# Patient Record
Sex: Male | Born: 1945 | Race: White | Hispanic: No | Marital: Married | Smoking: Current every day smoker
Health system: Southern US, Community
[De-identification: ages and names within clinical notes are randomized; demographics above are authoritative.]

## PROBLEM LIST (undated history)

## (undated) DIAGNOSIS — E785 Hyperlipidemia, unspecified: Secondary | ICD-10-CM

## (undated) DIAGNOSIS — I82409 Acute embolism and thrombosis of unspecified deep veins of unspecified lower extremity: Secondary | ICD-10-CM

## (undated) DIAGNOSIS — I10 Essential (primary) hypertension: Secondary | ICD-10-CM

## (undated) HISTORY — DX: Essential (primary) hypertension: I10

## (undated) HISTORY — DX: Acute embolism and thrombosis of unspecified deep veins of unspecified lower extremity: I82.409

## (undated) HISTORY — DX: Hyperlipidemia, unspecified: E78.5

---

## 1998-10-24 ENCOUNTER — Emergency Department (HOSPITAL_COMMUNITY): Admission: EM | Admit: 1998-10-24 | Discharge: 1998-10-24 | Payer: Self-pay

## 1998-10-31 ENCOUNTER — Emergency Department (HOSPITAL_COMMUNITY): Admission: EM | Admit: 1998-10-31 | Discharge: 1998-10-31 | Payer: Self-pay | Admitting: Emergency Medicine

## 2006-03-28 ENCOUNTER — Ambulatory Visit: Payer: Self-pay | Admitting: Family Medicine

## 2011-06-02 ENCOUNTER — Ambulatory Visit
Admission: RE | Admit: 2011-06-02 | Discharge: 2011-06-02 | Disposition: A | Discharge status: Home / Self Care | Payer: Medicare Other | Source: Ambulatory Visit | Attending: Family Medicine | Admitting: Family Medicine

## 2011-06-02 ENCOUNTER — Other Ambulatory Visit: Payer: Self-pay | Admitting: Family Medicine

## 2011-06-02 DIAGNOSIS — M79669 Pain in unspecified lower leg: Secondary | ICD-10-CM

## 2011-10-13 ENCOUNTER — Other Ambulatory Visit: Payer: Self-pay | Admitting: Family Medicine

## 2011-10-13 DIAGNOSIS — I824Z9 Acute embolism and thrombosis of unspecified deep veins of unspecified distal lower extremity: Secondary | ICD-10-CM

## 2011-10-25 ENCOUNTER — Ambulatory Visit
Admission: RE | Admit: 2011-10-25 | Discharge: 2011-10-25 | Disposition: A | Discharge status: Home / Self Care | Payer: Medicare Other | Source: Ambulatory Visit | Attending: Family Medicine | Admitting: Family Medicine

## 2011-10-25 DIAGNOSIS — I824Z9 Acute embolism and thrombosis of unspecified deep veins of unspecified distal lower extremity: Secondary | ICD-10-CM

## 2012-09-21 ENCOUNTER — Telehealth: Payer: Self-pay

## 2012-09-21 NOTE — Telephone Encounter (Signed)
BETH FROM DR LITTLE'S OFFICE WILL CALL PT WITH APPT.

## 2012-10-04 ENCOUNTER — Telehealth: Payer: Self-pay | Admitting: *Deleted

## 2012-10-04 ENCOUNTER — Telehealth: Payer: Self-pay | Admitting: Hematology and Oncology

## 2012-10-04 ENCOUNTER — Other Ambulatory Visit (HOSPITAL_BASED_OUTPATIENT_CLINIC_OR_DEPARTMENT_OTHER): Payer: Medicare Other | Admitting: Lab

## 2012-10-04 ENCOUNTER — Ambulatory Visit (HOSPITAL_BASED_OUTPATIENT_CLINIC_OR_DEPARTMENT_OTHER): Payer: Medicare Other | Admitting: Hematology and Oncology

## 2012-10-04 ENCOUNTER — Ambulatory Visit: Payer: Medicare Other

## 2012-10-04 ENCOUNTER — Encounter: Payer: Self-pay | Admitting: Hematology and Oncology

## 2012-10-04 VITALS — BP 153/84 | HR 81 | Temp 97.0°F | Resp 18 | Ht 66.0 in | Wt 130.0 lb

## 2012-10-04 DIAGNOSIS — Z87891 Personal history of nicotine dependence: Secondary | ICD-10-CM

## 2012-10-04 DIAGNOSIS — D45 Polycythemia vera: Secondary | ICD-10-CM

## 2012-10-04 DIAGNOSIS — D751 Secondary polycythemia: Secondary | ICD-10-CM

## 2012-10-04 DIAGNOSIS — Z86718 Personal history of other venous thrombosis and embolism: Secondary | ICD-10-CM

## 2012-10-04 LAB — CBC WITH DIFFERENTIAL/PLATELET
Basophils Absolute: 0.1 10*3/uL (ref 0.0–0.1)
Eosinophils Absolute: 0.3 10*3/uL (ref 0.0–0.5)
HGB: 19.2 g/dL — ABNORMAL HIGH (ref 13.0–17.1)
MCV: 99.7 fL — ABNORMAL HIGH (ref 79.3–98.0)
MONO%: 6.1 % (ref 0.0–14.0)
NEUT#: 5.5 10*3/uL (ref 1.5–6.5)
RDW: 14 % (ref 11.0–14.6)
lymph#: 2.8 10*3/uL (ref 0.9–3.3)

## 2012-10-04 LAB — URINALYSIS, MICROSCOPIC - CHCC
Glucose: NEGATIVE mg/dL
Ketones: NEGATIVE mg/dL
RBC / HPF: NEGATIVE (ref 0–2)
Specific Gravity, Urine: 1.005 (ref 1.003–1.035)
WBC, UA: NEGATIVE (ref 0–2)

## 2012-10-04 NOTE — Progress Notes (Signed)
Checked in new patient. No financial issues. Wants phone and mail only. Didn't ask if living will/POV.

## 2012-10-04 NOTE — Telephone Encounter (Signed)
Per staff message and POF I have scheduled appts.  Isaiah York  

## 2012-10-04 NOTE — Telephone Encounter (Signed)
gv and printed appt sched and avs for pt....emailed MW to add tx...will call pt with d/t

## 2012-10-04 NOTE — Telephone Encounter (Signed)
lvm for pt regarding to 8.12.14 d/t for appt.

## 2012-10-05 LAB — ERYTHROPOIETIN: Erythropoietin: 3.1 m[IU]/mL (ref 2.6–18.5)

## 2012-10-07 NOTE — Progress Notes (Signed)
ID: Isaiah York OB: 01/19/1946  MR#: [id number]  WUJ#:[id number]     INITIAL HEMATOLOGY CONSULTATION    PCP: Mickie Hillier, MD  Reason for Referral: Polycythemia.   HISTORY OF PRESENT ILLNESS: The patient is a 67 y.o. male who was sent to [place] for evaluation of polycythemia. On 09/14/2012 his hemoglobin was 20.5, hematocrit was 56.8. The patient is smoker. He smokes 1/2 PPD for 54 years. He has good appetite. His weight is stable He reported history headaches but when he started to control blood pressure it is gone. The patient denied fever, chills, night sweats, change in appetite or weight.He denied headaches since his blood pressure under control, double vision, blurry vision, nasal congestion, nasal discharge, hearing problems, odynophagia or dysphagia. No chest pain, palpitations, dyspnea, cough, abdominal pain, nausea, vomiting, diarrhea, constipation, hematochezia. The patient denied dysuria, nocturia, polyuria, hematuria, myalgia, numbness, tingling, psychiatric problems.  Review of Systems  Constitutional: Negative for fever, chills, weight loss, malaise/fatigue and diaphoresis.  HENT: Negative for hearing loss, ear pain, nosebleeds, congestion, sore throat, neck pain, tinnitus and ear discharge.   Eyes: Negative for blurred vision, double vision, photophobia and pain.  Respiratory: Negative for cough, hemoptysis, sputum production, shortness of breath, wheezing and stridor.   Cardiovascular: Negative for chest pain, palpitations, orthopnea, claudication, leg swelling and PND.  Gastrointestinal: Negative for heartburn, nausea, vomiting, abdominal pain, diarrhea, constipation, blood in stool and melena.  Genitourinary: Negative for dysuria, urgency, frequency, hematuria and flank pain.  Musculoskeletal: Negative for myalgias, back pain, joint pain and falls.  Skin: Negative for itching and rash.  Neurological: Negative for dizziness, tingling, tremors, sensory change, speech  change, focal weakness, seizures, loss of consciousness, weakness and headaches.  Endo/Heme/Allergies: Negative for polydipsia. Does not bruise/bleed easily.  Psychiatric/Behavioral: Negative for depression.    PAST MEDICAL HISTORY: 1. Hypertension. 2. DVT.  PAST SURGICAL HISTORY: 1. Cataract surgery   FAMILY HISTORY Mother died from lung cancer.. Father had history of CVA. He died from throat cancer.  Sister doing well.  HEALTH MAINTENANCE: Smoking: 1/2 PPD for 54 year. Alcohol: used  to drink 4 beers a day. No drugs.  No Known Allergies  Current Outpatient Prescriptions  Medication Sig Dispense Refill  . amLODipine (NORVASC) 5 MG tablet Take 5 mg by mouth daily.      . folic acid (FOLVITE) 1 MG tablet Take 1 mg by mouth daily.      Marland Kitchen losartan (COZAAR) 50 MG tablet Take 50 mg by mouth daily.      Marland Kitchen warfarin (COUMADIN) 2 MG tablet Take 3 mg by mouth. 1.5 tabs daily except Wednesdays 1 tab.       No current facility-administered medications for this visit.    OBJECTIVE: Filed Vitals:   10/04/12 1100  BP: 153/84  Pulse: 81  Temp: 97 F (36.1 C)  Resp: 18     Body mass index is 20.99 kg/(m^2).    ECOG FS: 0  HEENT: Sclerae anicteric.  Conjunctivae were pink. Pupils round and reactive bilaterally. Oral mucosa is moist without ulceration or thrush. No occipital, submandibular, cervical, supraclavicular or axillar adenopathy. Lungs: clear to auscultation without wheezes. No rales or rhonchi. Heart: regular rate and rhythm. No gallop or rubs. 3/6 early short systolic murmur.  Abdomen: soft, non tender. No guarding or rebound tenderness. Bowel sounds are present. No palpable hepatosplenomegaly. MSK: no focal spinal tenderness. Extremities: No clubbing or cyanosis.No calf tenderness to palpitation, no peripheral edema. The patient had grossly intact strength in upper and  lower extremities Neuro: non-focal, alert and oriented to time, person and place, appropriate affect  LAB  RESULTS:  Lab Results  Component Value Date   WBC 9.2 10/04/2012   NEUTROABS 5.5 10/04/2012   HGB 19.2 Repeated and Verified* 10/04/2012   HCT 57.3* 10/04/2012   MCV 99.7* 10/04/2012   PLT 277 10/04/2012    Urinalysis    Component Value Date/Time   LABSPEC 1.005 10/04/2012 1309   GLUCOSEU Negative 10/04/2012 1309   UROBILINOGEN 0.2 10/04/2012 1309     ASSESSMENT:  1. Polycythemia. 2. Tobacco abuse. 3. DVT.  PLAN: 1. We discussed with patient that his polycythemia can be secondary to smoking and recommended to stop smoking. The patient agree to try stop smoking prior we proceed with further work up for other causes of polycythemia as polycythemia vera. Meantime I recommended to start aspirin 81 mg and phlebotomies. We will check CBC now and in 2 weeks. I will follow patient in 6-8 weeks. 2. 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 I will try to confirm history of multiple DVT.   Myra Rude, MD   10/07/2012 11:51 PM

## 2012-10-16 ENCOUNTER — Ambulatory Visit (HOSPITAL_BASED_OUTPATIENT_CLINIC_OR_DEPARTMENT_OTHER): Payer: Medicare Other

## 2012-10-16 ENCOUNTER — Telehealth: Payer: Self-pay | Admitting: *Deleted

## 2012-10-16 ENCOUNTER — Ambulatory Visit (HOSPITAL_BASED_OUTPATIENT_CLINIC_OR_DEPARTMENT_OTHER): Payer: Medicare Other | Admitting: Lab

## 2012-10-16 VITALS — BP 144/70 | HR 84 | Temp 97.8°F

## 2012-10-16 DIAGNOSIS — D751 Secondary polycythemia: Secondary | ICD-10-CM

## 2012-10-16 DIAGNOSIS — D45 Polycythemia vera: Secondary | ICD-10-CM

## 2012-10-16 LAB — CBC WITH DIFFERENTIAL/PLATELET
Basophils Absolute: 0.1 10*3/uL (ref 0.0–0.1)
Eosinophils Absolute: 0.6 10*3/uL — ABNORMAL HIGH (ref 0.0–0.5)
HGB: 17.3 g/dL — ABNORMAL HIGH (ref 13.0–17.1)
NEUT#: 6.9 10*3/uL — ABNORMAL HIGH (ref 1.5–6.5)
RDW: 13.7 % (ref 11.0–14.6)
WBC: 11.2 10*3/uL — ABNORMAL HIGH (ref 4.0–10.3)
lymph#: 2.8 10*3/uL (ref 0.9–3.3)

## 2012-10-16 NOTE — Telephone Encounter (Signed)
Per chemo RN I have scheduled appt to next week.

## 2012-10-16 NOTE — Patient Instructions (Addendum)
Therapeutic Phlebotomy Therapeutic phlebotomy is the controlled removal of blood from your body for the purpose of treating a medical condition. It is similar to donating blood. Usually, about a pint (470 mL) of blood is removed. The average adult has 9 to 12 pints (4.3 to 5.7 L) of blood. Therapeutic phlebotomy may be used to treat the following medical conditions:  Hemochromatosis. This is a condition in which there is too much iron in the blood.  Polycythemia vera. This is a condition in which there are too many red cells in the blood.  Porphyria cutanea tarda. This is a disease usually passed from one generation to the next (inherited). It is a condition in which an important part of hemoglobin is not made properly. This results in the build up of abnormal amounts of porphyrins in the body.  Sickle cell disease. This is an inherited disease. It is a condition in which the red blood cells form an abnormal crescent shape rather than a round shape. LET YOUR CAREGIVER KNOW ABOUT:  Allergies.  Medicines taken including herbs, eyedrops, over-the-counter medicines, and creams.  Use of steroids (by mouth or creams).  Previous problems with anesthetics or numbing medicine.  History of blood clots.  History of bleeding or blood problems.  Previous surgery.  Possibility of pregnancy, if this applies. RISKS AND COMPLICATIONS This is a simple and safe procedure. Problems are unlikely. However, problems can occur and may include:  Nausea or lightheadedness.  Low blood pressure.  Soreness, bleeding, swelling, or bruising at the needle insertion site.  Infection. BEFORE THE PROCEDURE  This is a procedure that can be done as an outpatient. Confirm the time that you need to arrive for your procedure. Confirm whether there is a need to fast or withhold any medications. It is helpful to wear clothing with sleeves that can be raised above the elbow. A blood sample may be done to determine the  amount of red blood cells or iron in your blood. Plan ahead of time to have someone drive you home after the procedure. PROCEDURE The entire procedure from preparation through recovery takes about 1 hour. The actual collection takes about 10 to 15 minutes.  A needle will be inserted into your vein.  Tubing and a collection bag will be attached to that needle.  Blood will flow through the needle and tubing into the collection bag.  You may be asked to open and close your hand slowly and continuously during the entire collection.  Once the specified amount of blood has been removed from your body, the collection bag and tubing will be clamped.  The needle will be removed.  Pressure will be held on the site of the needle insertion to stop the bleeding. Then a bandage will be placed over the needle insertion site. AFTER THE PROCEDURE  Your recovery will be assessed and monitored. If there are no problems, as an outpatient, you should be able to go home shortly after the procedure.  Document Released: 07/26/2010 Document Revised: 05/16/2011 Document Reviewed: 07/26/2010 [employer] Patient Information 2014 [employer].  

## 2012-10-16 NOTE — Progress Notes (Signed)
Patient has poor venous access.  Right antecubital accessed today for lab.  Patient has significant bruising in this area from injury.  Patient also has significant bruising and large scabs from previous lab draw and tape exposure (per patient) in left arm.. Phlebotomy done today in right forearm only yielded 150cc after 20 minutes.  Patient willing to return next week to complete phlebotomy. Discussed with Nathaniel Man RN with Dr. Alecia Lemming.  Gave patient and dtr. Instructions for good fluid intake today and  before next phlebotomy.  Patient will return next week to finish today's phlebotomy. Patient left with daughter.  He feels fine.

## 2012-10-23 ENCOUNTER — Ambulatory Visit (HOSPITAL_BASED_OUTPATIENT_CLINIC_OR_DEPARTMENT_OTHER): Payer: Medicare Other

## 2012-10-23 VITALS — BP 125/90 | HR 89 | Temp 96.9°F | Resp 20

## 2012-10-23 DIAGNOSIS — D45 Polycythemia vera: Secondary | ICD-10-CM

## 2012-10-23 NOTE — Progress Notes (Signed)
0845 patient states he has not had breakfast this morning prior to phlebotomy; patient currently eating cheese and crackers and drinking juice. VSS. Clayborn Heron, RN

## 2012-10-23 NOTE — Progress Notes (Signed)
VSS patient denies lightheadedness or dizziness; monitored x30 minutes post phlebotomy; food and drink provided; patient ambulated to exit without difficulty. Clayborn Heron, RN

## 2012-10-23 NOTE — Patient Instructions (Addendum)
Therapeutic Phlebotomy Therapeutic phlebotomy is the controlled removal of blood from your body for the purpose of treating a medical condition. It is similar to donating blood. Usually, about a pint (470 mL) of blood is removed. The average adult has 9 to 12 pints (4.3 to 5.7 L) of blood. Therapeutic phlebotomy may be used to treat the following medical conditions:  Hemochromatosis. This is a condition in which there is too much iron in the blood.  Polycythemia vera. This is a condition in which there are too many red cells in the blood.  Porphyria cutanea tarda. This is a disease usually passed from one generation to the next (inherited). It is a condition in which an important part of hemoglobin is not made properly. This results in the build up of abnormal amounts of porphyrins in the body.  Sickle cell disease. This is an inherited disease. It is a condition in which the red blood cells form an abnormal crescent shape rather than a round shape. LET YOUR CAREGIVER KNOW ABOUT:  Allergies.  Medicines taken including herbs, eyedrops, over-the-counter medicines, and creams.  Use of steroids (by mouth or creams).  Previous problems with anesthetics or numbing medicine.  History of blood clots.  History of bleeding or blood problems.  Previous surgery.  Possibility of pregnancy, if this applies. RISKS AND COMPLICATIONS This is a simple and safe procedure. Problems are unlikely. However, problems can occur and may include:  Nausea or lightheadedness.  Low blood pressure.  Soreness, bleeding, swelling, or bruising at the needle insertion site.  Infection. BEFORE THE PROCEDURE  This is a procedure that can be done as an outpatient. Confirm the time that you need to arrive for your procedure. Confirm whether there is a need to fast or withhold any medications. It is helpful to wear clothing with sleeves that can be raised above the elbow. A blood sample may be done to determine the  amount of red blood cells or iron in your blood. Plan ahead of time to have someone drive you home after the procedure. PROCEDURE The entire procedure from preparation through recovery takes about 1 hour. The actual collection takes about 10 to 15 minutes.  A needle will be inserted into your vein.  Tubing and a collection bag will be attached to that needle.  Blood will flow through the needle and tubing into the collection bag.  You may be asked to open and close your hand slowly and continuously during the entire collection.  Once the specified amount of blood has been removed from your body, the collection bag and tubing will be clamped.  The needle will be removed.  Pressure will be held on the site of the needle insertion to stop the bleeding. Then a bandage will be placed over the needle insertion site. AFTER THE PROCEDURE  Your recovery will be assessed and monitored. If there are no problems, as an outpatient, you should be able to go home shortly after the procedure.  Document Released: 07/26/2010 Document Revised: 05/16/2011 Document Reviewed: 07/26/2010 [employer] Patient Information 2014 [employer].  

## 2012-11-08 ENCOUNTER — Telehealth: Payer: Self-pay | Admitting: Internal Medicine

## 2012-11-08 NOTE — Telephone Encounter (Signed)
Moved 9/11 appt to 9/23 w/CP1. S/w dtr she is aware.

## 2012-11-15 ENCOUNTER — Ambulatory Visit: Payer: Medicare Other

## 2012-11-27 ENCOUNTER — Telehealth: Payer: Self-pay | Admitting: *Deleted

## 2012-11-27 ENCOUNTER — Ambulatory Visit (HOSPITAL_BASED_OUTPATIENT_CLINIC_OR_DEPARTMENT_OTHER): Payer: Medicare Other | Admitting: Lab

## 2012-11-27 ENCOUNTER — Encounter: Payer: Self-pay | Admitting: Internal Medicine

## 2012-11-27 ENCOUNTER — Ambulatory Visit (HOSPITAL_BASED_OUTPATIENT_CLINIC_OR_DEPARTMENT_OTHER): Payer: Medicare Other | Admitting: Internal Medicine

## 2012-11-27 VITALS — BP 155/82 | HR 79 | Temp 97.0°F | Resp 20 | Ht 66.0 in | Wt 134.0 lb

## 2012-11-27 DIAGNOSIS — I82401 Acute embolism and thrombosis of unspecified deep veins of right lower extremity: Secondary | ICD-10-CM

## 2012-11-27 DIAGNOSIS — D751 Secondary polycythemia: Secondary | ICD-10-CM

## 2012-11-27 DIAGNOSIS — F17200 Nicotine dependence, unspecified, uncomplicated: Secondary | ICD-10-CM

## 2012-11-27 DIAGNOSIS — Z720 Tobacco use: Secondary | ICD-10-CM

## 2012-11-27 DIAGNOSIS — I82409 Acute embolism and thrombosis of unspecified deep veins of unspecified lower extremity: Secondary | ICD-10-CM

## 2012-11-27 LAB — COMPREHENSIVE METABOLIC PANEL (CC13)
ALT: 14 U/L (ref 0–55)
CO2: 24 mEq/L (ref 22–29)
Calcium: 9.8 mg/dL (ref 8.4–10.4)
Chloride: 105 mEq/L (ref 98–109)
Creatinine: 0.9 mg/dL (ref 0.7–1.3)
Glucose: 132 mg/dl (ref 70–140)
Sodium: 140 mEq/L (ref 136–145)
Total Protein: 8.3 g/dL (ref 6.4–8.3)

## 2012-11-27 LAB — CBC WITH DIFFERENTIAL/PLATELET
BASO%: 0.7 % (ref 0.0–2.0)
Eosinophils Absolute: 0.2 10*3/uL (ref 0.0–0.5)
HCT: 49.3 % (ref 38.4–49.9)
MCHC: 33.5 g/dL (ref 32.0–36.0)
MONO#: 0.8 10*3/uL (ref 0.1–0.9)
NEUT#: 5.7 10*3/uL (ref 1.5–6.5)
NEUT%: 62.9 % (ref 39.0–75.0)
Platelets: 271 10*3/uL (ref 140–400)
RBC: 4.93 10*6/uL (ref 4.20–5.82)
RDW: 14.9 % — ABNORMAL HIGH (ref 11.0–14.6)
WBC: 9 10*3/uL (ref 4.0–10.3)
lymph#: 2.4 10*3/uL (ref 0.9–3.3)

## 2012-11-27 NOTE — Telephone Encounter (Signed)
Per staff message and POF I have scheduled appts.  JMW  

## 2012-11-27 NOTE — Patient Instructions (Signed)
Warfarin tablets What is this medicine? WARFARIN (WAR far in) is an anticoagulant. It is used to treat or prevent clots in the veins, arteries, lungs, or heart. This medicine may be used for other purposes; ask your health care provider or pharmacist if you have questions. What should I tell my health care provider before I take this medicine? They need to know if you have any of these conditions: -alcoholism -anemia -bleeding disorders -cancer -diabetes -heart disease -high blood pressure -history of bleeding in the gastrointestinal tract -history of stroke or other brain injury or disease -kidney or liver disease -protein C deficiency -protein S deficiency -psychosis or dementia -recent injury, recent or planned surgery or procedure -an unusual or allergic reaction to warfarin, other medicines, foods, dyes, or preservatives -pregnant or trying to get pregnant -breast-feeding How should I use this medicine? Take this medicine by mouth with a glass of water. Follow the directions on the prescription label. You can take this medicine with or without food. Take your medicine at the same time each day. Do not take it more often than directed. Do not stop taking except on the advice of your doctor or health care professional. A special MedGuide will be given to you by the pharmacist with each prescription and refill. Be sure to read this information carefully each time. Talk to your pediatrician regarding the use of this medicine in children. Special care may be needed. Overdosage: If you think you have taken too much of this medicine contact a poison control center or emergency room at once. NOTE: This medicine is only for you. Do not share this medicine with others. What if I miss a dose? It is important not to miss a dose. If you miss a dose, call your healthcare provider. Take the dose as soon as possible on the same day. If it is almost time for your next dose, take only that dose. Do  not take double or extra doses to make up for a missed dose. What may interact with this medicine? Do not take this medicine with any of the following medications: -agents that prevent or dissolve blood clots -aspirin or other salicylates -danshen -dextrothyroxine -mifepristone -St. John's Wort -red yeast rice This medicine may also interact with the following medications: -acetaminophen -agents that lower cholesterol -alcohol -allopurinol -amiodarone -antibiotics or medicines for treating bacterial, fungal or viral infections -azathioprine -barbiturate medicines for inducing sleep or treating seizures -certain medicines for diabetes -certain medicines for heart rhythm problems -certain medicines for high blood pressure -chloral hydrate -cisapride -disulfiram -male hormones, including contraceptive or birth control pills -general anesthetics -herbal or dietary products like cranberry, garlic, ginkgo, ginseng, green tea, or kava kava -influenza virus vaccine -male hormones -medicines for mental depression or psychosis -medicines for some types of cancer -medicines for stomach problems -methylphenidate -NSAIDs, medicines for pain and inflammation, like ibuprofen or naproxen -propoxyphene -quinidine, quinine -raloxifene -seizure or epilepsy medicine like carbamazepine, phenytoin, and valproic acid -steroids like cortisone and prednisone -tamoxifen -thyroid medicine -tramadol -vitamin c, vitamin e, and vitamin K -zafirlukast -zileuton This list may not describe all possible interactions. Give your health care provider a list of all the medicines, herbs, non-prescription drugs, or dietary supplements you use. Also tell them if you smoke, drink alcohol, or use illegal drugs. Some items may interact with your medicine. What should I watch for while using this medicine? Visit your doctor or health care professional for regular checks on your progress. You will need to have  your blood   checked regularly to make sure you are getting the right dose of this medicine. When you first start taking this medicine, these tests are done often. Once the correct dose is determined and you take your medicine properly, these tests can be done less often. While you are taking this medicine, carry an identification card with your name, the name and dose of medicine(s) being used, and the name and phone number of your doctor or health care professional or person to contact in an emergency. Do not start taking or stop taking any medicines or over-the-counter medicines except on the advice of your doctor or health care professional. You should discuss your diet with your doctor or health care professional. Do not make major changes in your diet. Many foods contain high amounts of vitamin K, which can interfere with the effect of this medicine. Your doctor or health care professional may want you to limit your intake of foods that contain vitamin K. Some foods that have moderate to high amounts of vitamin K are green leafy vegetables like beet greens, collard greens, endive, kale, mustard greens, spinach, turnip greens, watercress, and certain lettuces like green leaf or romaine. Some other foods that have high to moderate amounts of vitamin K are asparagus, black eye peas, broccoli, brussels sprouts, cabbage, cucumber with peel, okra, peas, parsley, and green onions. This medicine can cause birth defects or bleeding in an unborn child. Women of childbearing age should use effective birth control while taking this medicine. If a woman becomes pregnant while taking this medicine, she should discuss the potential risks and her options with her health care professional. Avoid sports and activities that might cause injury while you are using this medicine. Severe falls or injuries can cause unseen bleeding. Be careful when using sharp tools or knives. Consider using an Neurosurgeon. Take special care  brushing or flossing your teeth. Report any injuries, bruising, or red spots on the skin to your doctor or health care professional. If you have an illness that causes vomiting, diarrhea, or fever for more than a few days, contact your doctor. Also check with your doctor if you are unable to eat for several days. These problems can change the effect of this medicine. Even after you stop taking this medicine, it takes several days before your body recovers its normal ability to clot blood. Ask your doctor or health care professional how long you need to be careful. If you are going to have surgery or dental work, tell your doctor or health care professional that you have been taking this medicine. What side effects may I notice from receiving this medicine? Side effects that you should report to your doctor or health care professional as soon as possible: -back or stomach pain -chest pain or fast or irregular heartbeat -difficulty breathing or talking, wheezing -dizziness -fever or chills -headaches -heavy menstrual bleeding or vaginal bleeding -nausea, vomiting -painful, blue, or purple toes -painful, prolonged erection -signs and symptoms of bleeding such as bloody or black, tarry stools, red or dark-brown urine, spitting up blood or brown material that looks like coffee grounds, red spots on the skin, unusual bruising or bleeding from the eye, gums, or nose -skin rash, itching or skin damage -unusual swelling or sudden weight gain -unusually weak or tired -yellowing of skin or eyes Side effects that usually do not require medical attention (report to your doctor or health care professional if they continue or are bothersome): -diarrhea -unusual hair loss This list may not  describe all possible side effects. Call your doctor for medical advice about side effects. You may report side effects to FDA at 1-800-FDA-1088. Where should I keep my medicine? Keep out of the reach of children. Store  at room temperature between 15 and 30 degrees C (59 and 86 degrees F). Protect from light. Throw away any unused medicine after the expiration date. NOTE: This sheet is a summary. It may not cover all possible information. If you have questions about this medicine, talk to your doctor, pharmacist, or health care provider.  2013, [employer]. (01/20/2011 12:08:27 PM) Polycythemia Vera  Polycythemia Dwana Curd is a condition in which the body makes too many red blood cells and there is no known cause. The red blood cells (erythrocytes) are the cells which carry the oxygen in your blood stream to the cells of your body. Because of the increased red blood cells, the blood becomes thicker and does not circulate as well. It would be similar to your car having oil which is too thick so it cannot start and circulate as well. When the blood is too thick it often causes headaches and dizziness. It may also cause blood clots. Even though the blood clots easier, these patients bleed easier. The bleeding is caused because the blood cells which help stop bleeding (platelets) do not function normally. It occurs in all age groups but is more common in the 72 to 15 year age range. TREATMENT  The treatment of polycythemia vera for many years has been blood removal (phlebotomy) which is similar to blood removal in a blood bank, however this blood is not used for donation. Hydroxyurea is used to supplement phlebotomy. Aspirin is commonly given to thin the blood as long as the patient does not have a problem with bleeding. Other drugs are used based on the progression of the disease. Document Released: 11/16/2000 Document Revised: 05/16/2011 Document Reviewed: 05/23/2008 [facility] Patient Information 2014 [place], [place].

## 2012-11-27 NOTE — Progress Notes (Signed)
[facility] OFFICE PROGRESS NOTE  Mickie Hillier, MD [address] [place] [address]  DIAGNOSIS: Polycythemia, secondary - Plan: CBC with Differential, Comprehensive metabolic panel, CBC with Differential in 1 month, CBC with Differential in 2 months, Comprehensive metabolic panel, [id number]  Tobacco abuse - Plan: CBC with Differential in 1 month, CBC with Differential in 2 months, Comprehensive metabolic panel, [id number]  Acute venous embolism and thrombosis of unspecified deep vessels of lower extremity  DVT, lower extremity, right  Chief Complaint  Patient presents with  . Polycythemia    CURRENT THERAPY:  Phlebotomy to maintain a hemoglobin less than 45%.   INTERVAL HISTORY: STEFAN MARKARIAN 67 y.o. male  who was initially sent to Dr. Roanna Raider on 10/04/2012 for evaluation of polycythemia. Today he is accompanied by his daughter.   On 09/14/2012 his hemoglobin was 20.5, hematocrit was 56.8. The patient is smoker. He smokes 1/2 PPD for 54 years. He has good appetite. His weight is stable He reported history headaches but when he started to control blood pressure it is gone. The patient denied fever, chills, night sweats, change in appetite or weight.He denied headaches since his blood pressure under control, double vision, blurry vision, nasal congestion, nasal discharge, hearing problems, odynophagia or dysphagia. No chest pain, palpitations, dyspnea, cough, abdominal pain, nausea, vomiting, diarrhea, constipation, hematochezia. The patient denied dysuria, nocturia, polyuria, hematuria, myalgia, numbness, tingling, psychiatric problems. He denies pruritus or headaches following his showers.    MEDICAL HISTORY: Past Medical History  Diagnosis Date  . HTN (hypertension)     INTERIM HISTORY: has Polycythemia, secondary; Tobacco abuse; and DVT, lower extremity on his problem list.    ALLERGIES:  is allergic to tape.  MEDICATIONS: has a current medication  list which includes the following prescription(s): amlodipine, folic acid, losartan, and warfarin.  SURGICAL HISTORY: History reviewed. No pertinent past surgical history.  REVIEW OF SYSTEMS:   Constitutional: Denies fevers, chills or abnormal weight loss Eyes: Denies blurriness of vision Ears, nose, mouth, throat, and face: Denies mucositis or sore throat Respiratory: Denies cough, dyspnea or wheezes Cardiovascular: Denies palpitation, chest discomfort or lower extremity swelling Gastrointestinal:  Denies nausea, heartburn or change in bowel habits Skin: Denies abnormal skin rashes Lymphatics: Denies new lymphadenopathy or easy bruising Neurological:Denies numbness, tingling or new weaknesses Behavioral/Psych: Mood is stable, no new changes  All other systems were reviewed with the patient and are negative.  PHYSICAL EXAMINATION: ECOG PERFORMANCE STATUS: 0 - Asymptomatic  Blood pressure 155/82, pulse 79, temperature 97 F (36.1 C), temperature source Oral, resp. rate 20, height 5\' 6"  (1.676 m), weight 134 lb (60.782 kg).  GENERAL:alert, no distress and comfortable elderly who appears his stated age SKIN: skin color, texture, turgor are normal, no rashes or significant lesions EYES: normal, Conjunctiva are pink and non-injected, sclera clear OROPHARYNX:no exudate, no erythema and lips, buccal mucosa, and tongue normal  NECK: supple, thyroid normal size, non-tender, without nodularity LYMPH:  no palpable lymphadenopathy in the cervical, axillary or inguinal LUNGS: clear to auscultation and percussion with normal breathing effort HEART: regular rate & rhythm and no murmurs and no lower extremity edema ABDOMEN:abdomen soft, non-tender and normal bowel sounds Musculoskeletal:no cyanosis of digits and no clubbing  NEURO: alert & oriented x 3 with fluent speech, no focal motor/sensory deficits   LABORATORY DATA: CBC    Component Value Date/Time   WBC 9.0 11/27/2012 1259   RBC 4.93  11/27/2012 1259   HGB 16.6 11/27/2012 1259   HCT 49.3  11/27/2012 1259   PLT 271 11/27/2012 1259   MCV 100.0* 11/27/2012 1259   MCH 33.6* 11/27/2012 1259   MCHC 33.5 11/27/2012 1259   RDW 14.9* 11/27/2012 1259   LYMPHSABS 2.4 11/27/2012 1259   MONOABS 0.8 11/27/2012 1259   EOSABS 0.2 11/27/2012 1259   BASOSABS 0.1 11/27/2012 1259   CMP     Component Value Date/Time   NA 140 11/27/2012 1259   K 4.5 11/27/2012 1259   CO2 24 11/27/2012 1259   GLUCOSE 132 11/27/2012 1259   BUN 10.6 11/27/2012 1259   CREATININE 0.9 11/27/2012 1259   CALCIUM 9.8 11/27/2012 1259   PROT 8.3 11/27/2012 1259   ALBUMIN 4.4 11/27/2012 1259   AST 21 11/27/2012 1259   ALT 14 11/27/2012 1259   ALKPHOS 68 11/27/2012 1259   BILITOT 0.72 11/27/2012 1259    RADIOGRAPHIC STUDIES: RIGHT LOWER EXTREMITY VENOUS DUPLEX ULTRASOUND  (10/25/2011) Technique: Gray-scale sonography with graded compression, as well as color Doppler and duplex ultrasound, were performed to evaluate  the deep venous system of the lower extremity from the level of the common femoral vein through the popliteal and proximal calf veins. Spectral Doppler was utilized to evaluate flow at rest and with distal augmentation maneuvers.  Comparison: 06/02/2011  Findings: Residual echogenic intraluminal thrombus noted in the superficial femoral and popliteal veins. Partially occlusive  thrombus persist at the saphenofemoral junction extending into the great saphenous vein. Slight interval decrease in thrombus burden throughout the right lower extremity deep veins and the superficial saphenous vein. No occlusive DVT demonstrated on today's study.  IMPRESSION:  Residual partially occlusive right femoral popliteal DVT. Some interval improvement.  Residual superficial thrombosis of the great saphenous vein, little interval change.  Original Report Authenticated By: Judie Petit. Ruel Favors, M.D.    ASSESSMENT: 1) Tobacco abuse (longstanding); 2) Polycythemia; 3) Right lower extremity  DVT  PLAN:  1. We discussed with patient that his polycythemia can be secondary to smoking and recommended to stop smoking. The patient agree to reduce the amount smoking. He declined tobacco cessation referral presently.  We will proceed with further work up for other causes of polycythemia as polycythemia vera. JAK testing is pending and his EPO level is normal. He will continue coumadin for treatment for his DVT and phlebotomies prn HCT greater than or equal to 45% q monthly.  We provided a detailed overview of polycythemia and deep venous thrombosis.  A handout was also provided. He will return monthly for CBC and phlebotomies prn and a return office visit in 2 months.    All questions were answered. The patient knows to call the clinic with any problems, questions or concerns. We can certainly see the patient much sooner if necessary.  I spent 20 minutes counseling the patient face to face. The total time spent in the appointment was 40 minutes.    Keiva Dina, MD 11/28/2012 12:37 PM

## 2012-11-28 ENCOUNTER — Encounter: Payer: Self-pay | Admitting: Internal Medicine

## 2012-11-28 ENCOUNTER — Telehealth: Payer: Self-pay | Admitting: Internal Medicine

## 2012-11-28 NOTE — Telephone Encounter (Signed)
Called pt and left message regarding phelebotomy for October and November 2014

## 2012-12-28 ENCOUNTER — Other Ambulatory Visit (HOSPITAL_BASED_OUTPATIENT_CLINIC_OR_DEPARTMENT_OTHER): Payer: Medicare Other | Admitting: Lab

## 2012-12-28 ENCOUNTER — Ambulatory Visit (HOSPITAL_BASED_OUTPATIENT_CLINIC_OR_DEPARTMENT_OTHER): Payer: Medicare Other

## 2012-12-28 VITALS — BP 127/74 | HR 100 | Temp 97.1°F | Resp 20

## 2012-12-28 DIAGNOSIS — D751 Secondary polycythemia: Secondary | ICD-10-CM

## 2012-12-28 DIAGNOSIS — Z720 Tobacco use: Secondary | ICD-10-CM

## 2012-12-28 LAB — CBC WITH DIFFERENTIAL/PLATELET
BASO%: 0.2 % (ref 0.0–2.0)
Eosinophils Absolute: 0.5 10*3/uL (ref 0.0–0.5)
HCT: 47.4 % (ref 38.4–49.9)
HGB: 15.8 g/dL (ref 13.0–17.1)
MCHC: 33.4 g/dL (ref 32.0–36.0)
MCV: 100.2 fL — ABNORMAL HIGH (ref 79.3–98.0)
MONO#: 0.7 10*3/uL (ref 0.1–0.9)
NEUT#: 3.7 10*3/uL (ref 1.5–6.5)
NEUT%: 47.3 % (ref 39.0–75.0)
RBC: 4.73 10*6/uL (ref 4.20–5.82)
RDW: 14.5 % (ref 11.0–14.6)
WBC: 7.8 10*3/uL (ref 4.0–10.3)
lymph#: 2.9 10*3/uL (ref 0.9–3.3)

## 2012-12-28 NOTE — Progress Notes (Signed)
Therapeutic phlebotomy performed over 7 minutes. 504 ccs obtained. Pt tolerated well. Ate soup and drank Sprite after phlebotomy.

## 2012-12-28 NOTE — Patient Instructions (Signed)

## 2013-01-28 ENCOUNTER — Other Ambulatory Visit (HOSPITAL_BASED_OUTPATIENT_CLINIC_OR_DEPARTMENT_OTHER): Payer: Medicare Other | Admitting: Lab

## 2013-01-28 ENCOUNTER — Ambulatory Visit (HOSPITAL_BASED_OUTPATIENT_CLINIC_OR_DEPARTMENT_OTHER): Payer: Medicare Other | Admitting: Internal Medicine

## 2013-01-28 ENCOUNTER — Telehealth: Payer: Self-pay | Admitting: Internal Medicine

## 2013-01-28 ENCOUNTER — Ambulatory Visit (HOSPITAL_BASED_OUTPATIENT_CLINIC_OR_DEPARTMENT_OTHER): Payer: Medicare Other

## 2013-01-28 ENCOUNTER — Encounter: Payer: Self-pay | Admitting: Internal Medicine

## 2013-01-28 VITALS — BP 148/84 | HR 90 | Temp 97.6°F | Resp 18 | Ht 66.0 in | Wt 135.6 lb

## 2013-01-28 VITALS — BP 121/85 | HR 78 | Temp 97.1°F | Resp 20

## 2013-01-28 DIAGNOSIS — D751 Secondary polycythemia: Secondary | ICD-10-CM

## 2013-01-28 DIAGNOSIS — I82401 Acute embolism and thrombosis of unspecified deep veins of right lower extremity: Secondary | ICD-10-CM

## 2013-01-28 DIAGNOSIS — F17200 Nicotine dependence, unspecified, uncomplicated: Secondary | ICD-10-CM

## 2013-01-28 DIAGNOSIS — Z720 Tobacco use: Secondary | ICD-10-CM

## 2013-01-28 DIAGNOSIS — D45 Polycythemia vera: Secondary | ICD-10-CM

## 2013-01-28 DIAGNOSIS — I82409 Acute embolism and thrombosis of unspecified deep veins of unspecified lower extremity: Secondary | ICD-10-CM

## 2013-01-28 LAB — CBC WITH DIFFERENTIAL/PLATELET
BASO%: 0.6 % (ref 0.0–2.0)
EOS%: 1.9 % (ref 0.0–7.0)
HCT: 53.1 % — ABNORMAL HIGH (ref 38.4–49.9)
LYMPH%: 27.5 % (ref 14.0–49.0)
MCH: 34.2 pg — ABNORMAL HIGH (ref 27.2–33.4)
MCHC: 33.3 g/dL (ref 32.0–36.0)
MONO%: 9.9 % (ref 0.0–14.0)
NEUT%: 60.1 % (ref 39.0–75.0)
Platelets: 273 10*3/uL (ref 140–400)
RBC: 5.18 10*6/uL (ref 4.20–5.82)

## 2013-01-28 LAB — COMPREHENSIVE METABOLIC PANEL (CC13)
ALT: 14 U/L (ref 0–55)
AST: 21 U/L (ref 5–34)
Alkaline Phosphatase: 64 U/L (ref 40–150)
Anion Gap: 10 mEq/L (ref 3–11)
Creatinine: 0.9 mg/dL (ref 0.7–1.3)
Total Bilirubin: 0.43 mg/dL (ref 0.20–1.20)
Total Protein: 7.5 g/dL (ref 6.4–8.3)

## 2013-01-28 NOTE — Patient Instructions (Signed)
Therapeutic Phlebotomy Therapeutic phlebotomy is the controlled removal of blood from your body for the purpose of treating a medical condition. It is similar to donating blood. Usually, about a pint (470 mL) of blood is removed. The average adult has 9 to 12 pints (4.3 to 5.7 L) of blood. Therapeutic phlebotomy may be used to treat the following medical conditions:  Hemochromatosis. This is a condition in which there is too much iron in the blood.  Polycythemia vera. This is a condition in which there are too many red cells in the blood.  Porphyria cutanea tarda. This is a disease usually passed from one generation to the next (inherited). It is a condition in which an important part of hemoglobin is not made properly. This results in the build up of abnormal amounts of porphyrins in the body.  Sickle cell disease. This is an inherited disease. It is a condition in which the red blood cells form an abnormal crescent shape rather than a round shape. LET YOUR CAREGIVER KNOW ABOUT:  Allergies.  Medicines taken including herbs, eyedrops, over-the-counter medicines, and creams.  Use of steroids (by mouth or creams).  Previous problems with anesthetics or numbing medicine.  History of blood clots.  History of bleeding or blood problems.  Previous surgery.  Possibility of pregnancy, if this applies. RISKS AND COMPLICATIONS This is a simple and safe procedure. Problems are unlikely. However, problems can occur and may include:  Nausea or lightheadedness.  Low blood pressure.  Soreness, bleeding, swelling, or bruising at the needle insertion site.  Infection. BEFORE THE PROCEDURE  This is a procedure that can be done as an outpatient. Confirm the time that you need to arrive for your procedure. Confirm whether there is a need to fast or withhold any medications. It is helpful to wear clothing with sleeves that can be raised above the elbow. A blood sample may be done to determine the  amount of red blood cells or iron in your blood. Plan ahead of time to have someone drive you home after the procedure. PROCEDURE The entire procedure from preparation through recovery takes about 1 hour. The actual collection takes about 10 to 15 minutes.  A needle will be inserted into your vein.  Tubing and a collection bag will be attached to that needle.  Blood will flow through the needle and tubing into the collection bag.  You may be asked to open and close your hand slowly and continuously during the entire collection.  Once the specified amount of blood has been removed from your body, the collection bag and tubing will be clamped.  The needle will be removed.  Pressure will be held on the site of the needle insertion to stop the bleeding. Then a bandage will be placed over the needle insertion site. AFTER THE PROCEDURE  Your recovery will be assessed and monitored. If there are no problems, as an outpatient, you should be able to go home shortly after the procedure.  Document Released: 07/26/2010 Document Revised: 05/16/2011 Document Reviewed: 07/26/2010 [employer] Patient Information 2014 [employer].  

## 2013-01-28 NOTE — Telephone Encounter (Signed)
gv adn printed appt sched and avs for pt for Dec and Jan 2015  °

## 2013-01-28 NOTE — Progress Notes (Signed)
Phlebotomy performed in right lateral antecubital with 16G needle.  Approximate 50cc of blood obtained.  Blood clotted from needle.  Needle d/c intact, site unremarkable. Phlebotomy restarted in left posterior forearm with 18G x 1.16 in angiocath.  Approximate 241cc of blood obtained and wasted.  Again, blood clotted.  Dr. Rosie Fate notified.  Pt to return on 11/25 for another phlebotomy.  Explanations given to pt and daughter.  Both voiced understanding.  Pt was instructed to increase hydration at home as tolerated.

## 2013-01-29 ENCOUNTER — Ambulatory Visit (HOSPITAL_BASED_OUTPATIENT_CLINIC_OR_DEPARTMENT_OTHER): Payer: Medicare Other

## 2013-01-29 VITALS — BP 131/87 | HR 82 | Temp 97.9°F

## 2013-01-29 DIAGNOSIS — D751 Secondary polycythemia: Secondary | ICD-10-CM

## 2013-01-29 NOTE — Progress Notes (Signed)
[facility] OFFICE PROGRESS NOTE  Mickie Hillier, MD [address] [place] [address]  DIAGNOSIS: Polycythemia, secondary - Plan: CBC with Differential in 1 month, CBC with Differential in 2 months, Comprehensive metabolic panel  DVT, lower extremity, right  Tobacco abuse  Chief Complaint  Patient presents with  . Polycythemia, secondary  . DVT    CURRENT THERAPY:  Phlebotomy to maintain a hemoglobin less than 45%.   INTERVAL HISTORY: Isaiah York 67 y.o. male  who was initially sent to Dr. Roanna Raider on 10/04/2012 for evaluation of polycythemia. Today he is accompanied by his daughter.   He was last seen by me on 11/27/2012. On 09/14/2012 his hemoglobin was 20.5, hematocrit was 56.8. The patient is smoker. He smokes greater than 1/2 PPD for 54 years. He has good appetite. His weight is stable He reported history headaches but when he started to control blood pressure it is gone. The patient denied fever, chills, night sweats, change in appetite or weight.He denied headaches since his blood pressure under control, double vision, blurry vision, nasal congestion, nasal discharge, hearing problems, odynophagia or dysphagia. No chest pain, palpitations, dyspnea, cough, abdominal pain, nausea, vomiting, diarrhea, constipation, hematochezia. The patient denied dysuria, nocturia, polyuria, hematuria, myalgia, numbness, tingling, psychiatric problems. He denies pruritus or headaches following his showers.    MEDICAL HISTORY: Past Medical History  Diagnosis Date  . HTN (hypertension)     INTERIM HISTORY: has Polycythemia, secondary; Tobacco abuse; and DVT, lower extremity on his problem list.    ALLERGIES:  is allergic to tape.  MEDICATIONS: has a current medication list which includes the following prescription(s): amlodipine, folic acid, losartan, and warfarin.  SURGICAL HISTORY: No past surgical history on file.  REVIEW OF SYSTEMS:   Constitutional: Denies  fevers, chills or abnormal weight loss Eyes: Denies blurriness of vision Ears, nose, mouth, throat, and face: Denies mucositis or sore throat Respiratory: Denies cough, dyspnea or wheezes Cardiovascular: Denies palpitation, chest discomfort or lower extremity swelling Gastrointestinal:  Denies nausea, heartburn or change in bowel habits Skin: Denies abnormal skin rashes Lymphatics: Denies new lymphadenopathy or easy bruising Neurological:Denies numbness, tingling or new weaknesses Behavioral/Psych: Mood is stable, no new changes  All other systems were reviewed with the patient and are negative.  PHYSICAL EXAMINATION: ECOG PERFORMANCE STATUS: 0 - Asymptomatic  Blood pressure 148/84, pulse 90, temperature 97.6 F (36.4 C), temperature source Oral, resp. rate 18, height 5\' 6"  (1.676 m), weight 135 lb 9.6 oz (61.508 kg).  GENERAL:alert, no distress and comfortable elderly who appears his stated age; thin SKIN: skin color, texture, turgor are normal, no rashes or significant lesions EYES: normal, Conjunctiva are pink and non-injected, sclera clear OROPHARYNX:no exudate, no erythema and lips, buccal mucosa, and tongue normal  NECK: supple, thyroid normal size, non-tender, without nodularity LYMPH:  no palpable lymphadenopathy in the cervical, axillary or inguinal LUNGS: clear to auscultation and percussion with normal breathing effort HEART: regular rate & rhythm and no murmurs and no lower extremity edema ABDOMEN:abdomen soft, non-tender and normal bowel sounds Musculoskeletal:no cyanosis of digits and no clubbing  NEURO: alert & oriented x 3 with fluent speech, no focal motor/sensory deficits   LABORATORY DATA: CBC    Component Value Date/Time   WBC 8.4 01/28/2013 1252   RBC 5.18 01/28/2013 1252   HGB 17.7* 01/28/2013 1252   HCT 53.1* 01/28/2013 1252   PLT 273 01/28/2013 1252   MCV 102.5* 01/28/2013 1252   MCH 34.2* 01/28/2013 1252   MCHC  33.3 01/28/2013 1252   RDW 14.6  01/28/2013 1252   LYMPHSABS 2.3 01/28/2013 1252   MONOABS 0.8 01/28/2013 1252   EOSABS 0.2 01/28/2013 1252   BASOSABS 0.1 01/28/2013 1252   CMP     Component Value Date/Time   NA 138 01/28/2013 1252   K 4.2 01/28/2013 1252   CO2 24 01/28/2013 1252   GLUCOSE 165* 01/28/2013 1252   BUN 10.2 01/28/2013 1252   CREATININE 0.9 01/28/2013 1252   CALCIUM 9.4 01/28/2013 1252   PROT 7.5 01/28/2013 1252   ALBUMIN 4.0 01/28/2013 1252   AST 21 01/28/2013 1252   ALT 14 01/28/2013 1252   ALKPHOS 64 01/28/2013 1252   BILITOT 0.43 01/28/2013 1252    RADIOGRAPHIC STUDIES: RIGHT LOWER EXTREMITY VENOUS DUPLEX ULTRASOUND  (10/25/2011) Technique: Gray-scale sonography with graded compression, as well as color Doppler and duplex ultrasound, were performed to evaluate the deep venous system of the lower extremity from the level of the common femoral vein through the popliteal and proximal calf veins. Spectral Doppler was utilized to evaluate flow at rest and with distal augmentation maneuvers. Comparison: 06/02/2011 Findings: Residual echogenic intraluminal thrombus noted in the superficial femoral and popliteal veins. Partially occlusive thrombus persist at the saphenofemoral junction extending into the great saphenous vein. Slight interval decrease in thrombus burden throughout the right lower extremity deep veins and the superficial saphenous vein. No occlusive DVT demonstrated on today's study. IMPRESSION:  Residual partially occlusive right femoral popliteal DVT. Some interval improvement. Residual superficial thrombosis of the great saphenous vein, little interval change. Original Report Authenticated By: Judie Petit. Ruel Favors, M.D.   ASSESSMENT: 1) Tobacco abuse (longstanding); 2) Polycythemia; 3) Right lower extremity DVT  PLAN:  1. Polycythemia.  --We discussed with patient that his polycythemia can be secondary to smoking and again recommended to stop smoking. The patient agrees to continue reduce the  amount smoking. He declined tobacco cessation referral presently.  We will proceed with further work up for other causes of polycythemia as polycythemia vera. JAK testing is negative and his EPO level is normal. Her will require a phlebectomy today.   2. RLE DVT. --He will continue coumadin for treatment for his DVT and phlebotomies prn HCT greater than or equal to 45% q monthly.  We provided a detailed overview of polycythemia and deep venous thrombosis.  A handout was also provided.   3. Tobacco abuse.  --Likely contributing to #1.   He will continue to reduce the amount of smoking.   4. Follow-up. --He will return monthly for CBC and phlebotomies prn and a return office visit in 2 months.  We discussed screening exams including colonoscopy.  The patient has not had a colonoscopy and declines to have one.  He understood the risks including presenting with colon cancer at an advanced stage.   All questions were answered. The patient knows to call the clinic with any problems, questions or concerns. We can certainly see the patient much sooner if necessary.  I spent 15 minutes counseling the patient face to face. The total time spent in the appointment was 25 minutes.    Jasan Doughtie, MD 01/29/2013 1:14 PM

## 2013-01-29 NOTE — Progress Notes (Signed)
1040-Phlebotomy complete to left ac without difficulty.  250 grams removed.  Pressure dressing applied.  Pt refused snack, but did drink a glass of water.  1110-VS stable.  Pt has no complaints at this time.  Pressure dressing clean, dry and intact to left ac.

## 2013-01-29 NOTE — Patient Instructions (Signed)

## 2013-02-25 ENCOUNTER — Other Ambulatory Visit (HOSPITAL_BASED_OUTPATIENT_CLINIC_OR_DEPARTMENT_OTHER): Payer: Medicare Other

## 2013-02-25 ENCOUNTER — Ambulatory Visit (HOSPITAL_BASED_OUTPATIENT_CLINIC_OR_DEPARTMENT_OTHER): Payer: Medicare Other

## 2013-02-25 VITALS — BP 148/81 | HR 68 | Temp 97.7°F

## 2013-02-25 DIAGNOSIS — D751 Secondary polycythemia: Secondary | ICD-10-CM

## 2013-02-25 LAB — CBC WITH DIFFERENTIAL/PLATELET
Basophils Absolute: 0.1 10*3/uL (ref 0.0–0.1)
EOS%: 4.3 % (ref 0.0–7.0)
Eosinophils Absolute: 0.3 10*3/uL (ref 0.0–0.5)
HCT: 55.4 % — ABNORMAL HIGH (ref 38.4–49.9)
HGB: 18.5 g/dL — ABNORMAL HIGH (ref 13.0–17.1)
MCH: 33.9 pg — ABNORMAL HIGH (ref 27.2–33.4)
MCHC: 33.4 g/dL (ref 32.0–36.0)
MCV: 101.5 fL — ABNORMAL HIGH (ref 79.3–98.0)
NEUT%: 51.1 % (ref 39.0–75.0)
RDW: 14 % (ref 11.0–14.6)
lymph#: 2.2 10*3/uL (ref 0.9–3.3)

## 2013-02-25 NOTE — Patient Instructions (Signed)
Therapeutic Phlebotomy Therapeutic phlebotomy is the controlled removal of blood from your body for the purpose of treating a medical condition. It is similar to donating blood. Usually, about a pint (470 mL) of blood is removed. The average adult has 9 to 12 pints (4.3 to 5.7 L) of blood. Therapeutic phlebotomy may be used to treat the following medical conditions:  Hemochromatosis. This is a condition in which there is too much iron in the blood.  Polycythemia vera. This is a condition in which there are too many red cells in the blood.  Porphyria cutanea tarda. This is a disease usually passed from one generation to the next (inherited). It is a condition in which an important part of hemoglobin is not made properly. This results in the build up of abnormal amounts of porphyrins in the body.  Sickle cell disease. This is an inherited disease. It is a condition in which the red blood cells form an abnormal crescent shape rather than a round shape. LET YOUR CAREGIVER KNOW ABOUT:  Allergies.  Medicines taken including herbs, eyedrops, over-the-counter medicines, and creams.  Use of steroids (by mouth or creams).  Previous problems with anesthetics or numbing medicine.  History of blood clots.  History of bleeding or blood problems.  Previous surgery.  Possibility of pregnancy, if this applies. RISKS AND COMPLICATIONS This is a simple and safe procedure. Problems are unlikely. However, problems can occur and may include:  Nausea or lightheadedness.  Low blood pressure.  Soreness, bleeding, swelling, or bruising at the needle insertion site.  Infection. BEFORE THE PROCEDURE  This is a procedure that can be done as an outpatient. Confirm the time that you need to arrive for your procedure. Confirm whether there is a need to fast or withhold any medications. It is helpful to wear clothing with sleeves that can be raised above the elbow. A blood sample may be done to determine the  amount of red blood cells or iron in your blood. Plan ahead of time to have someone drive you home after the procedure. PROCEDURE The entire procedure from preparation through recovery takes about 1 hour. The actual collection takes about 10 to 15 minutes.  A needle will be inserted into your vein.  Tubing and a collection bag will be attached to that needle.  Blood will flow through the needle and tubing into the collection bag.  You may be asked to open and close your hand slowly and continuously during the entire collection.  Once the specified amount of blood has been removed from your body, the collection bag and tubing will be clamped.  The needle will be removed.  Pressure will be held on the site of the needle insertion to stop the bleeding. Then a bandage will be placed over the needle insertion site. AFTER THE PROCEDURE  Your recovery will be assessed and monitored. If there are no problems, as an outpatient, you should be able to go home shortly after the procedure.  Document Released: 07/26/2010 Document Revised: 05/16/2011 Document Reviewed: 07/26/2010 [employer] Patient Information 2014 [employer].  

## 2013-03-25 ENCOUNTER — Encounter (INDEPENDENT_AMBULATORY_CARE_PROVIDER_SITE_OTHER): Payer: Self-pay

## 2013-03-25 ENCOUNTER — Other Ambulatory Visit (HOSPITAL_BASED_OUTPATIENT_CLINIC_OR_DEPARTMENT_OTHER): Payer: Medicare Other

## 2013-03-25 ENCOUNTER — Ambulatory Visit (HOSPITAL_BASED_OUTPATIENT_CLINIC_OR_DEPARTMENT_OTHER): Payer: Medicare Other

## 2013-03-25 ENCOUNTER — Telehealth: Payer: Self-pay | Admitting: Internal Medicine

## 2013-03-25 ENCOUNTER — Ambulatory Visit (HOSPITAL_BASED_OUTPATIENT_CLINIC_OR_DEPARTMENT_OTHER): Payer: Medicare Other | Admitting: Internal Medicine

## 2013-03-25 VITALS — BP 130/77 | HR 84

## 2013-03-25 VITALS — BP 155/75 | HR 97 | Temp 97.4°F | Resp 18 | Ht 66.0 in | Wt 134.4 lb

## 2013-03-25 DIAGNOSIS — F17200 Nicotine dependence, unspecified, uncomplicated: Secondary | ICD-10-CM

## 2013-03-25 DIAGNOSIS — I82409 Acute embolism and thrombosis of unspecified deep veins of unspecified lower extremity: Secondary | ICD-10-CM

## 2013-03-25 DIAGNOSIS — Z720 Tobacco use: Secondary | ICD-10-CM

## 2013-03-25 DIAGNOSIS — D751 Secondary polycythemia: Secondary | ICD-10-CM

## 2013-03-25 LAB — CBC WITH DIFFERENTIAL/PLATELET
BASO%: 1.3 % (ref 0.0–2.0)
BASOS ABS: 0.1 10*3/uL (ref 0.0–0.1)
EOS%: 4.4 % (ref 0.0–7.0)
Eosinophils Absolute: 0.3 10*3/uL (ref 0.0–0.5)
HCT: 55.4 % — ABNORMAL HIGH (ref 38.4–49.9)
HGB: 18.2 g/dL — ABNORMAL HIGH (ref 13.0–17.1)
LYMPH#: 2.4 10*3/uL (ref 0.9–3.3)
LYMPH%: 35.6 % (ref 14.0–49.0)
MCH: 33.2 pg (ref 27.2–33.4)
MCHC: 32.8 g/dL (ref 32.0–36.0)
MCV: 101.2 fL — AB (ref 79.3–98.0)
MONO#: 0.7 10*3/uL (ref 0.1–0.9)
MONO%: 9.7 % (ref 0.0–14.0)
NEUT#: 3.3 10*3/uL (ref 1.5–6.5)
NEUT%: 49 % (ref 39.0–75.0)
Platelets: 242 10*3/uL (ref 140–400)
RBC: 5.48 10*6/uL (ref 4.20–5.82)
RDW: 13.8 % (ref 11.0–14.6)
WBC: 6.8 10*3/uL (ref 4.0–10.3)

## 2013-03-25 LAB — COMPREHENSIVE METABOLIC PANEL (CC13)
ALT: 17 U/L (ref 0–55)
AST: 20 U/L (ref 5–34)
Albumin: 3.9 g/dL (ref 3.5–5.0)
Alkaline Phosphatase: 65 U/L (ref 40–150)
Anion Gap: 12 mEq/L — ABNORMAL HIGH (ref 3–11)
BUN: 8.6 mg/dL (ref 7.0–26.0)
CHLORIDE: 107 meq/L (ref 98–109)
CO2: 22 mEq/L (ref 22–29)
CREATININE: 1 mg/dL (ref 0.7–1.3)
Calcium: 9.4 mg/dL (ref 8.4–10.4)
Glucose: 263 mg/dl — ABNORMAL HIGH (ref 70–140)
Potassium: 3.9 mEq/L (ref 3.5–5.1)
Sodium: 141 mEq/L (ref 136–145)
Total Bilirubin: 0.36 mg/dL (ref 0.20–1.20)
Total Protein: 7.2 g/dL (ref 6.4–8.3)

## 2013-03-25 MED ORDER — HYDROXYUREA 500 MG PO CAPS: 500.0000 mg | ORAL_CAPSULE | Freq: Every day | ORAL | Status: DC

## 2013-03-25 NOTE — Patient Instructions (Signed)
Therapeutic Phlebotomy Therapeutic phlebotomy is the controlled removal of blood from your body for the purpose of treating a medical condition. It is similar to donating blood. Usually, about a pint (470 mL) of blood is removed. The average adult has 9 to 12 pints (4.3 to 5.7 L) of blood. Therapeutic phlebotomy may be used to treat the following medical conditions:  Hemochromatosis. This is a condition in which there is too much iron in the blood.  Polycythemia vera. This is a condition in which there are too many red cells in the blood.  Porphyria cutanea tarda. This is a disease usually passed from one generation to the next (inherited). It is a condition in which an important part of hemoglobin is not made properly. This results in the build up of abnormal amounts of porphyrins in the body.  Sickle cell disease. This is an inherited disease. It is a condition in which the red blood cells form an abnormal crescent shape rather than a round shape. LET YOUR CAREGIVER KNOW ABOUT:  Allergies.  Medicines taken including herbs, eyedrops, over-the-counter medicines, and creams.  Use of steroids (by mouth or creams).  Previous problems with anesthetics or numbing medicine.  History of blood clots.  History of bleeding or blood problems.  Previous surgery.  Possibility of pregnancy, if this applies. RISKS AND COMPLICATIONS This is a simple and safe procedure. Problems are unlikely. However, problems can occur and may include:  Nausea or lightheadedness.  Low blood pressure.  Soreness, bleeding, swelling, or bruising at the needle insertion site.  Infection. BEFORE THE PROCEDURE  This is a procedure that can be done as an outpatient. Confirm the time that you need to arrive for your procedure. Confirm whether there is a need to fast or withhold any medications. It is helpful to wear clothing with sleeves that can be raised above the elbow. A blood sample may be done to determine the  amount of red blood cells or iron in your blood. Plan ahead of time to have someone drive you home after the procedure. PROCEDURE The entire procedure from preparation through recovery takes about 1 hour. The actual collection takes about 10 to 15 minutes.  A needle will be inserted into your vein.  Tubing and a collection bag will be attached to that needle.  Blood will flow through the needle and tubing into the collection bag.  You may be asked to open and close your hand slowly and continuously during the entire collection.  Once the specified amount of blood has been removed from your body, the collection bag and tubing will be clamped.  The needle will be removed.  Pressure will be held on the site of the needle insertion to stop the bleeding. Then a bandage will be placed over the needle insertion site. AFTER THE PROCEDURE  Your recovery will be assessed and monitored. If there are no problems, as an outpatient, you should be able to go home shortly after the procedure.  Document Released: 07/26/2010 Document Revised: 05/16/2011 Document Reviewed: 07/26/2010 [employer] Patient Information 2014 [employer].  

## 2013-03-25 NOTE — Telephone Encounter (Signed)
gv pt appt schedule for feb and march.

## 2013-03-25 NOTE — Patient Instructions (Signed)
Hydroxyurea capsules What is this medicine? HYDROXYUREA (hye drox ee yoor EE a) is a chemotherapy drug. It slows the growth of cancer cells. This medicine is used to treat certain leukemias, skin cancer, head and neck cancer, and advanced ovarian cancer. It is also used to control the painful crises of sickle cell anemia. This medicine may be used for other purposes; ask your health care provider or pharmacist if you have questions. COMMON BRAND NAME(S): Droxia, Hydrea What should I tell my health care provider before I take this medicine? They need to know if you have any of these conditions: -immune system problems -infection (especially a virus infection such as chickenpox, cold sores, or herpes) -kidney disease -low blood counts, like low white cell, platelet, or red cell counts -previous or ongoing radiation therapy -an unusual or allergic reaction to hydroxyurea, other chemotherapy, other medicines, foods, dyes, or preservatives -pregnant or trying to get pregnant -breast-feeding How should I use this medicine? Take this medicine by mouth with a glass of water. Follow the directions on the prescription label. Take your medicine at regular intervals. Do not take it more often than directed. Do not stop taking except on your doctor's advice. People who are not taking this medicine should not be exposed to it. Wash your hands before and after handling your bottle or medicine. Caregivers should wear disposable gloves if they must touch the bottle or medicine. Clean up any medicine powder that spills with a damp disposable towel and throw the towel away in a closed container, such as a plastic bag. Talk to your pediatrician regarding the use of this medicine in children. Special care may be needed. Patients over 65 years old may have a stronger reaction and need a smaller dose. Overdosage: If you think you have taken too much of this medicine contact a poison control center or emergency room at  once. NOTE: This medicine is only for you. Do not share this medicine with others. What if I miss a dose? If you miss a dose, take it as soon as you can. If it is almost time for your next dose, take only that dose. Do not take double or extra doses. What may interact with this medicine? -didanosine -other chemotherapy agents -stavudine -tenofovir -vaccines This list may not describe all possible interactions. Give your health care provider a list of all the medicines, herbs, non-prescription drugs, or dietary supplements you use. Also tell them if you smoke, drink alcohol, or use illegal drugs. Some items may interact with your medicine. What should I watch for while using this medicine? This drug may make you feel generally unwell. This is not uncommon, as chemotherapy can affect healthy cells as well as cancer cells. Report any side effects. Continue your course of treatment even though you feel ill unless your doctor tells you to stop. You will receive regular blood tests during your treatment. Call your doctor or health care professional for advice if you get a fever, chills or sore throat, or other symptoms of a cold or flu. Do not treat yourself. This drug decreases your body's ability to fight infections. Try to avoid being around people who are sick. This medicine may increase your risk to bruise or bleed. Call your doctor or health care professional if you notice any unusual bleeding. Be careful brushing and flossing your teeth or using a toothpick because you may get an infection or bleed more easily. If you have any dental work done, tell your dentist you are   receiving this medicine. Avoid taking products that contain aspirin, acetaminophen, ibuprofen, naproxen, or ketoprofen unless instructed by your doctor. These medicines may hide a fever. Do not become pregnant while taking this medicine. Women should inform their doctor if they wish to become pregnant or think they might be  pregnant. There is a potential for serious side effects to an unborn child. Men should inform their doctors if they wish to father a child. This medicine may lower sperm counts. Talk to your health care professional or pharmacist for more information. Do not breast-feed an infant while taking this medicine. What side effects may I notice from receiving this medicine? Side effects that you should report to your doctor or health care professional as soon as possible: -allergic reactions like skin rash, itching or hives, swelling of the face, lips, or tongue -low blood counts - this medicine may decrease the number of white blood cells, red blood cells and platelets. You may be at increased risk for infections and bleeding. -signs of infection - fever or chills, cough, sore throat, pain or difficulty passing urine -signs of decreased platelets or bleeding - bruising, pinpoint red spots on the skin, black, tarry stools, blood in the urine -signs of decreased red blood cells - unusually weak or tired, fainting spells, lightheadedness -breathing problems -burning, redness or pain at the site of any radiation therapy -changes in skin color -confusion -mouth sores -pain, tingling, numbness in the hands or feet -seizures -skin ulcers -trouble passing urine or change in the amount of urine -vomiting Side effects that usually do not require medical attention (report to your doctor or health care professional if they continue or are bothersome): -headache -loss of appetite -red color to the face This list may not describe all possible side effects. Call your doctor for medical advice about side effects. You may report side effects to FDA at 1-800-FDA-1088. Where should I keep my medicine? Keep out of the reach of children. Store at room temperature between 15 and 30 degrees C (59 and 86 degrees F). Keep tightly closed. Throw away any unused medicine after the expiration date. NOTE: This sheet is a  summary. It may not cover all possible information. If you have questions about this medicine, talk to your doctor, pharmacist, or health care provider.  2014, [employer]/Gold Standard. (2007-07-06 15:03:29)  

## 2013-03-25 NOTE — Progress Notes (Signed)
0945-10:00 Pt phlebotomized via 18 g angiocath to left AC.  500 g drawn per Dr. Juliann Mule order. Pt tolerated well and is without complaints. Pressure dressing to rt AC dry and intact.  10:30  VS/stable. Pt discharged alert and ambulatory without complaints.

## 2013-03-25 NOTE — Progress Notes (Signed)
[facility] OFFICE PROGRESS NOTE  [name], MD [place] [address]  DIAGNOSIS: Polycythemia, secondary - Plan: Comprehensive metabolic panel, CBC with Differential, Comprehensive metabolic panel, CBC with Differential, CBC with Differential  DVT, lower extremity  Tobacco abuse  Chief Complaint  Patient presents with  . Polycythemia, secondary    CURRENT THERAPY:  Phlebotomy to maintain a hemoglobin less than 45%. Hydrea 500 mg daily started on 03/25/2013.   INTERVAL HISTORY: Isaiah York 68 y.o. male  who was initially sent to Dr. Erskine Speed on 10/04/2012 for evaluation of polycythemia. Today he is accompanied by his daughter.   He was last seen by me on 01/28/2013. On 09/14/2012 his hemoglobin was 20.5, hematocrit was 56.8. The patient is smoker. He smokes greater than 1/2 PPD for 54 years. He has good appetite. His weight is stable He reported history headaches but when he started to control blood pressure it is gone. The patient denied fever, chills, night sweats, change in appetite or weight.He denied headaches since his blood pressure under control, double vision, blurry vision, nasal congestion, nasal discharge, hearing problems, odynophagia or dysphagia. No chest pain, palpitations, dyspnea, cough, abdominal pain, nausea, vomiting, diarrhea, constipation, hematochezia. The patient denied dysuria, nocturia, polyuria, hematuria, myalgia, numbness, tingling, psychiatric problems. He denies pruritus or headaches following his showers.    Today, he reports not wanting phlebotomy so much.  It makes him feel frustrated.   He recently learned that his wife is ill and it makes him feel stressful.   MEDICAL HISTORY: Past Medical History  Diagnosis Date  . HTN (hypertension)     INTERIM HISTORY: has Polycythemia, secondary; Tobacco abuse; and DVT, lower extremity on his problem list.    ALLERGIES:  is allergic to tape.  MEDICATIONS: has a  current medication list which includes the following prescription(s): amlodipine, folic acid, losartan, warfarin, and hydroxyurea.  SURGICAL HISTORY: No past surgical history on file.  REVIEW OF SYSTEMS:   Constitutional: Denies fevers, chills or abnormal weight loss Eyes: Denies blurriness of vision Ears, nose, mouth, throat, and face: Denies mucositis or sore throat Respiratory: Denies cough, dyspnea or wheezes Cardiovascular: Denies palpitation, chest discomfort or lower extremity swelling Gastrointestinal:  Denies nausea, heartburn or change in bowel habits Skin: Denies abnormal skin rashes Lymphatics: Denies new lymphadenopathy or easy bruising Neurological:Denies numbness, tingling or new weaknesses Behavioral/Psych: Mood is stable, no new changes  All other systems were reviewed with the patient and are negative.  PHYSICAL EXAMINATION: ECOG PERFORMANCE STATUS: 0 - Asymptomatic  Blood pressure 155/75, pulse 97, temperature 97.4 F (36.3 C), temperature source Oral, resp. rate 18, height 5\' 6"  (1.676 m), weight 134 lb 6.4 oz (60.963 kg), SpO2 98.00%.  GENERAL:alert, no distress and comfortable elderly who appears his stated age; thin SKIN: skin color, texture, turgor are normal, no rashes or significant lesions EYES: normal, Conjunctiva are pink and non-injected, sclera clear OROPHARYNX:no exudate, no erythema and lips, buccal mucosa, and tongue normal  NECK: supple, thyroid normal size, non-tender, without nodularity LYMPH:  no palpable lymphadenopathy in the cervical, axillary or inguinal LUNGS: clear to auscultation and percussion with normal breathing effort HEART: regular rate & rhythm and no murmurs and no lower extremity edema ABDOMEN:abdomen soft, non-tender and normal bowel sounds Musculoskeletal:no cyanosis of digits and no clubbing  NEURO: alert & oriented x 3 with fluent speech, no focal motor/sensory deficits   LABORATORY DATA: CBC    Component Value Date/Time    WBC 6.8 03/25/2013 0749  RBC 5.48 03/25/2013 0749   HGB 18.2* 03/25/2013 0749   HCT 55.4* 03/25/2013 0749   PLT 242 03/25/2013 0749   MCV 101.2* 03/25/2013 0749   MCH 33.2 03/25/2013 0749   MCHC 32.8 03/25/2013 0749   RDW 13.8 03/25/2013 0749   LYMPHSABS 2.4 03/25/2013 0749   MONOABS 0.7 03/25/2013 0749   EOSABS 0.3 03/25/2013 0749   BASOSABS 0.1 03/25/2013 0749   CMP     Component Value Date/Time   NA 141 03/25/2013 0749   K 3.9 03/25/2013 0749   CO2 22 03/25/2013 0749   GLUCOSE 263* 03/25/2013 0749   BUN 8.6 03/25/2013 0749   CREATININE 1.0 03/25/2013 0749   CALCIUM 9.4 03/25/2013 0749   PROT 7.2 03/25/2013 0749   ALBUMIN 3.9 03/25/2013 0749   AST 20 03/25/2013 0749   ALT 17 03/25/2013 0749   ALKPHOS 65 03/25/2013 0749   BILITOT 0.36 03/25/2013 0749    RADIOGRAPHIC STUDIES: RIGHT LOWER EXTREMITY VENOUS DUPLEX ULTRASOUND  (10/25/2011) Technique: Gray-scale sonography with graded compression, as well as color Doppler and duplex ultrasound, were performed to evaluate the deep venous system of the lower extremity from the level of the common femoral vein through the popliteal and proximal calf veins. Spectral Doppler was utilized to evaluate flow at rest and with distal augmentation maneuvers. Comparison: 06/02/2011 Findings: Residual echogenic intraluminal thrombus noted in the superficial femoral and popliteal veins. Partially occlusive thrombus persist at the saphenofemoral junction extending into the great saphenous vein. Slight interval decrease in thrombus burden throughout the right lower extremity deep veins and the superficial saphenous vein. No occlusive DVT demonstrated on today's study. IMPRESSION:  Residual partially occlusive right femoral popliteal DVT. Some interval improvement. Residual superficial thrombosis of the great saphenous vein, little interval change. Original Report Authenticated By: Jerilynn Mages. Daryll Brod, M.D.   ASSESSMENT: 1) Tobacco abuse (longstanding); 2) Polycythemia; 3)  Right lower extremity DVT  PLAN:  1. Polycythemia.  --We discussed with patient that his polycythemia can be secondary to smoking and again recommended to stop smoking. The patient agrees to continue reduce the amount smoking. He declined tobacco cessation referral presently.  We will proceed with further work up for other causes of polycythemia as polycythemia vera. JAK testing is negative and his EPO level is normal. He will require a phlebectomy today.  --Given his reservation about continued phebotomy to reduce his risk of further clots, we will start hydrea 500 mg daily with a repeat CBC in 2 weeks.  He has normal creatinine.  We discussed the benefit of starting therapy would be to maintain his hct less than 45% and reduce his risk of further clots including CVA, MI and DVT.  His risk factors include his age.  Common side effects include myelosuppression, anorexia, n/v/d, constipation rash, drowsiness, ALT, AST elevated and renal impairment.  He understood the indications, benefits and risks and agreed to proceed.   2. RLE DVT. --He will continue coumadin for treatment for his DVT and phlebotomies prn HCT greater than or equal to 45% q monthly.  We provided a detailed overview of polycythemia and deep venous thrombosis.  A handout was also provided.   3. Tobacco abuse.  --Likely contributing to #1.   He will continue to reduce the amount of smoking.   4. Follow-up. --He will return monthly for CBC and phlebotomies prn and a return office visit in 2 months.  He will have an additional lab visit in 2 weeks to check his labs on hydrea.  We  discussed screening exams including colonoscopy.  The patient has not had a colonoscopy and declines to have one.  He understood the risks including presenting with colon cancer at an advanced stage.   All questions were answered. The patient knows to call the clinic with any problems, questions or concerns. We can certainly see the patient much sooner if  necessary.  I spent 15 minutes counseling the patient face to face. The total time spent in the appointment was 25 minutes.    Dalylah Ramey, MD 03/25/2013 8:58 AM

## 2013-04-08 ENCOUNTER — Other Ambulatory Visit (HOSPITAL_BASED_OUTPATIENT_CLINIC_OR_DEPARTMENT_OTHER): Payer: Medicare Other

## 2013-04-08 ENCOUNTER — Ambulatory Visit: Payer: Medicare Other

## 2013-04-08 DIAGNOSIS — D751 Secondary polycythemia: Secondary | ICD-10-CM

## 2013-04-08 LAB — COMPREHENSIVE METABOLIC PANEL (CC13)
ALK PHOS: 59 U/L (ref 40–150)
ALT: 15 U/L (ref 0–55)
AST: 19 U/L (ref 5–34)
Albumin: 3.8 g/dL (ref 3.5–5.0)
Anion Gap: 10 mEq/L (ref 3–11)
BILIRUBIN TOTAL: 0.28 mg/dL (ref 0.20–1.20)
BUN: 9.4 mg/dL (ref 7.0–26.0)
CO2: 22 meq/L (ref 22–29)
Calcium: 9.4 mg/dL (ref 8.4–10.4)
Chloride: 109 mEq/L (ref 98–109)
Creatinine: 0.9 mg/dL (ref 0.7–1.3)
Glucose: 211 mg/dl — ABNORMAL HIGH (ref 70–140)
Potassium: 4 mEq/L (ref 3.5–5.1)
SODIUM: 141 meq/L (ref 136–145)
TOTAL PROTEIN: 7.3 g/dL (ref 6.4–8.3)

## 2013-04-08 LAB — CBC WITH DIFFERENTIAL/PLATELET
BASO%: 0.5 % (ref 0.0–2.0)
Basophils Absolute: 0.1 10*3/uL (ref 0.0–0.1)
EOS ABS: 0.3 10*3/uL (ref 0.0–0.5)
EOS%: 3 % (ref 0.0–7.0)
HCT: 49.4 % (ref 38.4–49.9)
HGB: 16.4 g/dL (ref 13.0–17.1)
LYMPH%: 19.8 % (ref 14.0–49.0)
MCH: 33.5 pg — ABNORMAL HIGH (ref 27.2–33.4)
MCHC: 33.2 g/dL (ref 32.0–36.0)
MCV: 101 fL — AB (ref 79.3–98.0)
MONO#: 0.8 10*3/uL (ref 0.1–0.9)
MONO%: 8.2 % (ref 0.0–14.0)
NEUT#: 7 10*3/uL — ABNORMAL HIGH (ref 1.5–6.5)
NEUT%: 68.5 % (ref 39.0–75.0)
Platelets: 235 10*3/uL (ref 140–400)
RBC: 4.89 10*6/uL (ref 4.20–5.82)
RDW: 14.3 % (ref 11.0–14.6)
WBC: 10.3 10*3/uL (ref 4.0–10.3)
lymph#: 2 10*3/uL (ref 0.9–3.3)

## 2013-04-08 NOTE — Progress Notes (Signed)
No phlebotomy today per Dr Juliann Mule

## 2013-04-22 ENCOUNTER — Other Ambulatory Visit (HOSPITAL_BASED_OUTPATIENT_CLINIC_OR_DEPARTMENT_OTHER): Payer: Medicare Other

## 2013-04-22 ENCOUNTER — Ambulatory Visit (HOSPITAL_BASED_OUTPATIENT_CLINIC_OR_DEPARTMENT_OTHER): Payer: Medicare Other

## 2013-04-22 VITALS — BP 151/92 | HR 86 | Temp 98.0°F | Resp 20

## 2013-04-22 DIAGNOSIS — D751 Secondary polycythemia: Secondary | ICD-10-CM

## 2013-04-22 LAB — CBC WITH DIFFERENTIAL/PLATELET
BASO%: 1.4 % (ref 0.0–2.0)
Basophils Absolute: 0.1 10*3/uL (ref 0.0–0.1)
EOS ABS: 0.2 10*3/uL (ref 0.0–0.5)
EOS%: 2.9 % (ref 0.0–7.0)
HCT: 51.3 % — ABNORMAL HIGH (ref 38.4–49.9)
HGB: 16.9 g/dL (ref 13.0–17.1)
LYMPH#: 2.1 10*3/uL (ref 0.9–3.3)
LYMPH%: 32.2 % (ref 14.0–49.0)
MCH: 33.3 pg (ref 27.2–33.4)
MCHC: 33 g/dL (ref 32.0–36.0)
MCV: 101 fL — ABNORMAL HIGH (ref 79.3–98.0)
MONO#: 0.6 10*3/uL (ref 0.1–0.9)
MONO%: 8.9 % (ref 0.0–14.0)
NEUT#: 3.6 10*3/uL (ref 1.5–6.5)
NEUT%: 54.6 % (ref 39.0–75.0)
PLATELETS: 265 10*3/uL (ref 140–400)
RBC: 5.08 10*6/uL (ref 4.20–5.82)
RDW: 15.2 % — AB (ref 11.0–14.6)
WBC: 6.5 10*3/uL (ref 4.0–10.3)

## 2013-04-22 NOTE — Patient Instructions (Signed)

## 2013-04-22 NOTE — Progress Notes (Signed)
Approximately 278 ml aspirated. Clotted off. Per dr Juliann Mule, he spoke  to patient re: increasing his hydrea dose. Keep next appt for 1st week in march. Patient tolerated well.

## 2013-04-23 ENCOUNTER — Encounter: Payer: Self-pay | Admitting: Internal Medicine

## 2013-04-23 ENCOUNTER — Telehealth: Payer: Self-pay | Admitting: Internal Medicine

## 2013-04-23 NOTE — Telephone Encounter (Signed)
In error

## 2013-04-23 NOTE — Patient Instructions (Signed)
Patient requiring phlebetomy.  We will increase his hydrea from 500 mg daily to 500 mg on Monday/W/F/Sun and 1000 mg on Tues/Thur/Sat.  He voiced understanding.

## 2013-05-20 ENCOUNTER — Telehealth: Payer: Self-pay | Admitting: Internal Medicine

## 2013-05-20 ENCOUNTER — Ambulatory Visit (HOSPITAL_BASED_OUTPATIENT_CLINIC_OR_DEPARTMENT_OTHER): Payer: Medicare Other | Admitting: Internal Medicine

## 2013-05-20 ENCOUNTER — Other Ambulatory Visit (HOSPITAL_BASED_OUTPATIENT_CLINIC_OR_DEPARTMENT_OTHER): Payer: Medicare Other

## 2013-05-20 VITALS — BP 150/70 | HR 94 | Temp 97.7°F | Resp 19 | Ht 66.0 in | Wt 133.2 lb

## 2013-05-20 DIAGNOSIS — D751 Secondary polycythemia: Secondary | ICD-10-CM

## 2013-05-20 DIAGNOSIS — I82409 Acute embolism and thrombosis of unspecified deep veins of unspecified lower extremity: Secondary | ICD-10-CM

## 2013-05-20 DIAGNOSIS — Z720 Tobacco use: Secondary | ICD-10-CM

## 2013-05-20 DIAGNOSIS — F17200 Nicotine dependence, unspecified, uncomplicated: Secondary | ICD-10-CM

## 2013-05-20 LAB — COMPREHENSIVE METABOLIC PANEL (CC13)
ALBUMIN: 4.1 g/dL (ref 3.5–5.0)
ALT: 15 U/L (ref 0–55)
ANION GAP: 11 meq/L (ref 3–11)
AST: 21 U/L (ref 5–34)
Alkaline Phosphatase: 55 U/L (ref 40–150)
BUN: 9.8 mg/dL (ref 7.0–26.0)
CHLORIDE: 107 meq/L (ref 98–109)
CO2: 24 meq/L (ref 22–29)
Calcium: 9.9 mg/dL (ref 8.4–10.4)
Creatinine: 0.9 mg/dL (ref 0.7–1.3)
GLUCOSE: 204 mg/dL — AB (ref 70–140)
POTASSIUM: 4 meq/L (ref 3.5–5.1)
SODIUM: 142 meq/L (ref 136–145)
TOTAL PROTEIN: 7.6 g/dL (ref 6.4–8.3)
Total Bilirubin: 0.46 mg/dL (ref 0.20–1.20)

## 2013-05-20 LAB — CBC WITH DIFFERENTIAL/PLATELET
BASO%: 1 % (ref 0.0–2.0)
Basophils Absolute: 0.1 10*3/uL (ref 0.0–0.1)
EOS%: 4.3 % (ref 0.0–7.0)
Eosinophils Absolute: 0.3 10*3/uL (ref 0.0–0.5)
HCT: 50.1 % — ABNORMAL HIGH (ref 38.4–49.9)
HGB: 16.6 g/dL (ref 13.0–17.1)
LYMPH%: 36.6 % (ref 14.0–49.0)
MCH: 34.4 pg — AB (ref 27.2–33.4)
MCHC: 33.1 g/dL (ref 32.0–36.0)
MCV: 104 fL — ABNORMAL HIGH (ref 79.3–98.0)
MONO#: 0.5 10*3/uL (ref 0.1–0.9)
MONO%: 8.7 % (ref 0.0–14.0)
NEUT#: 3 10*3/uL (ref 1.5–6.5)
NEUT%: 49.4 % (ref 39.0–75.0)
Platelets: 158 10*3/uL (ref 140–400)
RBC: 4.82 10*6/uL (ref 4.20–5.82)
RDW: 20.6 % — ABNORMAL HIGH (ref 11.0–14.6)
WBC: 6.2 10*3/uL (ref 4.0–10.3)
lymph#: 2.3 10*3/uL (ref 0.9–3.3)

## 2013-05-20 MED ORDER — HYDROXYUREA 500 MG PO CAPS: 500.0000 mg | ORAL_CAPSULE | Freq: Two times a day (BID) | ORAL | Status: DC

## 2013-05-20 NOTE — Telephone Encounter (Signed)
, °

## 2013-05-20 NOTE — Patient Instructions (Signed)
Hydroxyurea capsules What is this medicine? HYDROXYUREA (hye drox ee yoor EE a) is a chemotherapy drug. It slows the growth of cancer cells. This medicine is used to treat certain leukemias, skin cancer, head and neck cancer, and advanced ovarian cancer. It is also used to control the painful crises of sickle cell anemia. This medicine may be used for other purposes; ask your health care provider or pharmacist if you have questions. COMMON BRAND NAME(S): Droxia, Hydrea What should I tell my health care provider before I take this medicine? They need to know if you have any of these conditions: -immune system problems -infection (especially a virus infection such as chickenpox, cold sores, or herpes) -kidney disease -low blood counts, like low white cell, platelet, or red cell counts -previous or ongoing radiation therapy -an unusual or allergic reaction to hydroxyurea, other chemotherapy, other medicines, foods, dyes, or preservatives -pregnant or trying to get pregnant -breast-feeding How should I use this medicine? Take this medicine by mouth with a glass of water. Follow the directions on the prescription label. Take your medicine at regular intervals. Do not take it more often than directed. Do not stop taking except on your doctor's advice. People who are not taking this medicine should not be exposed to it. Wash your hands before and after handling your bottle or medicine. Caregivers should wear disposable gloves if they must touch the bottle or medicine. Clean up any medicine powder that spills with a damp disposable towel and throw the towel away in a closed container, such as a plastic bag. Talk to your pediatrician regarding the use of this medicine in children. Special care may be needed. Patients over 65 years old may have a stronger reaction and need a smaller dose. Overdosage: If you think you have taken too much of this medicine contact a poison control center or emergency room at  once. NOTE: This medicine is only for you. Do not share this medicine with others. What if I miss a dose? If you miss a dose, take it as soon as you can. If it is almost time for your next dose, take only that dose. Do not take double or extra doses. What may interact with this medicine? -didanosine -other chemotherapy agents -stavudine -tenofovir -vaccines This list may not describe all possible interactions. Give your health care provider a list of all the medicines, herbs, non-prescription drugs, or dietary supplements you use. Also tell them if you smoke, drink alcohol, or use illegal drugs. Some items may interact with your medicine. What should I watch for while using this medicine? This drug may make you feel generally unwell. This is not uncommon, as chemotherapy can affect healthy cells as well as cancer cells. Report any side effects. Continue your course of treatment even though you feel ill unless your doctor tells you to stop. You will receive regular blood tests during your treatment. Call your doctor or health care professional for advice if you get a fever, chills or sore throat, or other symptoms of a cold or flu. Do not treat yourself. This drug decreases your body's ability to fight infections. Try to avoid being around people who are sick. This medicine may increase your risk to bruise or bleed. Call your doctor or health care professional if you notice any unusual bleeding. Be careful brushing and flossing your teeth or using a toothpick because you may get an infection or bleed more easily. If you have any dental work done, tell your dentist you are   receiving this medicine. Avoid taking products that contain aspirin, acetaminophen, ibuprofen, naproxen, or ketoprofen unless instructed by your doctor. These medicines may hide a fever. Do not become pregnant while taking this medicine. Women should inform their doctor if they wish to become pregnant or think they might be  pregnant. There is a potential for serious side effects to an unborn child. Men should inform their doctors if they wish to father a child. This medicine may lower sperm counts. Talk to your health care professional or pharmacist for more information. Do not breast-feed an infant while taking this medicine. What side effects may I notice from receiving this medicine? Side effects that you should report to your doctor or health care professional as soon as possible: -allergic reactions like skin rash, itching or hives, swelling of the face, lips, or tongue -low blood counts - this medicine may decrease the number of white blood cells, red blood cells and platelets. You may be at increased risk for infections and bleeding. -signs of infection - fever or chills, cough, sore throat, pain or difficulty passing urine -signs of decreased platelets or bleeding - bruising, pinpoint red spots on the skin, black, tarry stools, blood in the urine -signs of decreased red blood cells - unusually weak or tired, fainting spells, lightheadedness -breathing problems -burning, redness or pain at the site of any radiation therapy -changes in skin color -confusion -mouth sores -pain, tingling, numbness in the hands or feet -seizures -skin ulcers -trouble passing urine or change in the amount of urine -vomiting Side effects that usually do not require medical attention (report to your doctor or health care professional if they continue or are bothersome): -headache -loss of appetite -red color to the face This list may not describe all possible side effects. Call your doctor for medical advice about side effects. You may report side effects to FDA at 1-800-FDA-1088. Where should I keep my medicine? Keep out of the reach of children. Store at room temperature between 15 and 30 degrees C (59 and 86 degrees F). Keep tightly closed. Throw away any unused medicine after the expiration date. NOTE: This sheet is a  summary. It may not cover all possible information. If you have questions about this medicine, talk to your doctor, pharmacist, or health care provider.  2014, [employer]/Gold Standard. (2007-07-06 15:03:29)  

## 2013-05-21 NOTE — Progress Notes (Signed)
[facility] OFFICE PROGRESS NOTE  [name], MD [place] [address]  DIAGNOSIS: Polycythemia, secondary - Plan: CBC with Differential in 1 month, CBC with Differential in 2 months, Comprehensive metabolic panel  DVT, lower extremity  Tobacco abuse  Chief Complaint  Patient presents with  . Polycythemia, secondary    CURRENT THERAPY:  Phlebotomy to maintain a hemoglobin less than 45%. Hydrea 500 mg daily started on 03/25/2013 but increased to 500 mg daily alternating with 1,000 mg daily about 6 weeks ago. .   INTERVAL HISTORY: Isaiah York 68 y.o. male  who was initially sent to Dr. Erskine Speed on 10/04/2012 for evaluation of polycythemia. Today he is accompanied by his daughter.   He was last seen by me on 03/25/2013. On 09/14/2012 his hemoglobin was 20.5, hematocrit was 56.8. The patient is smoker. He smokes greater than 1/2 PPD for 54 years. He has good appetite. His weight is stable.  He reported history headaches but when he started to control blood pressure it is gone. The patient denied fever, chills, night sweats, change in appetite or weight.He denied headaches since his blood pressure under control, double vision, blurry vision, nasal congestion, nasal discharge, hearing problems, odynophagia or dysphagia. No chest pain, palpitations, dyspnea, cough, abdominal pain, nausea, vomiting, diarrhea, constipation, hematochezia. The patient denied dysuria, nocturia, polyuria, hematuria, myalgia, numbness, tingling, psychiatric problems. He denies pruritus or headaches following his showers.    Today, he again reports not wanting phlebotomy so much.  It makes him feel frustrated.      MEDICAL HISTORY: Past Medical History  Diagnosis Date  . HTN (hypertension)     INTERIM HISTORY: has Polycythemia, secondary; Tobacco abuse; and DVT, lower extremity on his problem list.    ALLERGIES:  is allergic to tape.  MEDICATIONS: has a current  medication list which includes the following prescription(s): amlodipine, folic acid, losartan, warfarin, and hydroxyurea.  SURGICAL HISTORY: No past surgical history on file.  REVIEW OF SYSTEMS:   Constitutional: Denies fevers, chills or abnormal weight loss Eyes: Denies blurriness of vision Ears, nose, mouth, throat, and face: Denies mucositis or sore throat Respiratory: Denies cough, dyspnea or wheezes Cardiovascular: Denies palpitation, chest discomfort or lower extremity swelling Gastrointestinal:  Denies nausea, heartburn or change in bowel habits Skin: Denies abnormal skin rashes Lymphatics: Denies new lymphadenopathy or easy bruising Neurological:Denies numbness, tingling or new weaknesses Behavioral/Psych: Mood is stable, no new changes  All other systems were reviewed with the patient and are negative.  PHYSICAL EXAMINATION: ECOG PERFORMANCE STATUS: 0 - Asymptomatic  Blood pressure 150/70, pulse 94, temperature 97.7 F (36.5 C), temperature source Oral, resp. rate 19, height 5\' 6"  (1.676 m), weight 133 lb 3.2 oz (60.419 kg), SpO2 100.00%.  GENERAL:alert, no distress and comfortable elderly who appears his stated age; thin SKIN: skin color, texture, turgor are normal, no rashes or significant lesions EYES: normal, Conjunctiva are pink and non-injected, sclera clear OROPHARYNX:no exudate, no erythema and lips, buccal mucosa, and tongue normal  NECK: supple, thyroid normal size, non-tender, without nodularity LYMPH:  no palpable lymphadenopathy in the cervical, axillary or inguinal LUNGS: clear to auscultation and percussion with normal breathing effort HEART: regular rate & rhythm and no murmurs and no lower extremity edema ABDOMEN:abdomen soft, non-tender and normal bowel sounds Musculoskeletal:no cyanosis of digits and no clubbing  NEURO: alert & oriented x 3 with fluent speech, no focal motor/sensory deficits   LABORATORY DATA: CBC    Component Value Date/Time  WBC  6.2 05/20/2013 0958   RBC 4.82 05/20/2013 0958   HGB 16.6 05/20/2013 0958   HCT 50.1* 05/20/2013 0958   PLT 158 05/20/2013 0958   MCV 104.0* 05/20/2013 0958   MCH 34.4* 05/20/2013 0958   MCHC 33.1 05/20/2013 0958   RDW 20.6* 05/20/2013 0958   LYMPHSABS 2.3 05/20/2013 0958   MONOABS 0.5 05/20/2013 0958   EOSABS 0.3 05/20/2013 0958   BASOSABS 0.1 05/20/2013 0958   CMP     Component Value Date/Time   NA 142 05/20/2013 0958   K 4.0 05/20/2013 0958   CO2 24 05/20/2013 0958   GLUCOSE 204* 05/20/2013 0958   BUN 9.8 05/20/2013 0958   CREATININE 0.9 05/20/2013 0958   CALCIUM 9.9 05/20/2013 0958   PROT 7.6 05/20/2013 0958   ALBUMIN 4.1 05/20/2013 0958   AST 21 05/20/2013 0958   ALT 15 05/20/2013 0958   ALKPHOS 55 05/20/2013 0958   BILITOT 0.46 05/20/2013 0958    RADIOGRAPHIC STUDIES: RIGHT LOWER EXTREMITY VENOUS DUPLEX ULTRASOUND  (10/25/2011) Technique: Gray-scale sonography with graded compression, as well as color Doppler and duplex ultrasound, were performed to evaluate the deep venous system of the lower extremity from the level of the common femoral vein through the popliteal and proximal calf veins. Spectral Doppler was utilized to evaluate flow at rest and with distal augmentation maneuvers. Comparison: 06/02/2011 Findings: Residual echogenic intraluminal thrombus noted in the superficial femoral and popliteal veins. Partially occlusive thrombus persist at the saphenofemoral junction extending into the great saphenous vein. Slight interval decrease in thrombus burden throughout the right lower extremity deep veins and the superficial saphenous vein. No occlusive DVT demonstrated on today's study. IMPRESSION:  Residual partially occlusive right femoral popliteal DVT. Some interval improvement. Residual superficial thrombosis of the great saphenous vein, little interval change. Original Report Authenticated By: Jerilynn Mages. Daryll Brod, M.D.   ASSESSMENT: 1) Tobacco abuse (longstanding); 2) Polycythemia; 3) Right  lower extremity DVT  PLAN:  1. Polycythemia.  --We discussed with patient that his polycythemia can be secondary to smoking and again recommended to stop smoking. The patient agrees to continue reduce the amount smoking. He declined tobacco cessation referral presently.   JAK testing is negative and his EPO level is normal. He will require a phlebectomy today based on his labs.  He however requested to wait and increase his medications.   --As indicated above, we started hydrea 500 mg daily with a repeat CBC in 2 weeks.  He has normal creatinine.  We discussed the benefit of starting therapy would be to maintain his hct less than 45% and reduce his risk of further clots including CVA, MI and DVT.  His risk factors include his age.  Common side effects include myelosuppression, anorexia, n/v/d, constipation rash, drowsiness, ALT, AST elevated and renal impairment.  He understood the indications, benefits and risks and agreed to proceed. We will increase his hydrea to 1,000 mg daily given his request to avoid phlebetomy.   2. RLE DVT. --He will continue coumadin for treatment for his DVT and phlebotomies prn HCT greater than or equal to 45% q monthly.    3. Tobacco abuse.  --Likely contributing to #1.   He will continue to reduce the amount of smoking.   4. Follow-up. --He will return monthly for CBC and a return office visit in 2 months.  We discussed screening exams including colonoscopy.  The patient has not had a colonoscopy and declines to have one.  He understood the risks including presenting  with colon cancer at an advanced stage.   All questions were answered. The patient knows to call the clinic with any problems, questions or concerns. We can certainly see the patient much sooner if necessary.  I spent 15 minutes counseling the patient face to face. The total time spent in the appointment was 25 minutes.    Nicci Vaughan, MD 05/21/2013 5:28 AM

## 2013-06-17 ENCOUNTER — Other Ambulatory Visit (HOSPITAL_BASED_OUTPATIENT_CLINIC_OR_DEPARTMENT_OTHER): Payer: Medicare Other

## 2013-06-17 DIAGNOSIS — D751 Secondary polycythemia: Secondary | ICD-10-CM

## 2013-06-17 LAB — CBC WITH DIFFERENTIAL/PLATELET
BASO%: 0.8 % (ref 0.0–2.0)
Basophils Absolute: 0 10*3/uL (ref 0.0–0.1)
EOS%: 3.3 % (ref 0.0–7.0)
Eosinophils Absolute: 0.2 10*3/uL (ref 0.0–0.5)
HEMATOCRIT: 43 % (ref 38.4–49.9)
HGB: 14.5 g/dL (ref 13.0–17.1)
LYMPH%: 40.2 % (ref 14.0–49.0)
MCH: 35.6 pg — AB (ref 27.2–33.4)
MCHC: 33.7 g/dL (ref 32.0–36.0)
MCV: 105.7 fL — ABNORMAL HIGH (ref 79.3–98.0)
MONO#: 0.4 10*3/uL (ref 0.1–0.9)
MONO%: 8.3 % (ref 0.0–14.0)
NEUT#: 2.3 10*3/uL (ref 1.5–6.5)
NEUT%: 47.4 % (ref 39.0–75.0)
Platelets: 162 10*3/uL (ref 140–400)
RBC: 4.07 10*6/uL — ABNORMAL LOW (ref 4.20–5.82)
RDW: 23 % — ABNORMAL HIGH (ref 11.0–14.6)
WBC: 4.8 10*3/uL (ref 4.0–10.3)
lymph#: 1.9 10*3/uL (ref 0.9–3.3)

## 2013-07-15 ENCOUNTER — Ambulatory Visit (HOSPITAL_BASED_OUTPATIENT_CLINIC_OR_DEPARTMENT_OTHER): Payer: Medicare Other | Admitting: Internal Medicine

## 2013-07-15 ENCOUNTER — Telehealth: Payer: Self-pay | Admitting: Internal Medicine

## 2013-07-15 ENCOUNTER — Other Ambulatory Visit (HOSPITAL_BASED_OUTPATIENT_CLINIC_OR_DEPARTMENT_OTHER): Payer: Medicare Other

## 2013-07-15 VITALS — BP 145/60 | HR 73 | Temp 97.6°F | Resp 18 | Ht 66.0 in | Wt 131.6 lb

## 2013-07-15 DIAGNOSIS — F17200 Nicotine dependence, unspecified, uncomplicated: Secondary | ICD-10-CM

## 2013-07-15 DIAGNOSIS — D751 Secondary polycythemia: Secondary | ICD-10-CM

## 2013-07-15 DIAGNOSIS — Z720 Tobacco use: Secondary | ICD-10-CM

## 2013-07-15 DIAGNOSIS — I82409 Acute embolism and thrombosis of unspecified deep veins of unspecified lower extremity: Secondary | ICD-10-CM

## 2013-07-15 LAB — CBC WITH DIFFERENTIAL/PLATELET
BASO%: 0.7 % (ref 0.0–2.0)
Basophils Absolute: 0 10*3/uL (ref 0.0–0.1)
EOS%: 2.4 % (ref 0.0–7.0)
Eosinophils Absolute: 0.1 10*3/uL (ref 0.0–0.5)
HCT: 39.7 % (ref 38.4–49.9)
HGB: 13.5 g/dL (ref 13.0–17.1)
LYMPH#: 1.7 10*3/uL (ref 0.9–3.3)
LYMPH%: 42 % (ref 14.0–49.0)
MCH: 39.2 pg — AB (ref 27.2–33.4)
MCHC: 33.9 g/dL (ref 32.0–36.0)
MCV: 115.5 fL — ABNORMAL HIGH (ref 79.3–98.0)
MONO#: 0.3 10*3/uL (ref 0.1–0.9)
MONO%: 7.8 % (ref 0.0–14.0)
NEUT#: 2 10*3/uL (ref 1.5–6.5)
NEUT%: 47.1 % (ref 39.0–75.0)
Platelets: 166 10*3/uL (ref 140–400)
RBC: 3.44 10*6/uL — AB (ref 4.20–5.82)
RDW: 27.3 % — AB (ref 11.0–14.6)
WBC: 4.2 10*3/uL (ref 4.0–10.3)

## 2013-07-15 LAB — COMPREHENSIVE METABOLIC PANEL (CC13)
ALBUMIN: 4.1 g/dL (ref 3.5–5.0)
ALT: 15 U/L (ref 0–55)
ANION GAP: 10 meq/L (ref 3–11)
AST: 20 U/L (ref 5–34)
Alkaline Phosphatase: 49 U/L (ref 40–150)
BUN: 9.6 mg/dL (ref 7.0–26.0)
CHLORIDE: 108 meq/L (ref 98–109)
CO2: 23 mEq/L (ref 22–29)
Calcium: 9.5 mg/dL (ref 8.4–10.4)
Creatinine: 0.9 mg/dL (ref 0.7–1.3)
Glucose: 194 mg/dl — ABNORMAL HIGH (ref 70–140)
POTASSIUM: 4.1 meq/L (ref 3.5–5.1)
SODIUM: 141 meq/L (ref 136–145)
Total Bilirubin: 0.59 mg/dL (ref 0.20–1.20)
Total Protein: 7.2 g/dL (ref 6.4–8.3)

## 2013-07-15 NOTE — Telephone Encounter (Signed)
gv pt appt schedule for june/july. °

## 2013-07-15 NOTE — Progress Notes (Signed)
[facility] OFFICE PROGRESS NOTE  [name], MD [place] [address]  DIAGNOSIS: Polycythemia, secondary - Plan: CBC with Differential, CBC with Differential, Comprehensive metabolic panel (Cmet) - [facility]  DVT, lower extremity - Plan: CBC with Differential, CBC with Differential, Comprehensive metabolic panel (Cmet) - [facility]  Tobacco abuse - Plan: CBC with Differential, CBC with Differential, Comprehensive metabolic panel (Cmet) - [facility]  Chief Complaint  Patient presents with  . Follow-up    CURRENT THERAPY:  Phlebotomy to maintain a hemocrit less than 45%. Hydrea 500 mg daily started on 03/25/2013 but increased to 1,000 mg daily about 6 weeks ago.   INTERVAL HISTORY: Isaiah York 68 y.o. male  who was initially sent to Dr. Erskine Speed on 10/04/2012 for evaluation of polycythemia. Today he is accompanied by his daughter.   He was last seen by me on 05/20/2013. On 09/14/2012 his hemoglobin was 20.5, hematocrit was 56.8. The patient is smoker. He smokes greater than 1/2 PPD for 54 years. He has good appetite. His weight is stable.  He reported history headaches but when he started to control blood pressure it is gone. The patient denied fever, chills, night sweats, change in appetite or weight.He denied headaches since his blood pressure under control, double vision, blurry vision, nasal congestion, nasal discharge, hearing problems, odynophagia or dysphagia. No chest pain, palpitations, dyspnea, cough, abdominal pain, nausea, vomiting, diarrhea, constipation, hematochezia. The patient denied dysuria, nocturia, polyuria, hematuria, myalgia, numbness, tingling, psychiatric problems. He denies pruritus or headaches following his showers.    Today, he is accompanied by his daughter.  He was increased on hydrea last visit to two tablets once a day.  He reports feeling fine. He was smoking one half a pack a day down now to 3 cigarettes per day.  He denies any  hospitalizations or emergency room visits. His last phlebotomy was 04/22/2013.   MEDICAL HISTORY: Past Medical History  Diagnosis Date  . HTN (hypertension)     INTERIM HISTORY: has Polycythemia, secondary; Tobacco abuse; and DVT, lower extremity on his problem list.    ALLERGIES:  is allergic to tape.  MEDICATIONS: has a current medication list which includes the following prescription(s): amlodipine, folic acid, hydroxyurea, losartan, and warfarin.  SURGICAL HISTORY: No past surgical history on file.  REVIEW OF SYSTEMS:   Constitutional: Denies fevers, chills or abnormal weight loss Eyes: Denies blurriness of vision Ears, nose, mouth, throat, and face: Denies mucositis or sore throat Respiratory: Denies cough, dyspnea or wheezes Cardiovascular: Denies palpitation, chest discomfort or lower extremity swelling Gastrointestinal:  Denies nausea, heartburn or change in bowel habits Skin: Denies abnormal skin rashes Lymphatics: Denies new lymphadenopathy or easy bruising Neurological:Denies numbness, tingling or new weaknesses Behavioral/Psych: Mood is stable, no new changes  All other systems were reviewed with the patient and are negative.  PHYSICAL EXAMINATION: ECOG PERFORMANCE STATUS: 0 - Asymptomatic  Blood pressure 145/60, pulse 73, temperature 97.6 F (36.4 C), temperature source Oral, resp. rate 18, height 5\' 6"  (1.676 m), weight 131 lb 9.6 oz (59.693 kg), SpO2 100.00%.  GENERAL:alert, no distress and comfortable elderly who appears his stated age; thin SKIN: skin color, texture, turgor are normal, no rashes or significant lesions EYES: normal, Conjunctiva are pink and non-injected, sclera clear OROPHARYNX:no exudate, no erythema and lips, buccal mucosa, and tongue normal  NECK: supple, thyroid normal size, non-tender, without nodularity LYMPH:  no palpable lymphadenopathy in the cervical, axillary or inguinal LUNGS: clear to auscultation and percussion with normal  breathing effort HEART: regular rate & rhythm and no murmurs and no lower extremity edema ABDOMEN:abdomen soft, non-tender and normal bowel sounds Musculoskeletal:no cyanosis of digits and no clubbing  NEURO: alert & oriented x 3 with fluent speech, no focal motor/sensory deficits   LABORATORY DATA: CBC    Component Value Date/Time   WBC 4.2 07/15/2013 0935   RBC 3.44* 07/15/2013 0935   HGB 13.5 07/15/2013 0935   HCT 39.7 07/15/2013 0935   PLT 166 07/15/2013 0935   MCV 115.5* 07/15/2013 0935   MCH 39.2* 07/15/2013 0935   MCHC 33.9 07/15/2013 0935   RDW 27.3* 07/15/2013 0935   LYMPHSABS 1.7 07/15/2013 0935   MONOABS 0.3 07/15/2013 0935   EOSABS 0.1 07/15/2013 0935   BASOSABS 0.0 07/15/2013 0935   CMP     Component Value Date/Time   NA 141 07/15/2013 0935   K 4.1 07/15/2013 0935   CO2 23 07/15/2013 0935   GLUCOSE 194* 07/15/2013 0935   BUN 9.6 07/15/2013 0935   CREATININE 0.9 07/15/2013 0935   CALCIUM 9.5 07/15/2013 0935   PROT 7.2 07/15/2013 0935   ALBUMIN 4.1 07/15/2013 0935   AST 20 07/15/2013 0935   ALT 15 07/15/2013 0935   ALKPHOS 49 07/15/2013 0935   BILITOT 0.59 07/15/2013 0935    RADIOGRAPHIC STUDIES: RIGHT LOWER EXTREMITY VENOUS DUPLEX ULTRASOUND  (10/25/2011) Technique: Gray-scale sonography with graded compression, as well as color Doppler and duplex ultrasound, were performed to evaluate the deep venous system of the lower extremity from the level of the common femoral vein through the popliteal and proximal calf veins. Spectral Doppler was utilized to evaluate flow at rest and with distal augmentation maneuvers. Comparison: 06/02/2011 Findings: Residual echogenic intraluminal thrombus noted in the superficial femoral and popliteal veins. Partially occlusive thrombus persist at the saphenofemoral junction extending into the great saphenous vein. Slight interval decrease in thrombus burden throughout the right lower extremity deep veins and the superficial saphenous vein. No occlusive DVT  demonstrated on today's study. IMPRESSION:  Residual partially occlusive right femoral popliteal DVT. Some interval improvement. Residual superficial thrombosis of the great saphenous vein, little interval change. Original Report Authenticated By: Jerilynn Mages. Daryll Brod, M.D.   ASSESSMENT: 1) Tobacco abuse (longstanding); 2) Polycythemia; 3) Right lower extremity DVT  PLAN:  1. Polycythemia.  --We discussed with patient that his polycythemia can be secondary to smoking and again recommended to stop smoking. The patient agrees to continue reduce the amount smoking. He declined tobacco cessation referral presently.   JAK testing is negative and his EPO level is normal.  -- He has normal creatinine.  We discussed the benefit of starting therapy would be to maintain his hct less than 45% and reduce his risk of further clots including CVA, MI and DVT.  His risk factors include his age.  Common side effects include myelosuppression, anorexia, n/v/d, constipation rash, drowsiness, ALT, AST elevated and renal impairment.  He understood the indications, benefits and risks and agreed to proceed. We increased his hydrea to 1,000 mg daily given his request to avoid phlebetomy. We will recheck labs monthly to establish hemoglobin trend.  We will alternate 1,000 mg every other day with 500 mg every other day if his hemoglobin continues to decrease on present dose.   2. RLE DVT. --He will continue coumadin for treatment for his DVT and phlebotomies prn HCT greater than or equal to 45% q monthly.    3. Tobacco abuse.  --Likely contributing to #1.   He will continue to  reduce the amount of smoking.   4. Follow-up. --He will return monthly for CBC and a return office visit in 2 months.  We discussed screening exams including colonoscopy.  The patient has not had a colonoscopy and declines to have one.  He understood the risks including presenting with colon cancer at an advanced stage.   All questions were answered. The  patient knows to call the clinic with any problems, questions or concerns. We can certainly see the patient much sooner if necessary.  I spent 10 minutes counseling the patient face to face. The total time spent in the appointment was 15 minutes.    Concha Norway, MD 07/15/2013 10:46 AM

## 2013-07-29 ENCOUNTER — Other Ambulatory Visit: Payer: Self-pay | Admitting: Internal Medicine

## 2013-08-14 ENCOUNTER — Other Ambulatory Visit (HOSPITAL_BASED_OUTPATIENT_CLINIC_OR_DEPARTMENT_OTHER): Payer: Medicare Other

## 2013-08-14 ENCOUNTER — Telehealth: Payer: Self-pay | Admitting: Internal Medicine

## 2013-08-14 DIAGNOSIS — D751 Secondary polycythemia: Secondary | ICD-10-CM

## 2013-08-14 DIAGNOSIS — I82409 Acute embolism and thrombosis of unspecified deep veins of unspecified lower extremity: Secondary | ICD-10-CM

## 2013-08-14 DIAGNOSIS — Z720 Tobacco use: Secondary | ICD-10-CM

## 2013-08-14 LAB — CBC WITH DIFFERENTIAL/PLATELET
BASO%: 1 % (ref 0.0–2.0)
Basophils Absolute: 0 10*3/uL (ref 0.0–0.1)
EOS%: 2.4 % (ref 0.0–7.0)
Eosinophils Absolute: 0.1 10*3/uL (ref 0.0–0.5)
HCT: 31.2 % — ABNORMAL LOW (ref 38.4–49.9)
HEMOGLOBIN: 11 g/dL — AB (ref 13.0–17.1)
LYMPH#: 1.8 10*3/uL (ref 0.9–3.3)
LYMPH%: 43 % (ref 14.0–49.0)
MCH: 43 pg — ABNORMAL HIGH (ref 27.2–33.4)
MCHC: 35.3 g/dL (ref 32.0–36.0)
MCV: 121.9 fL — ABNORMAL HIGH (ref 79.3–98.0)
MONO#: 0.3 10*3/uL (ref 0.1–0.9)
MONO%: 6.3 % (ref 0.0–14.0)
NEUT#: 2 10*3/uL (ref 1.5–6.5)
NEUT%: 47.3 % (ref 39.0–75.0)
NRBC: 0 % (ref 0–0)
Platelets: 91 10*3/uL — ABNORMAL LOW (ref 140–400)
RBC: 2.56 10*6/uL — ABNORMAL LOW (ref 4.20–5.82)
RDW: 19.7 % — ABNORMAL HIGH (ref 11.0–14.6)
WBC: 4.1 10*3/uL (ref 4.0–10.3)

## 2013-08-14 NOTE — Telephone Encounter (Signed)
Left voice mail message to call [phone].  Recommended to decrease hydrea to 1,000 mg daily alternating with 500 mg daily (on Tues, Thursday and Sat).

## 2013-08-15 ENCOUNTER — Encounter: Payer: Self-pay | Admitting: Medical Oncology

## 2013-08-15 ENCOUNTER — Telehealth: Payer: Self-pay | Admitting: Medical Oncology

## 2013-08-15 NOTE — Telephone Encounter (Signed)
Pt's wife called to verify the dose change of hydrea. She did voice pt will take 1000 mg except Tues, Thurs and Sat he will take 500 mg. Instructed her to call if any questions or concerns.

## 2013-09-09 ENCOUNTER — Encounter: Payer: Self-pay | Admitting: Internal Medicine

## 2013-09-09 ENCOUNTER — Telehealth: Payer: Self-pay | Admitting: Internal Medicine

## 2013-09-09 ENCOUNTER — Other Ambulatory Visit (HOSPITAL_BASED_OUTPATIENT_CLINIC_OR_DEPARTMENT_OTHER): Payer: Medicare Other

## 2013-09-09 ENCOUNTER — Ambulatory Visit (HOSPITAL_BASED_OUTPATIENT_CLINIC_OR_DEPARTMENT_OTHER): Payer: Medicare Other | Admitting: Internal Medicine

## 2013-09-09 VITALS — BP 165/64 | HR 80 | Temp 97.4°F | Resp 17 | Ht 66.0 in | Wt 134.3 lb

## 2013-09-09 DIAGNOSIS — I82409 Acute embolism and thrombosis of unspecified deep veins of unspecified lower extremity: Secondary | ICD-10-CM

## 2013-09-09 DIAGNOSIS — I82401 Acute embolism and thrombosis of unspecified deep veins of right lower extremity: Secondary | ICD-10-CM

## 2013-09-09 DIAGNOSIS — F17200 Nicotine dependence, unspecified, uncomplicated: Secondary | ICD-10-CM

## 2013-09-09 DIAGNOSIS — Z720 Tobacco use: Secondary | ICD-10-CM

## 2013-09-09 DIAGNOSIS — D751 Secondary polycythemia: Secondary | ICD-10-CM

## 2013-09-09 LAB — CBC WITH DIFFERENTIAL/PLATELET
BASO%: 0.9 % (ref 0.0–2.0)
Basophils Absolute: 0 10*3/uL (ref 0.0–0.1)
EOS%: 1.7 % (ref 0.0–7.0)
Eosinophils Absolute: 0.1 10*3/uL (ref 0.0–0.5)
HCT: 29.6 % — ABNORMAL LOW (ref 38.4–49.9)
HGB: 10.2 g/dL — ABNORMAL LOW (ref 13.0–17.1)
LYMPH%: 50.5 % — ABNORMAL HIGH (ref 14.0–49.0)
MCH: 47.9 pg — ABNORMAL HIGH (ref 27.2–33.4)
MCHC: 34.4 g/dL (ref 32.0–36.0)
MCV: 139.3 fL — ABNORMAL HIGH (ref 79.3–98.0)
MONO#: 0.4 10*3/uL (ref 0.1–0.9)
MONO%: 10.5 % (ref 0.0–14.0)
NEUT#: 1.3 10*3/uL — ABNORMAL LOW (ref 1.5–6.5)
NEUT%: 36.4 % — ABNORMAL LOW (ref 39.0–75.0)
Platelets: 167 10*3/uL (ref 140–400)
RBC: 2.12 10*6/uL — ABNORMAL LOW (ref 4.20–5.82)
RDW: 19.7 % — ABNORMAL HIGH (ref 11.0–14.6)
WBC: 3.7 10*3/uL — ABNORMAL LOW (ref 4.0–10.3)
lymph#: 1.9 10*3/uL (ref 0.9–3.3)

## 2013-09-09 LAB — COMPREHENSIVE METABOLIC PANEL (CC13)
ALBUMIN: 4 g/dL (ref 3.5–5.0)
ALT: 14 U/L (ref 0–55)
AST: 22 U/L (ref 5–34)
Alkaline Phosphatase: 52 U/L (ref 40–150)
Anion Gap: 8 mEq/L (ref 3–11)
BUN: 11.5 mg/dL (ref 7.0–26.0)
CALCIUM: 9.1 mg/dL (ref 8.4–10.4)
CHLORIDE: 109 meq/L (ref 98–109)
CO2: 22 mEq/L (ref 22–29)
Creatinine: 0.9 mg/dL (ref 0.7–1.3)
GLUCOSE: 169 mg/dL — AB (ref 70–140)
Potassium: 3.9 mEq/L (ref 3.5–5.1)
Sodium: 140 mEq/L (ref 136–145)
Total Bilirubin: 0.44 mg/dL (ref 0.20–1.20)
Total Protein: 7.1 g/dL (ref 6.4–8.3)

## 2013-09-09 NOTE — Telephone Encounter (Signed)
Gave pt appt for lab and MD for july and August, pt prefers a monday appt moved to 8/11

## 2013-09-09 NOTE — Progress Notes (Signed)
[facility] OFFICE PROGRESS NOTE  [name], MD [place] [address]  DIAGNOSIS: Polycythemia, secondary - Plan: CBC with Differential, Comprehensive metabolic panel (Cmet) - [facility], Lactate dehydrogenase (LDH) - [facility], CBC with Differential  Tobacco abuse  DVT, lower extremity, right  Chief Complaint  Patient presents with  . Follow-up    CURRENT THERAPY:  Phlebotomy to maintain a hemocrit less than 45%. Hydrea 500 mg daily started on 03/25/2013 but increased to 1,000 mg daily about 6 weeks ago. Based on decreasing hemoglobin recommended 500 mg alternating with 1,000 on odd even days on 08/14/2013.   INTERVAL HISTORY: Isaiah York 68 y.o. male  who was initially sent to Dr. Erskine Speed on 10/04/2012 for evaluation of polycythemia. Today he is accompanied by his daughter.   He was last seen by me on 07/15/2013. On 09/14/2012 his hemoglobin was 20.5, hematocrit was 56.8. The patient is smoker. He smokes greater than 1/2 PPD for 54 years. He has good appetite. His weight is stable.  He reported history headaches but when he started to control blood pressure it is gone. The patient denied fever, chills, night sweats, change in appetite or weight.He denied headaches since his blood pressure under control, double vision, blurry vision, nasal congestion, nasal discharge, hearing problems, odynophagia or dysphagia. No chest pain, palpitations, dyspnea, cough, abdominal pain, nausea, vomiting, diarrhea, constipation, hematochezia. The patient denied dysuria, nocturia, polyuria, hematuria, myalgia, numbness, tingling, psychiatric problems. He denies pruritus or headaches following his showers.    Today, he is accompanied by his daughter.  He decreased his hydrea based on a drop in his plts and hemoglobin on 08/14/2013.  He reports feeling fine. He is smoking one half a pack a day.  He denies any hospitalizations or emergency room visits. His last phlebotomy  was 04/22/2013.   MEDICAL HISTORY: Past Medical History  Diagnosis Date  . HTN (hypertension)     INTERIM HISTORY: has Polycythemia, secondary; Tobacco abuse; and DVT, lower extremity on his problem list.    ALLERGIES:  is allergic to tape.  MEDICATIONS: has a current medication list which includes the following prescription(s): amlodipine, folic acid, hydroxyurea, losartan, and warfarin.  SURGICAL HISTORY: History reviewed. No pertinent past surgical history.  REVIEW OF SYSTEMS:   Constitutional: Denies fevers, chills or abnormal weight loss Eyes: Denies blurriness of vision Ears, nose, mouth, throat, and face: Denies mucositis or sore throat Respiratory: Denies cough, dyspnea or wheezes Cardiovascular: Denies palpitation, chest discomfort or lower extremity swelling Gastrointestinal:  Denies nausea, heartburn or change in bowel habits Skin: Denies abnormal skin rashes Lymphatics: Denies new lymphadenopathy or easy bruising Neurological:Denies numbness, tingling or new weaknesses Behavioral/Psych: Mood is stable, no new changes  All other systems were reviewed with the patient and are negative.  PHYSICAL EXAMINATION: ECOG PERFORMANCE STATUS: 0 - Asymptomatic  Blood pressure 165/64, pulse 80, temperature 97.4 F (36.3 C), temperature source Oral, resp. rate 17, height 5\' 6"  (1.676 m), weight 134 lb 4.8 oz (60.918 kg), SpO2 100.00%.  GENERAL:alert, no distress and comfortable elderly who appears his stated age; thin SKIN: skin color, texture, turgor are normal, no rashes or significant lesions EYES: normal, Conjunctiva are pink and non-injected, sclera clear OROPHARYNX:no exudate, no erythema and lips, buccal mucosa, and tongue normal  NECK: supple, thyroid normal size, non-tender, without nodularity LYMPH:  no palpable lymphadenopathy in the cervical, axillary or inguinal LUNGS: clear to auscultation and percussion with normal breathing effort HEART: regular rate & rhythm  and no  murmurs and no lower extremity edema ABDOMEN:abdomen soft, non-tender and normal bowel sounds Musculoskeletal:no cyanosis of digits and no clubbing  NEURO: alert & oriented x 3 with fluent speech, no focal motor/sensory deficits   LABORATORY DATA: CBC    Component Value Date/Time   WBC 3.7* 09/09/2013 0940   RBC 2.12* 09/09/2013 0940   HGB 10.2* 09/09/2013 0940   HCT 29.6* 09/09/2013 0940   PLT 167 09/09/2013 0940   MCV 139.3* 09/09/2013 0940   MCH 47.9* 09/09/2013 0940   MCHC 34.4 09/09/2013 0940   RDW 19.7* 09/09/2013 0940   LYMPHSABS 1.9 09/09/2013 0940   MONOABS 0.4 09/09/2013 0940   EOSABS 0.1 09/09/2013 0940   BASOSABS 0.0 09/09/2013 0940   CMP     Component Value Date/Time   NA 140 09/09/2013 0940   K 3.9 09/09/2013 0940   CO2 22 09/09/2013 0940   GLUCOSE 169* 09/09/2013 0940   BUN 11.5 09/09/2013 0940   CREATININE 0.9 09/09/2013 0940   CALCIUM 9.1 09/09/2013 0940   PROT 7.1 09/09/2013 0940   ALBUMIN 4.0 09/09/2013 0940   AST 22 09/09/2013 0940   ALT 14 09/09/2013 0940   ALKPHOS 52 09/09/2013 0940   BILITOT 0.44 09/09/2013 0940    RADIOGRAPHIC STUDIES: RIGHT LOWER EXTREMITY VENOUS DUPLEX ULTRASOUND  (10/25/2011) Technique: Gray-scale sonography with graded compression, as well as color Doppler and duplex ultrasound, were performed to evaluate the deep venous system of the lower extremity from the level of the common femoral vein through the popliteal and proximal calf veins. Spectral Doppler was utilized to evaluate flow at rest and with distal augmentation maneuvers. Comparison: 06/02/2011 Findings: Residual echogenic intraluminal thrombus noted in the superficial femoral and popliteal veins. Partially occlusive thrombus persist at the saphenofemoral junction extending into the great saphenous vein. Slight interval decrease in thrombus burden throughout the right lower extremity deep veins and the superficial saphenous vein. No occlusive DVT demonstrated on today's study. IMPRESSION:  Residual partially  occlusive right femoral popliteal DVT. Some interval improvement. Residual superficial thrombosis of the great saphenous vein, little interval change. Original Report Authenticated By: Jerilynn Mages. Daryll Brod, M.D.   ASSESSMENT: 1) Tobacco abuse (longstanding); 2) Polycythemia; 3) Right lower extremity DVT  PLAN:  1. Polycythemia.  --We discussed with patient that his polycythemia can be secondary to smoking and again recommended to stop smoking. The patient agrees to continue reduce the amount smoking. He declined tobacco cessation referral presently.   JAK testing is negative and his EPO level is normal.  -- He has normal creatinine.  We discussed the benefit of starting therapy would be to maintain his hct less than 45% and reduce his risk of further clots including CVA, MI and DVT.  His risk factors include his age.  Common side effects include myelosuppression, anorexia, n/v/d, constipation rash, drowsiness, ALT, AST elevated and renal impairment.  He understood the indications, benefits and risks and agreed to proceed.   --Given his decreasing WBC and hemoglobin, we recommended a one-week treatment holiday then resume at 500 mg daily.  We will repeat labs in two weeks and upon return visit in one month.    2. RLE DVT. --He will continue coumadin for treatment for his DVT and phlebotomies prn HCT greater than or equal to 45% q monthly.    3. Tobacco abuse.  --Likely contributing to #1.   He will continue to reduce the amount of smoking.   4. Follow-up. --He will return biweekly for CBC and a return office visit  in one month.  We discussed screening exams including colonoscopy.  The patient has not had a colonoscopy and declines to have one.  He understood the risks including presenting with colon cancer at an advanced stage.   All questions were answered. The patient knows to call the clinic with any problems, questions or concerns. We can certainly see the patient much sooner if necessary.  I  spent 10 minutes counseling the patient face to face. The total time spent in the appointment was 15 minutes.    Cadynce Garrette, MD 09/09/2013 10:48 AM

## 2013-09-23 ENCOUNTER — Other Ambulatory Visit (HOSPITAL_BASED_OUTPATIENT_CLINIC_OR_DEPARTMENT_OTHER): Payer: Medicare Other

## 2013-09-23 DIAGNOSIS — D751 Secondary polycythemia: Secondary | ICD-10-CM

## 2013-09-23 LAB — CBC WITH DIFFERENTIAL/PLATELET
BASO%: 1.2 % (ref 0.0–2.0)
BASOS ABS: 0 10*3/uL (ref 0.0–0.1)
EOS%: 4 % (ref 0.0–7.0)
Eosinophils Absolute: 0.2 10*3/uL (ref 0.0–0.5)
HCT: 31.1 % — ABNORMAL LOW (ref 38.4–49.9)
HEMOGLOBIN: 10.6 g/dL — AB (ref 13.0–17.1)
LYMPH#: 1.8 10*3/uL (ref 0.9–3.3)
LYMPH%: 47.6 % (ref 14.0–49.0)
MCH: 48 pg — ABNORMAL HIGH (ref 27.2–33.4)
MCHC: 33.9 g/dL (ref 32.0–36.0)
MCV: 141.5 fL — ABNORMAL HIGH (ref 79.3–98.0)
MONO#: 0.4 10*3/uL (ref 0.1–0.9)
MONO%: 9.4 % (ref 0.0–14.0)
NEUT#: 1.4 10*3/uL — ABNORMAL LOW (ref 1.5–6.5)
NEUT%: 37.8 % — ABNORMAL LOW (ref 39.0–75.0)
Platelets: 180 10*3/uL (ref 140–400)
RBC: 2.2 10*6/uL — ABNORMAL LOW (ref 4.20–5.82)
RDW: 16.8 % — AB (ref 11.0–14.6)
WBC: 3.8 10*3/uL — ABNORMAL LOW (ref 4.0–10.3)

## 2013-09-24 ENCOUNTER — Other Ambulatory Visit: Payer: Self-pay | Admitting: Internal Medicine

## 2013-10-07 ENCOUNTER — Telehealth: Payer: Self-pay | Admitting: Internal Medicine

## 2013-10-07 NOTE — Telephone Encounter (Signed)
, °

## 2013-10-15 ENCOUNTER — Other Ambulatory Visit: Payer: Medicare Other

## 2013-10-15 ENCOUNTER — Ambulatory Visit: Payer: Medicare Other

## 2013-10-17 ENCOUNTER — Ambulatory Visit: Payer: Medicare Other

## 2013-10-17 ENCOUNTER — Other Ambulatory Visit: Payer: Medicare Other

## 2013-10-21 ENCOUNTER — Ambulatory Visit (HOSPITAL_BASED_OUTPATIENT_CLINIC_OR_DEPARTMENT_OTHER): Payer: Medicare Other | Admitting: Internal Medicine

## 2013-10-21 ENCOUNTER — Telehealth: Payer: Self-pay | Admitting: Internal Medicine

## 2013-10-21 ENCOUNTER — Encounter: Payer: Self-pay | Admitting: Internal Medicine

## 2013-10-21 ENCOUNTER — Other Ambulatory Visit (HOSPITAL_BASED_OUTPATIENT_CLINIC_OR_DEPARTMENT_OTHER): Payer: Medicare Other

## 2013-10-21 VITALS — BP 155/72 | HR 76 | Temp 97.4°F | Resp 17 | Ht 66.0 in | Wt 132.9 lb

## 2013-10-21 DIAGNOSIS — D751 Secondary polycythemia: Secondary | ICD-10-CM

## 2013-10-21 DIAGNOSIS — I82401 Acute embolism and thrombosis of unspecified deep veins of right lower extremity: Secondary | ICD-10-CM

## 2013-10-21 DIAGNOSIS — Z720 Tobacco use: Secondary | ICD-10-CM

## 2013-10-21 DIAGNOSIS — F17200 Nicotine dependence, unspecified, uncomplicated: Secondary | ICD-10-CM

## 2013-10-21 DIAGNOSIS — I82409 Acute embolism and thrombosis of unspecified deep veins of unspecified lower extremity: Secondary | ICD-10-CM

## 2013-10-21 LAB — CBC WITH DIFFERENTIAL/PLATELET
BASO%: 0.8 % (ref 0.0–2.0)
BASOS ABS: 0 10*3/uL (ref 0.0–0.1)
EOS ABS: 0.2 10*3/uL (ref 0.0–0.5)
EOS%: 4.9 % (ref 0.0–7.0)
HCT: 39.7 % (ref 38.4–49.9)
HEMOGLOBIN: 13.4 g/dL (ref 13.0–17.1)
LYMPH%: 36.6 % (ref 14.0–49.0)
MCH: 46.8 pg — ABNORMAL HIGH (ref 27.2–33.4)
MCHC: 33.6 g/dL (ref 32.0–36.0)
MCV: 139.4 fL — AB (ref 79.3–98.0)
MONO#: 0.4 10*3/uL (ref 0.1–0.9)
MONO%: 9.4 % (ref 0.0–14.0)
NEUT%: 48.3 % (ref 39.0–75.0)
NEUTROS ABS: 2.2 10*3/uL (ref 1.5–6.5)
Platelets: 183 10*3/uL (ref 140–400)
RBC: 2.85 10*6/uL — ABNORMAL LOW (ref 4.20–5.82)
RDW: 13.5 % (ref 11.0–14.6)
WBC: 4.5 10*3/uL (ref 4.0–10.3)
lymph#: 1.6 10*3/uL (ref 0.9–3.3)

## 2013-10-21 LAB — COMPREHENSIVE METABOLIC PANEL (CC13)
ALBUMIN: 4.3 g/dL (ref 3.5–5.0)
ALT: 14 U/L (ref 0–55)
AST: 20 U/L (ref 5–34)
Alkaline Phosphatase: 50 U/L (ref 40–150)
Anion Gap: 11 mEq/L (ref 3–11)
BUN: 11.6 mg/dL (ref 7.0–26.0)
CALCIUM: 9.5 mg/dL (ref 8.4–10.4)
CHLORIDE: 108 meq/L (ref 98–109)
CO2: 23 meq/L (ref 22–29)
Creatinine: 1 mg/dL (ref 0.7–1.3)
Glucose: 135 mg/dl (ref 70–140)
Potassium: 4.2 mEq/L (ref 3.5–5.1)
Sodium: 142 mEq/L (ref 136–145)
Total Bilirubin: 0.5 mg/dL (ref 0.20–1.20)
Total Protein: 7.5 g/dL (ref 6.4–8.3)

## 2013-10-21 LAB — LACTATE DEHYDROGENASE (CC13): LDH: 197 U/L (ref 125–245)

## 2013-10-21 NOTE — Telephone Encounter (Signed)
Pt confirmed labs/ov per 08/17 POF, gave pt AVS.....KJ °

## 2013-10-21 NOTE — Progress Notes (Signed)
[facility] OFFICE PROGRESS NOTE  [name], MD [place] [address]  DIAGNOSIS: Polycythemia, secondary - Plan: CBC with Differential, Basic metabolic panel (Bmet) - [facility]  Tobacco abuse  DVT, lower extremity, right  Chief Complaint  Patient presents with  . Polycythemia, secondary    CURRENT THERAPY:  Phlebotomy to maintain a hemocrit less than 45%. Hydrea 500 mg daily started on 03/25/2013 but increased to 1,000 mg daily about 6 weeks ago. Based on decreasing hemoglobin recommended 500 mg alternating with 1,000 on odd even days on 08/14/2013. He is now back on hydrea 500 mg daily.   INTERVAL HISTORY: Isaiah York 68 y.o. male  who was initially sent to Dr. Erskine Speed on 10/04/2012 for evaluation of polycythemia. Today he is accompanied by his daughter.   He was last seen by me on 09/09/2013. On 09/14/2012 his hemoglobin was 20.5, hematocrit was 56.8. The patient is smoker. He smokes greater than 1/2 PPD for 54 years. He has good appetite. His weight is stable.  He reported history headaches but when he started to control blood pressure it is gone. The patient denied fever, chills, night sweats, change in appetite or weight.He denied headaches since his blood pressure under control, double vision, blurry vision, nasal congestion, nasal discharge, hearing problems, odynophagia or dysphagia. No chest pain, palpitations, dyspnea, cough, abdominal pain, nausea, vomiting, diarrhea, constipation, hematochezia. The patient denied dysuria, nocturia, polyuria, hematuria, myalgia, numbness, tingling, psychiatric problems. He denies pruritus or headaches following his showers.    Today, he is accompanied by his daughter.  He is taking one tablet by mouth daily.  He reports feeling fine. He is smoking one half a pack a day.  He denies any hospitalizations or emergency room visits. His last phlebotomy was 04/22/2013.   MEDICAL HISTORY: Past Medical History   Diagnosis Date  . HTN (hypertension)     INTERIM HISTORY: has Polycythemia, secondary; Tobacco abuse; and DVT, lower extremity on his problem list.    ALLERGIES:  is allergic to tape.  MEDICATIONS: has a current medication list which includes the following prescription(s): amlodipine, folic acid, hydroxyurea, losartan, and warfarin.  SURGICAL HISTORY: No past surgical history on file.  REVIEW OF SYSTEMS:   Constitutional: Denies fevers, chills or abnormal weight loss Eyes: Denies blurriness of vision Ears, nose, mouth, throat, and face: Denies mucositis or sore throat Respiratory: Denies cough, dyspnea or wheezes Cardiovascular: Denies palpitation, chest discomfort or lower extremity swelling Gastrointestinal:  Denies nausea, heartburn or change in bowel habits Skin: Denies abnormal skin rashes Lymphatics: Denies new lymphadenopathy or easy bruising Neurological:Denies numbness, tingling or new weaknesses Behavioral/Psych: Mood is stable, no new changes  All other systems were reviewed with the patient and are negative.  PHYSICAL EXAMINATION: ECOG PERFORMANCE STATUS: 0 - Asymptomatic  Blood pressure 155/72, pulse 76, temperature 97.4 F (36.3 C), temperature source Oral, resp. rate 17, height 5\' 6"  (1.676 m), weight 132 lb 14.4 oz (60.283 kg), SpO2 100.00%.  GENERAL:alert, no distress and comfortable elderly who appears his stated age; thin SKIN: skin color, texture, turgor are normal, no rashes or significant lesions EYES: normal, Conjunctiva are pink and non-injected, sclera clear OROPHARYNX:no exudate, no erythema and lips, buccal mucosa, and tongue normal  NECK: supple, thyroid normal size, non-tender, without nodularity LYMPH:  no palpable lymphadenopathy in the cervical, axillary or inguinal LUNGS: clear to auscultation and percussion with normal breathing effort HEART: regular rate & rhythm and no murmurs and no lower extremity edema ABDOMEN:abdomen  soft, non-tender  and normal bowel sounds Musculoskeletal:no cyanosis of digits and no clubbing  NEURO: alert & oriented x 3 with fluent speech, no focal motor/sensory deficits   LABORATORY DATA: CBC    Component Value Date/Time   WBC 4.5 10/21/2013 0914   RBC 2.85* 10/21/2013 0914   HGB 13.4 10/21/2013 0914   HCT 39.7 10/21/2013 0914   PLT 183 10/21/2013 0914   MCV 139.4* 10/21/2013 0914   MCH 46.8* 10/21/2013 0914   MCHC 33.6 10/21/2013 0914   RDW 13.5 10/21/2013 0914   LYMPHSABS 1.6 10/21/2013 0914   MONOABS 0.4 10/21/2013 0914   EOSABS 0.2 10/21/2013 0914   BASOSABS 0.0 10/21/2013 0914   CMP     Component Value Date/Time   NA 142 10/21/2013 0914   K 4.2 10/21/2013 0914   CO2 23 10/21/2013 0914   GLUCOSE 135 10/21/2013 0914   BUN 11.6 10/21/2013 0914   CREATININE 1.0 10/21/2013 0914   CALCIUM 9.5 10/21/2013 0914   PROT 7.5 10/21/2013 0914   ALBUMIN 4.3 10/21/2013 0914   AST 20 10/21/2013 0914   ALT 14 10/21/2013 0914   ALKPHOS 50 10/21/2013 0914   BILITOT 0.50 10/21/2013 0914    RADIOGRAPHIC STUDIES: RIGHT LOWER EXTREMITY VENOUS DUPLEX ULTRASOUND  (10/25/2011) Technique: Gray-scale sonography with graded compression, as well as color Doppler and duplex ultrasound, were performed to evaluate the deep venous system of the lower extremity from the level of the common femoral vein through the popliteal and proximal calf veins. Spectral Doppler was utilized to evaluate flow at rest and with distal augmentation maneuvers. Comparison: 06/02/2011 Findings: Residual echogenic intraluminal thrombus noted in the superficial femoral and popliteal veins. Partially occlusive thrombus persist at the saphenofemoral junction extending into the great saphenous vein. Slight interval decrease in thrombus burden throughout the right lower extremity deep veins and the superficial saphenous vein. No occlusive DVT demonstrated on today's study. IMPRESSION:  Residual partially occlusive right femoral popliteal DVT. Some interval  improvement. Residual superficial thrombosis of the great saphenous vein, little interval change. Original Report Authenticated By: Jerilynn Mages. Daryll Brod, M.D.   ASSESSMENT: 1) Tobacco abuse (longstanding); 2) Polycythemia; 3) Right lower extremity DVT  PLAN:  1. Polycythemia.  --We discussed with patient that his polycythemia can be secondary to smoking and again recommended to stop smoking. The patient agrees to continue reduce the amount smoking. He declined tobacco cessation referral presently.   JAK testing is negative and his EPO level is normal.  -- He has normal creatinine.  We discussed the benefit of starting therapy would be to maintain his hct less than 45% and reduce his risk of further clots including CVA, MI and DVT.  His risk factors include his age.  Common side effects include myelosuppression, anorexia, n/v/d, constipation rash, drowsiness, ALT, AST elevated and renal impairment.  He understood the indications, benefits and risks and agreed to proceed.   --Given his today's WBC and hemoglobin, we recommended to continue hydrea at 500 mg daily.  We will repeat labs in one month.    2. RLE DVT. --He will continue coumadin for treatment for his DVT and phlebotomies prn HCT greater than or equal to 45% q monthly.    3. Tobacco abuse.  --Likely contributing to #1.   He will continue to reduce the amount of smoking.   4. Follow-up. --He will return monthly for CBC and a return office visit in one month.  We discussed screening exams including colonoscopy.  The patient has not had  a colonoscopy and declines to have one.  He understood the risks including presenting with colon cancer at an advanced stage.   All questions were answered. The patient knows to call the clinic with any problems, questions or concerns. We can certainly see the patient much sooner if necessary.  I spent 10 minutes counseling the patient face to face. The total time spent in the appointment was 15 minutes.     Zailee Vallely, MD 10/21/2013 12:29 PM

## 2013-10-21 NOTE — Patient Instructions (Signed)
Varicose Veins Varicose veins are veins that have become enlarged and twisted. CAUSES This condition is the result of valves in the veins not working properly. Valves in the veins help return blood from the leg to the heart. If these valves are damaged, blood flows backwards and backs up into the veins in the leg near the skin. This causes the veins to become larger. People who are on their feet a lot, who are pregnant, or who are overweight are more likely to develop varicose veins. SYMPTOMS   Bulging, twisted-appearing, bluish veins, most commonly found on the legs.  Leg pain or a feeling of heaviness. These symptoms may be worse at the end of the day.  Leg swelling.  Skin color changes. DIAGNOSIS  Varicose veins can usually be diagnosed with an exam of your legs by your caregiver. He or she may recommend an ultrasound of your leg veins. TREATMENT  Most varicose veins can be treated at home.However, other treatments are available for people who have persistent symptoms or who want to treat the cosmetic appearance of the varicose veins. These include:  Laser treatment of very small varicose veins.  Medicine that is shot (injected) into the vein. This medicine hardens the walls of the vein and closes off the vein. This treatment is called sclerotherapy. Afterwards, you may need to wear clothing or bandages that apply pressure.  Surgery. HOME CARE INSTRUCTIONS   Do not stand or sit in one position for long periods of time. Do not sit with your legs crossed. Rest with your legs raised during the day.  Wear elastic stockings or support hose. Do not wear other tight, encircling garments around the legs, pelvis, or waist.  Walk as much as possible to increase blood flow.  Raise the foot of your bed at night with 2-inch blocks.  If you get a cut in the skin over the vein and the vein bleeds, lie down with your leg raised and press on it with a clean cloth until the bleeding stops. Then  place a bandage (dressing) on the cut. See your caregiver if it continues to bleed or needs stitches. SEEK MEDICAL CARE IF:   The skin around your ankle starts to break down.  You have pain, redness, tenderness, or hard swelling developing in your leg over a vein.  You are uncomfortable due to leg pain. Document Released: 12/01/2004 Document Revised: 05/16/2011 Document Reviewed: 04/19/2010 [facility] Patient Information 2015 [place], [place]. This information is not intended to replace advice given to you by your health care provider. Make sure you discuss any questions you have with your health care provider.

## 2013-11-19 ENCOUNTER — Ambulatory Visit: Payer: Medicare Other

## 2013-11-19 ENCOUNTER — Other Ambulatory Visit: Payer: Medicare Other

## 2013-12-25 ENCOUNTER — Other Ambulatory Visit: Payer: Self-pay | Admitting: Internal Medicine

## 2013-12-26 ENCOUNTER — Other Ambulatory Visit: Payer: Self-pay | Admitting: *Deleted

## 2013-12-26 ENCOUNTER — Telehealth: Payer: Self-pay | Admitting: Hematology

## 2013-12-26 DIAGNOSIS — I82409 Acute embolism and thrombosis of unspecified deep veins of unspecified lower extremity: Secondary | ICD-10-CM

## 2013-12-26 DIAGNOSIS — D751 Secondary polycythemia: Secondary | ICD-10-CM

## 2013-12-26 NOTE — Telephone Encounter (Signed)
labs/ov per 10/22 POF, pt is aware......Marland Kitchen KJ

## 2013-12-31 ENCOUNTER — Encounter: Payer: Self-pay | Admitting: Hematology

## 2013-12-31 ENCOUNTER — Telehealth: Payer: Self-pay | Admitting: Hematology

## 2013-12-31 ENCOUNTER — Other Ambulatory Visit (HOSPITAL_BASED_OUTPATIENT_CLINIC_OR_DEPARTMENT_OTHER): Payer: Medicare Other

## 2013-12-31 ENCOUNTER — Ambulatory Visit (HOSPITAL_BASED_OUTPATIENT_CLINIC_OR_DEPARTMENT_OTHER): Payer: Medicare Other | Admitting: Hematology

## 2013-12-31 VITALS — BP 152/74 | HR 96 | Temp 97.7°F | Resp 18 | Ht 66.0 in | Wt 134.2 lb

## 2013-12-31 DIAGNOSIS — D751 Secondary polycythemia: Secondary | ICD-10-CM

## 2013-12-31 DIAGNOSIS — Z720 Tobacco use: Secondary | ICD-10-CM | POA: Diagnosis not present

## 2013-12-31 DIAGNOSIS — I82409 Acute embolism and thrombosis of unspecified deep veins of unspecified lower extremity: Secondary | ICD-10-CM

## 2013-12-31 DIAGNOSIS — I82401 Acute embolism and thrombosis of unspecified deep veins of right lower extremity: Secondary | ICD-10-CM | POA: Diagnosis not present

## 2013-12-31 LAB — COMPREHENSIVE METABOLIC PANEL (CC13)
ALBUMIN: 4.2 g/dL (ref 3.5–5.0)
ALK PHOS: 53 U/L (ref 40–150)
ALT: 19 U/L (ref 0–55)
AST: 21 U/L (ref 5–34)
Anion Gap: 10 mEq/L (ref 3–11)
BUN: 7.1 mg/dL (ref 7.0–26.0)
CALCIUM: 9.8 mg/dL (ref 8.4–10.4)
CHLORIDE: 109 meq/L (ref 98–109)
CO2: 25 mEq/L (ref 22–29)
Creatinine: 1 mg/dL (ref 0.7–1.3)
Glucose: 189 mg/dl — ABNORMAL HIGH (ref 70–140)
Potassium: 5.1 mEq/L (ref 3.5–5.1)
SODIUM: 143 meq/L (ref 136–145)
TOTAL PROTEIN: 7.5 g/dL (ref 6.4–8.3)
Total Bilirubin: 0.48 mg/dL (ref 0.20–1.20)

## 2013-12-31 LAB — CBC WITH DIFFERENTIAL/PLATELET
BASO%: 0.7 % (ref 0.0–2.0)
BASOS ABS: 0 10*3/uL (ref 0.0–0.1)
EOS%: 4.5 % (ref 0.0–7.0)
Eosinophils Absolute: 0.3 10*3/uL (ref 0.0–0.5)
HCT: 44.8 % (ref 38.4–49.9)
HGB: 15.5 g/dL (ref 13.0–17.1)
LYMPH#: 2.1 10*3/uL (ref 0.9–3.3)
LYMPH%: 37.4 % (ref 14.0–49.0)
MCH: 44 pg — AB (ref 27.2–33.4)
MCHC: 34.6 g/dL (ref 32.0–36.0)
MCV: 127.3 fL — AB (ref 79.3–98.0)
MONO#: 0.5 10*3/uL (ref 0.1–0.9)
MONO%: 8.7 % (ref 0.0–14.0)
NEUT#: 2.7 10*3/uL (ref 1.5–6.5)
NEUT%: 48.7 % (ref 39.0–75.0)
Platelets: 148 10*3/uL (ref 140–400)
RBC: 3.52 10*6/uL — AB (ref 4.20–5.82)
RDW: 13.5 % (ref 11.0–14.6)
WBC: 5.5 10*3/uL (ref 4.0–10.3)

## 2013-12-31 NOTE — Progress Notes (Signed)
[facility] OFF[facility] PROGRESS NOTE DATE OF SERVICE: 12/31/2013  Isaiah Pac, MD [place] [address]  DIAGNOSIS: Polycythemia, secondary - Plan: CBC with Differential, Comprehensive metabolic panel (Cmet) - [facility]  Chief Complaint  Patient presents with  . Follow-up    CURRENT THERAPY:  Phlebotomy to maintain a hemocrit less than 45%. Hydrea 500 mg daily started on 03/25/2013 but increased to 1,000 mg daily about 6 weeks ago. Based on decreasing hemoglobin recommended 500 mg alternating with 1,000 on odd even days on 08/14/2013. He is now back on hydrea 500 mg daily.   INTERVAL HISTORY:  Isaiah York 68 y.o. male  who was initially sent to Dr. Erskine Speed on 10/04/2012 for evaluation of polycythemia. Today he is accompanied by his daughter.   He was last seen by Dr Juliann Mule on 10/21/2013 and first encounter with me. On 09/14/2012 his hemoglobin was 20.5, hematocrit was 56.8. The patient is smoker. He smokes greater than 1/2 PPD for 54 years. He has good appetite. His weight is stable.  He reported history headaches but when he started to control blood pressure it is gone. The patient denied fever, chills, night sweats, change in appetite or weight.He denied headaches since his blood pressure under control, double vision, blurry vision, nasal congestion, nasal discharge, hearing problems, odynophagia or dysphagia. No chest pain, palpitations, dyspnea, cough, abdominal pain, nausea, vomiting, diarrhea, constipation, hematochezia. The patient denied dysuria, nocturia, polyuria, hematuria, myalgia, numbness, tingling, psychiatric problems. He denies pruritus or headaches following his showers.    Today, he is accompanied by his daughter.  He is taking one tablet by mouth daily.  He reports feeling fine. He is smoking one half a pack a day.  He denies any hospitalizations or emergency room visits. His last phlebotomy was 04/22/2013.   The patient does  not want of doing a therapeutic phlebotomy. He will continue the Hydrea at the current dose and tolerating it well. Based on his strength of the hematocrit over the last 6 months, I will order his labs to be checked every other month. Patient was offered a flu shot but he denied it saying that I have never received a flu shot in my life.  MEDICAL HISTORY: Past Medical History  Diagnosis Date  . HTN (hypertension)     INTERIM HISTORY: has Polycythemia, secondary; Tobacco abuse; and DVT, lower extremity on his problem list.    ALLERGIES:  is allergic to tape.  MEDICATIONS: has a current medication list which includes the following prescription(s): amlodipine, folic acid, hydroxyurea, losartan, and warfarin.  SURGICAL HISTORY: History reviewed. No pertinent past surgical history.  REVIEW OF SYSTEMS:   Constitutional: Denies fevers, chills or abnormal weight loss Eyes: Denies blurriness of vision Ears, nose, mouth, throat, and face: Denies mucositis or sore throat Respiratory: Denies cough, dyspnea or wheezes Cardiovascular: Denies palpitation, chest discomfort or lower extremity swelling Gastrointestinal:  Denies nausea, heartburn or change in bowel habits Skin: Denies abnormal skin rashes Lymphatics: Denies new lymphadenopathy or easy bruising Neurological:Denies numbness, tingling or new weaknesses Behavioral/Psych: Mood is stable, no new changes  All other systems were reviewed with the patient and are negative.  PHYSICAL EXAMINATION: ECOG PERFORMANCE STATUS: 0-1  Blood pressure 152/74, pulse 96, temperature 97.7 F (36.5 C), temperature source Oral, resp. rate 18, height 5\' 6"  (1.676 m), weight 134 lb 3.2 oz (60.873 kg), SpO2 100.00%.  GENERAL:alert, no distress and comfortable elderly who appears his stated age; thin SKIN: skin color, texture, turgor  are normal, no rashes or significant lesions EYES: normal, Conjunctiva are pink and non-injected, sclera clear OROPHARYNX:no  exudate, no erythema and lips, buccal mucosa, and tongue normal  NECK: supple, thyroid normal size, non-tender, without nodularity LYMPH:  no palpable lymphadenopathy in the cervical, axillary or inguinal LUNGS: clear to auscultation and percussion with normal breathing effort HEART: regular rate & rhythm and no murmurs and no lower extremity edema ABDOMEN:abdomen soft, non-tender and normal bowel sounds Musculoskeletal:no cyanosis of digits and no clubbing  NEURO: alert & oriented x 3 with fluent speech, no focal motor/sensory deficits   LABORATORY DATA: CBC    Component Value Date/Time   WBC 5.5 12/31/2013 1106   RBC 3.52* 12/31/2013 1106   HGB 15.5 12/31/2013 1106   HCT 44.8 12/31/2013 1106   PLT 148 12/31/2013 1106   MCV 127.3* 12/31/2013 1106   MCH 44.0* 12/31/2013 1106   MCHC 34.6 12/31/2013 1106   RDW 13.5 12/31/2013 1106   LYMPHSABS 2.1 12/31/2013 1106   MONOABS 0.5 12/31/2013 1106   EOSABS 0.3 12/31/2013 1106   BASOSABS 0.0 12/31/2013 1106   CMP     Component Value Date/Time   NA 143 12/31/2013 1106   K 5.1 12/31/2013 1106   CO2 25 12/31/2013 1106   GLUCOSE 189* 12/31/2013 1106   BUN 7.1 12/31/2013 1106   CREATININE 1.0 12/31/2013 1106   CALCIUM 9.8 12/31/2013 1106   PROT 7.5 12/31/2013 1106   ALBUMIN 4.2 12/31/2013 1106   AST 21 12/31/2013 1106   ALT 19 12/31/2013 1106   ALKPHOS 53 12/31/2013 1106   BILITOT 0.48 12/31/2013 1106   HEMATOCRIT TREND AND GRAPH BELOW:         RADIOGRAPHIC STUDIES: RIGHT LOWER EXTREMITY VENOUS DUPLEX ULTRASOUND  (10/25/2011) Technique: Gray-scale sonography with graded compression, as well as color Doppler and duplex ultrasound, were performed to evaluate the deep venous system of the lower extremity from the level of the common femoral vein through the popliteal and proximal calf veins. Spectral Doppler was utilized to evaluate flow at rest and with distal augmentation maneuvers. Comparison: 06/02/2011 Findings: Residual  echogenic intraluminal thrombus noted in the superficial femoral and popliteal veins. Partially occlusive thrombus persist at the saphenofemoral junction extending into the great saphenous vein. Slight interval decrease in thrombus burden throughout the right lower extremity deep veins and the superficial saphenous vein. No occlusive DVT demonstrated on today's study. IMPRESSION:  Residual partially occlusive right femoral popliteal DVT. Some interval improvement. Residual superficial thrombosis of the great saphenous vein, little interval change. Original Report Authenticated By: Jerilynn Mages. Daryll Brod, M.D.   ASSESSMENT: 1) Tobacco abuse (longstanding); 2) Polycythemia; 3) Right lower extremity DVT  PLAN:  1. Polycythemia.  --We discussed with patient that his polycythemia can be secondary to smoking and again recommended to stop smoking. The patient agrees to continue reduce the amount smoking. He declined tobacco cessation referral presently.   JAK testing is negative and his EPO level is normal.  -- He has normal creatinine.  We discussed the benefit of starting therapy would be to maintain his hct less than 45% and reduce his risk of further clots including CVA, MI and DVT.  His risk factors include his age.  Common side effects include myelosuppression, anorexia, n/v/d, constipation rash, drowsiness, ALT, AST elevated and renal impairment.  He understood the indications, benefits and risks and agreed to proceed.   --Given his today's WBC and hemoglobin, we recommended to continue hydrea at 500 mg daily.  We will repeat labs  in two months.    2. RLE DVT. --He will continue coumadin for treatment for his DVT and phlebotomies prn HCT greater than or equal to 45% q monthly (patient states he would rather not have a TP and willing to take Hydrea for count control).    3. Tobacco abuse.  --Likely contributing to #1.   He will continue to reduce the amount of smoking.   4. Follow-up. --He will return in  2 months.  We discussed screening exams including colonoscopy.  The patient has not had a colonoscopy and declines to have one.  He understood the risks including presenting with colon cancer at an advanced stage.   All questions were answered. The patient knows to call the clinic with any problems, questions or concerns. We can certainly see the patient much sooner if necessary.  I spent 10 minutes counseling the patient face to face. The total time spent in the appointment was 15 minutes.  Bernadene Bell, MD Medical Hematologist/Oncologist [facility] Pager: [phone] Office No: [phone]

## 2013-12-31 NOTE — Telephone Encounter (Signed)
Gave avs & cal for Dec 2016. °

## 2014-01-10 ENCOUNTER — Other Ambulatory Visit: Payer: Self-pay | Admitting: *Deleted

## 2014-01-10 DIAGNOSIS — D751 Secondary polycythemia: Secondary | ICD-10-CM

## 2014-01-10 MED ORDER — HYDROXYUREA 500 MG PO CAPS: 500.0000 mg | ORAL_CAPSULE | Freq: Every day | ORAL | Status: DC

## 2014-01-14 ENCOUNTER — Other Ambulatory Visit: Payer: Self-pay | Admitting: *Deleted

## 2014-01-14 NOTE — Telephone Encounter (Signed)
Refill request to MD for approval.

## 2014-02-07 IMAGING — US US EXTREM LOW VENOUS*R*
1 series · 14 of 24 positions shown · non-contrast
Comparison: None.

CLINICAL DATA: Right calf pain.  Smoker.

RIGHT LOWER EXTREMITY VENOUS DUPLEX ULTRASOUND
TECHNIQUE: Gray-scale sonography with graded compression, as well
as color Doppler and duplex ultrasound, were performed to evaluate
the deep venous system of the lower extremity from the level of the
common femoral vein through the popliteal and proximal calf veins.
Spectral Doppler was utilized to evaluate flow at rest and with
distal augmentation maneuvers.

[Series 1: us extrem low venous*right* · 14 of 31 slices shown]
[im 1/31]
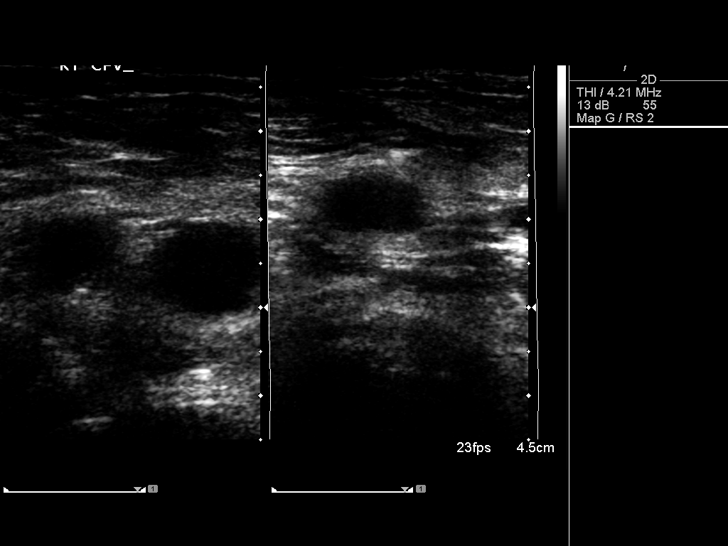
[im 3/31]
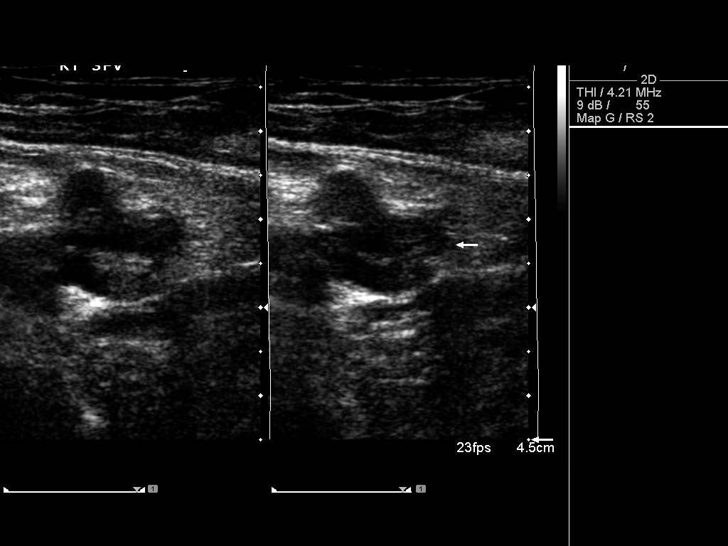
[im 6/31]
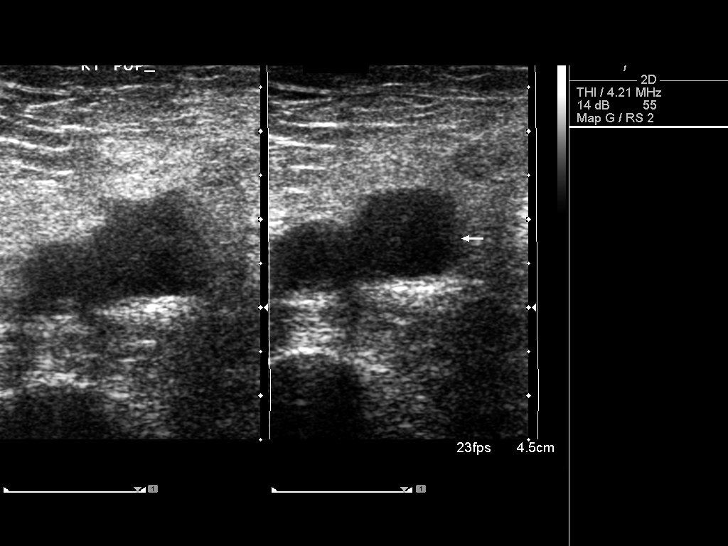
[im 8/31]
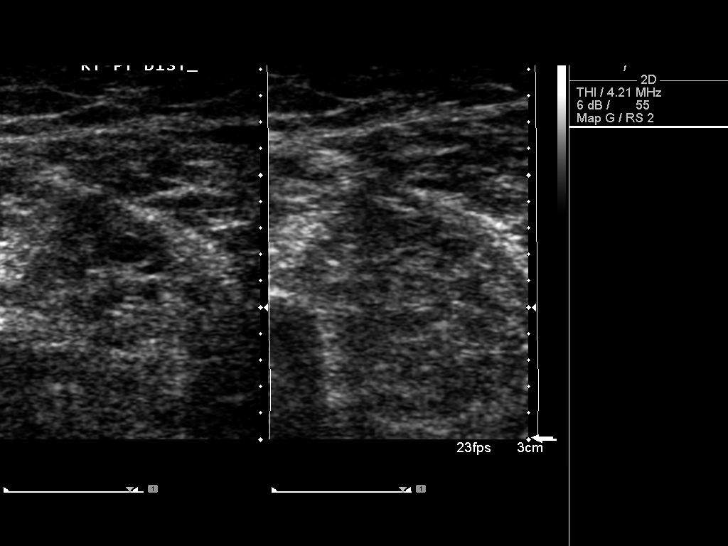
[im 10/31]
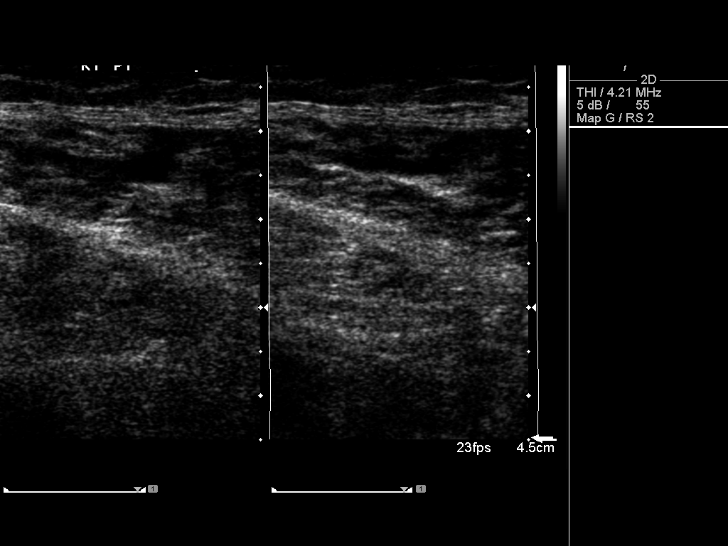
[im 12/31]
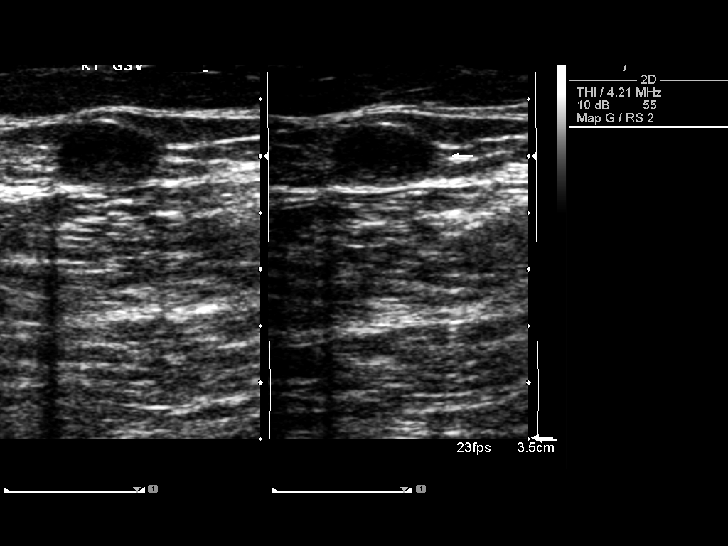
[im 15/31]
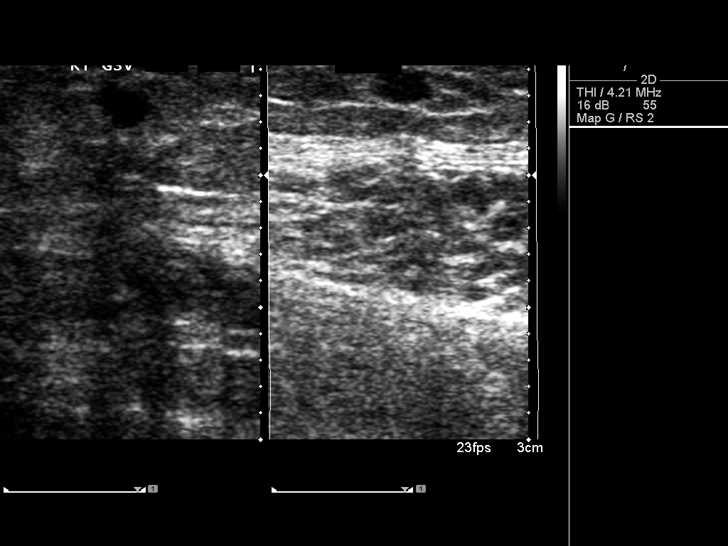
[im 16/31]
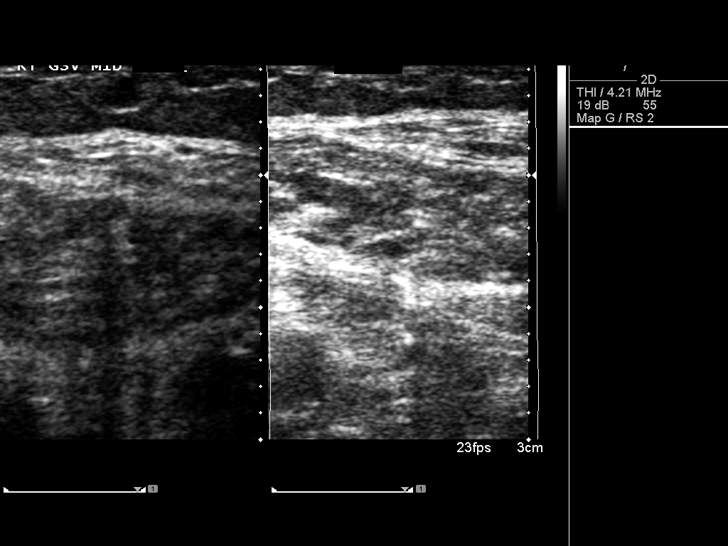
[im 19/31]
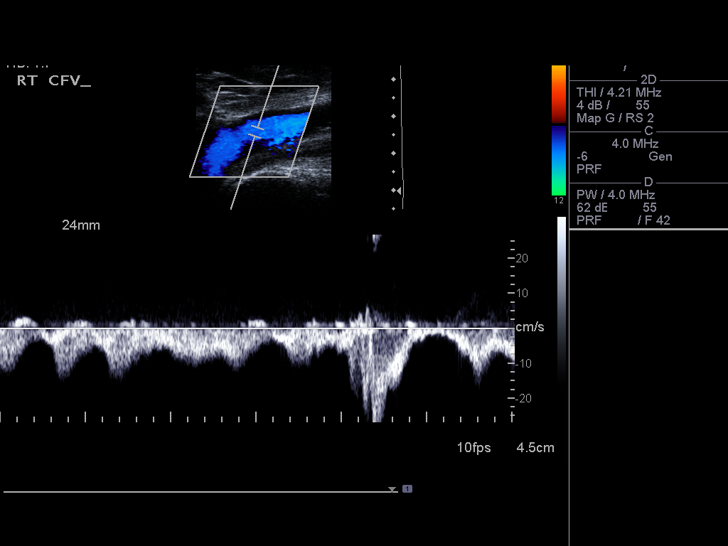
[im 21/31]
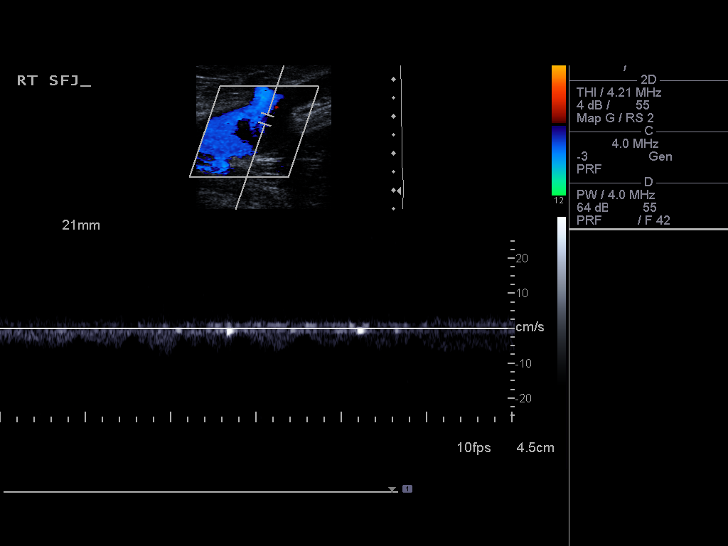
[im 24/31]
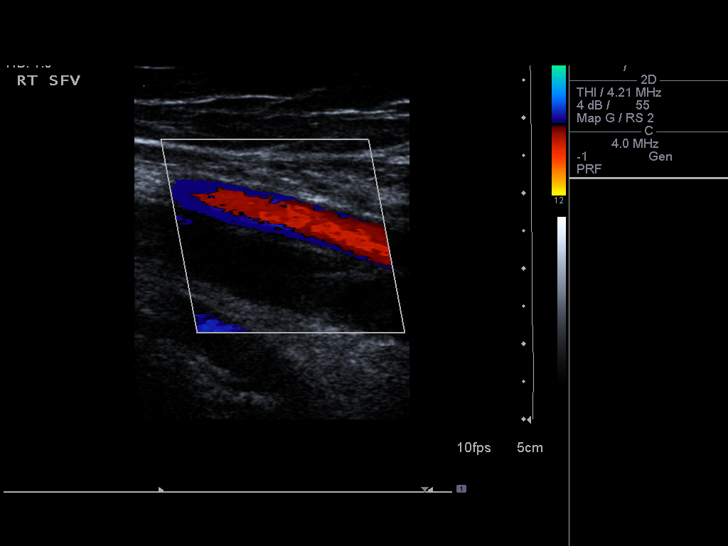
[im 25/31]
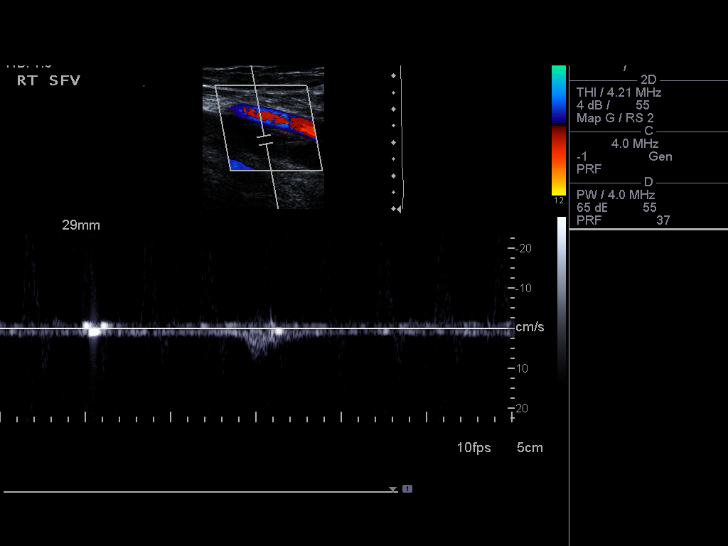
[im 28/31]
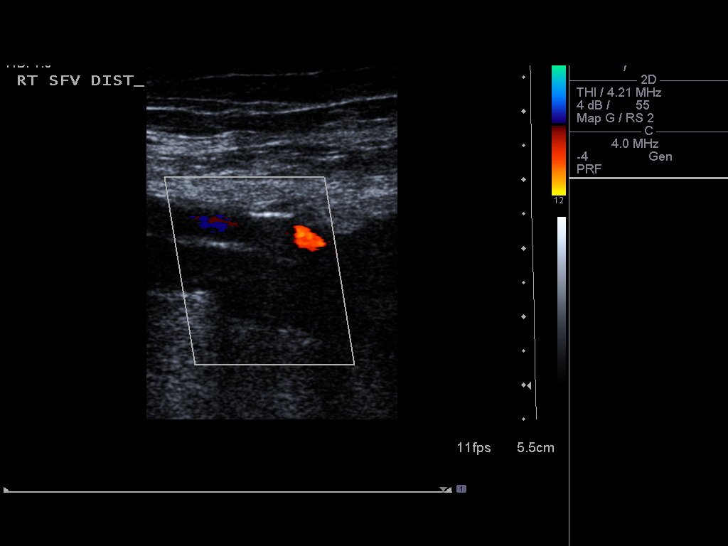
[im 31/31]
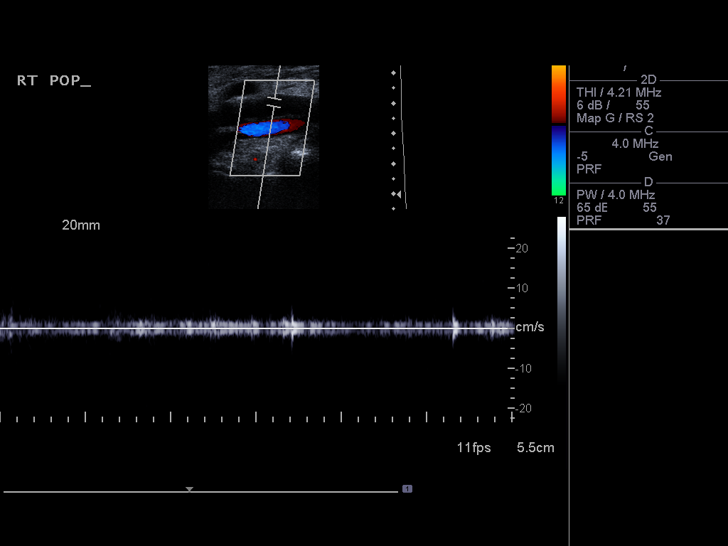

[14 of 24 positions shown; findings below may reference images not displayed]

FINDINGS: Thrombus in the greater saphenous vein, protruding into
the lumen at the junction of the right femoral and common femoral
veins.  The right femoral vein is completely occluded with near
total occlusion of the popliteal vein.  The right common femoral
vein is patent.
IMPRESSION: Extensive right deep venous thrombosis and greater saphenous vein
thrombosis, as described above.

This report will be called to the referring physician or their
representative.

## 2014-02-10 ENCOUNTER — Telehealth: Payer: Self-pay | Admitting: Hematology

## 2014-02-10 NOTE — Telephone Encounter (Signed)
moved from [facility] 12/15 to [facility] 12/29. [email or web address] for pt and mailed schedule.

## 2014-02-18 ENCOUNTER — Other Ambulatory Visit: Payer: Medicare Other

## 2014-02-18 ENCOUNTER — Ambulatory Visit: Payer: Medicare Other

## 2014-03-04 ENCOUNTER — Ambulatory Visit (HOSPITAL_BASED_OUTPATIENT_CLINIC_OR_DEPARTMENT_OTHER): Payer: Medicare Other | Admitting: Hematology

## 2014-03-04 ENCOUNTER — Telehealth: Payer: Self-pay | Admitting: Hematology

## 2014-03-04 ENCOUNTER — Encounter: Payer: Self-pay | Admitting: Hematology

## 2014-03-04 ENCOUNTER — Other Ambulatory Visit (HOSPITAL_BASED_OUTPATIENT_CLINIC_OR_DEPARTMENT_OTHER): Payer: Medicare Other

## 2014-03-04 VITALS — BP 149/73 | HR 90 | Temp 98.1°F | Resp 18 | Ht 66.0 in | Wt 133.7 lb

## 2014-03-04 DIAGNOSIS — D751 Secondary polycythemia: Secondary | ICD-10-CM

## 2014-03-04 DIAGNOSIS — Z720 Tobacco use: Secondary | ICD-10-CM

## 2014-03-04 LAB — CBC WITH DIFFERENTIAL/PLATELET
BASO%: 1.1 % (ref 0.0–2.0)
BASOS ABS: 0.1 10*3/uL (ref 0.0–0.1)
EOS ABS: 0.2 10*3/uL (ref 0.0–0.5)
EOS%: 5.2 % (ref 0.0–7.0)
HEMATOCRIT: 43.7 % (ref 38.4–49.9)
HEMOGLOBIN: 14.6 g/dL (ref 13.0–17.1)
LYMPH%: 39.5 % (ref 14.0–49.0)
MCH: 44.5 pg — AB (ref 27.2–33.4)
MCHC: 33.3 g/dL (ref 32.0–36.0)
MCV: 133.7 fL — AB (ref 79.3–98.0)
MONO#: 0.3 10*3/uL (ref 0.1–0.9)
MONO%: 7.2 % (ref 0.0–14.0)
NEUT#: 2.3 10*3/uL (ref 1.5–6.5)
NEUT%: 47 % (ref 39.0–75.0)
Platelets: 147 10*3/uL (ref 140–400)
RBC: 3.27 10*6/uL — ABNORMAL LOW (ref 4.20–5.82)
RDW: 15.7 % — AB (ref 11.0–14.6)
WBC: 4.8 10*3/uL (ref 4.0–10.3)
lymph#: 1.9 10*3/uL (ref 0.9–3.3)

## 2014-03-04 LAB — COMPREHENSIVE METABOLIC PANEL (CC13)
ALBUMIN: 4.3 g/dL (ref 3.5–5.0)
ALT: 19 U/L (ref 0–55)
ANION GAP: 10 meq/L (ref 3–11)
AST: 25 U/L (ref 5–34)
Alkaline Phosphatase: 58 U/L (ref 40–150)
BUN: 9.9 mg/dL (ref 7.0–26.0)
CALCIUM: 9.2 mg/dL (ref 8.4–10.4)
CO2: 25 mEq/L (ref 22–29)
CREATININE: 0.9 mg/dL (ref 0.7–1.3)
Chloride: 107 mEq/L (ref 98–109)
EGFR: 89 mL/min/{1.73_m2} — ABNORMAL LOW (ref >90)
GLUCOSE: 168 mg/dL — AB (ref 70–140)
POTASSIUM: 3.9 meq/L (ref 3.5–5.1)
Sodium: 142 mEq/L (ref 136–145)
Total Bilirubin: 0.79 mg/dL (ref 0.20–1.20)
Total Protein: 7.4 g/dL (ref 6.4–8.3)

## 2014-03-04 NOTE — Progress Notes (Signed)
[facility]EMATOLOGY OFFICE PROGRESS NOTE DATE OF SERVICE: 12/31/2013  [name], MD [place] [address]  DIAGNOSIS: Polycythemia, secondary - Plan: CBC with Differential, Comprehensive metabolic panel (Cmet) - [place]  Chief Complaint  Patient presents with  . Follow-up    CURRENT THERAPY:  Phlebotomy to maintain a hemocrit less than 45%. Hydrea 500 mg daily started on 03/25/2013 and increased to 1,000 mg daily later, and decreased back to 558m daily due to cytopenia.    INTERVAL HISTORY:  Isaiah REINIG89y.o. male  who was initially sent to Dr. RErskine Speedon 10/04/2012 for evaluation of polycythemia. Today he is accompanied by his daughter.   He was previously seen by Dr CJuliann Muleand Dr. SArlys John and first time to see me.    He is doing well overall, no complains. No pain, dyspnea, nausea or other complains. He has good energy level and eats well. His weight is stable. He has cut back of his smoking, down to half back a day now.    MEDICAL HISTORY: Past Medical History  Diagnosis Date  . HTN (hypertension)     INTERIM HISTORY: has Polycythemia, secondary; Tobacco abuse; and DVT, lower extremity on his problem list.    ALLERGIES:  is allergic to tape.  MEDICATIONS: has a current medication list which includes the following prescription(s): amlodipine, folic acid, hydroxyurea, losartan, and warfarin.  SURGICAL HISTORY: History reviewed. No pertinent past surgical history.  REVIEW OF SYSTEMS:   Constitutional: Denies fevers, chills or abnormal weight loss Eyes: Denies blurriness of vision Ears, nose, mouth, throat, and face: Denies mucositis or sore throat Respiratory: Denies cough, dyspnea or wheezes Cardiovascular: Denies palpitation, chest discomfort or lower extremity swelling Gastrointestinal:  Denies nausea, heartburn or change in bowel habits Skin: Denies abnormal skin rashes Lymphatics: Denies new lymphadenopathy or easy  bruising Neurological:Denies numbness, tingling or new weaknesses Behavioral/Psych: Mood is stable, no new changes  All other systems were reviewed with the patient and are negative.  PHYSICAL EXAMINATION: ECOG PERFORMANCE STATUS: 0-1  Blood pressure 149/73, pulse 90, temperature 98.1 F (36.7 C), temperature source Oral, resp. rate 18, height _0  (1.676 m), weight 133 lb 11.2 oz (60.646 kg), SpO2 100 %.  GENERAL:alert, no distress and comfortable elderly who appears his stated age; thin SKIN: skin color, texture, turgor are normal, no rashes or significant lesions EYES: normal, Conjunctiva are pink and non-injected, sclera clear OROPHARYNX:no exudate, no erythema and lips, buccal mucosa, and tongue normal  NECK: supple, thyroid normal size, non-tender, without nodularity LYMPH:  no palpable lymphadenopathy in the cervical, axillary or inguinal LUNGS: clear to auscultation and percussion with normal breathing effort HEART: regular rate & rhythm and no murmurs and no lower extremity edema ABDOMEN:abdomen soft, non-tender and normal bowel sounds Musculoskeletal:no cyanosis of digits and no clubbing  NEURO: alert & oriented x 3 with fluent speech, no focal motor/sensory deficits   LABORATORY DATA: CBC Latest Ref Rng 03/04/2014 12/31/2013 10/21/2013  WBC 4.0 - 10.3 10e3/uL 4.8 5.5 4.5  Hemoglobin 13.0 - 17.1 g/dL 14.6 15.5 13.4  Hematocrit 38.4 - 49.9 % 43.7 44.8 39.7  Platelets 140 - 400 10e3/uL 147 148 183   CMP Latest Ref Rng 03/04/2014 12/31/2013 10/21/2013  Glucose 70 - 140 mg/dl 168(H) 189(H) 135  BUN 7.0 - 26.0 mg/dL 9.9 7.1 11.6  Creatinine 0.7 - 1.3 mg/dL 0.9 1.0 1.0  Sodium 136 - 145 mEq/L 142 143 142  Potassium 3.5 - 5.1 mEq/L 3.9 5.1 4.2  CO2 22 - 29 mEq/L _0 Calcium 8.4 - 10.4 mg/dL 9.2 9.8 9.5  Total Protein 6.4 - 8.3 g/dL 7.4 7.5 7.5  Total Bilirubin 0.20 - 1.20 mg/dL 0.79 0.48 0.50  Alkaline Phos 40 - 150 U/L 58 53 50  AST 5 - 34 U/L _1 ALT 0 - 55  U/L _2 oietin  Status: Finalresult Visible to patient:  Not Released Nextappt: 04/29/2014 at 08:00 AM in [facility] 1) Dx:  Polycythemia         Ref Range 67yrago    Erythropoietin 2.6 - 18.5 mIU/mL 3.1         RADIOGRAPHIC STUDIES: RIGHT LOWER EXTREMITY VENOUS DUPLEX ULTRASOUND  (10/25/2011) Technique: Gray-scale sonography with graded compression, as well as color Doppler and duplex ultrasound, were performed to evaluate the deep venous system of the lower extremity from the level of the common femoral vein through the popliteal and proximal calf veins. Spectral Doppler was utilized to evaluate flow at rest and with distal augmentation maneuvers. Comparison: 06/02/2011 Findings: Residual echogenic intraluminal thrombus noted in the superficial femoral and popliteal veins. Partially occlusive thrombus persist at the saphenofemoral junction extending into the great saphenous vein. Slight interval decrease in thrombus burden throughout the right lower extremity deep veins and the superficial saphenous vein. No occlusive DVT demonstrated on today's study. IMPRESSION:  Residual partially occlusive right femoral popliteal DVT. Some interval improvement. Residual superficial thrombosis of the great saphenous vein, little interval change. Original Report Authenticated By: MJerilynn Mages TDaryll Brod M.D.   ASSESSMENT: 1) Tobacco abuse (longstanding); 2) Polycythemia; 3) Right lower extremity DVT  PLAN:  1. 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 JAK2 mutation (-), erythropoietin normal at 3.1  --We discussed with patient that his polycythemia can be secondary to smoking and again recommended to stop smoking. The patient agrees to continue reduce the amount smoking. He declined tobacco cessation referral presently.   JAK testing is negative and his EPO level is normal.  -We discussed that other MPN are not excluded. Bone marrow biopsy would be helpful in the  diagnosis. He declined at this point.  --continue hydrea to maintain his hct less than 45% and reduce his risk of further clots including CVA, MI and DVT.  His risk factors include his age.  Common side effects include myelosuppression, anorexia, n/v/d, constipation rash, drowsiness, ALT, AST elevated and renal impairment.  He voiced good understanding.  --Given his today's WBC and hemoglobin, we recommended to continue hydrea at 500 mg daily.  We will repeat labs in two months.    2. RLE DVT. --He will continue coumadin for treatment for his DVT and phlebotomies prn HCT greater than or equal to 45% q monthly (patient states he would rather not have a TP and willing to take Hydrea for count control).    3. Tobacco abuse.  --Likely contributing to #1.   He will continue to reduce the amount of smoking.   4. Follow-up. --He will return in 2 months.  We discussed screening exams including colonoscopy.  The patient has not had a colonoscopy and declines to have one.  He understood the risks including presenting with colon cancer at an advanced stage.   All questions were answered. The patient knows to call the clinic with any problems, questions or concerns. We can certainly see the patient much sooner if necessary.  I spent 10 minutes counseling the patient face to face. The total time spent in  the appointment was 15 minutes.  Isaiah York 03/04/2014

## 2014-03-04 NOTE — Telephone Encounter (Signed)
gv and printed appt sched and avs for pt for Feb 2016 °

## 2014-04-29 ENCOUNTER — Ambulatory Visit (HOSPITAL_BASED_OUTPATIENT_CLINIC_OR_DEPARTMENT_OTHER): Payer: Medicare Other | Admitting: Hematology

## 2014-04-29 ENCOUNTER — Ambulatory Visit (HOSPITAL_BASED_OUTPATIENT_CLINIC_OR_DEPARTMENT_OTHER): Payer: Medicare Other | Admitting: Pharmacist

## 2014-04-29 ENCOUNTER — Other Ambulatory Visit (HOSPITAL_BASED_OUTPATIENT_CLINIC_OR_DEPARTMENT_OTHER): Payer: Medicare Other

## 2014-04-29 ENCOUNTER — Encounter: Payer: Self-pay | Admitting: Hematology

## 2014-04-29 ENCOUNTER — Telehealth: Payer: Self-pay | Admitting: Hematology

## 2014-04-29 ENCOUNTER — Ambulatory Visit (HOSPITAL_BASED_OUTPATIENT_CLINIC_OR_DEPARTMENT_OTHER): Payer: Medicare Other

## 2014-04-29 VITALS — BP 162/79 | HR 87 | Temp 98.4°F | Resp 18 | Ht 66.0 in | Wt 134.4 lb

## 2014-04-29 DIAGNOSIS — Z720 Tobacco use: Secondary | ICD-10-CM

## 2014-04-29 DIAGNOSIS — I82409 Acute embolism and thrombosis of unspecified deep veins of unspecified lower extremity: Secondary | ICD-10-CM

## 2014-04-29 DIAGNOSIS — D751 Secondary polycythemia: Secondary | ICD-10-CM

## 2014-04-29 LAB — COMPREHENSIVE METABOLIC PANEL (CC13)
ALBUMIN: 4.1 g/dL (ref 3.5–5.0)
ALT: 11 U/L (ref 0–55)
AST: 20 U/L (ref 5–34)
Alkaline Phosphatase: 52 U/L (ref 40–150)
Anion Gap: 6 mEq/L (ref 3–11)
BUN: 7 mg/dL (ref 7.0–26.0)
CALCIUM: 9 mg/dL (ref 8.4–10.4)
CHLORIDE: 108 meq/L (ref 98–109)
CO2: 25 mEq/L (ref 22–29)
Creatinine: 1 mg/dL (ref 0.7–1.3)
EGFR: 76 mL/min/{1.73_m2} — AB (ref >90)
GLUCOSE: 138 mg/dL (ref 70–140)
POTASSIUM: 4.3 meq/L (ref 3.5–5.1)
Sodium: 139 mEq/L (ref 136–145)
TOTAL PROTEIN: 6.9 g/dL (ref 6.4–8.3)
Total Bilirubin: 0.6 mg/dL (ref 0.20–1.20)

## 2014-04-29 LAB — CBC WITH DIFFERENTIAL/PLATELET
BASO%: 0.7 % (ref 0.0–2.0)
Basophils Absolute: 0 10*3/uL (ref 0.0–0.1)
EOS%: 5 % (ref 0.0–7.0)
Eosinophils Absolute: 0.2 10*3/uL (ref 0.0–0.5)
HCT: 38.4 % (ref 38.4–49.9)
HGB: 13.4 g/dL (ref 13.0–17.1)
LYMPH#: 1.8 10*3/uL (ref 0.9–3.3)
LYMPH%: 39.5 % (ref 14.0–49.0)
MCH: 46.2 pg — AB (ref 27.2–33.4)
MCHC: 34.9 g/dL (ref 32.0–36.0)
MCV: 132.4 fL — ABNORMAL HIGH (ref 79.3–98.0)
MONO#: 0.3 10*3/uL (ref 0.1–0.9)
MONO%: 7.4 % (ref 0.0–14.0)
NEUT#: 2.2 10*3/uL (ref 1.5–6.5)
NEUT%: 47.4 % (ref 39.0–75.0)
Platelets: 127 10*3/uL — ABNORMAL LOW (ref 140–400)
RBC: 2.9 10*6/uL — ABNORMAL LOW (ref 4.20–5.82)
RDW: 14.3 % (ref 11.0–14.6)
WBC: 4.6 10*3/uL (ref 4.0–10.3)

## 2014-04-29 LAB — PROTIME-INR
INR: 2.3 (ref 2.00–3.50)
PROTIME: 27.6 s — AB (ref 10.6–13.4)

## 2014-04-29 LAB — POCT INR: INR: 2.3

## 2014-04-29 MED ORDER — WARFARIN SODIUM 2 MG PO TABS: 2.0000 mg | ORAL_TABLET | Freq: Every day | ORAL | Status: DC

## 2014-04-29 NOTE — Progress Notes (Signed)
[place] HEMATOLOGY OFFICE PROGRESS NOTE DATE OF SERVICE: 12/31/2013  Isaiah Pac, Isaiah [place] [address]  DIAGNOSIS: DVT of lower extremity (deep venous thrombosis), unspecified laterality - Plan: Protime-INR  Chief Complaint  Patient presents with  . Follow-up    polycythemia    CURRENT THERAPY:  Phlebotomy to maintain a hemocrit less than 45%. Hydrea 500 mg daily started on 03/25/2013 and increased to 1,000 mg daily later, and decreased back to 59m daily due to cytopenia.    INTERVAL HISTORY:  Isaiah GERVIN89y.o. male returns for follow-up of polycythemia. Today he is accompanied by his daughter.  He is doping well overall, he has some bruising on hands and arms,  He denied any bleeding episodes including hematochezia, melana, hemoptysis, hematuria or epitaxis.  He is otherwise doing well. 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 No other complains. He still actively smoking, half pack a day. He used to smoke 1.5 pack daily.     MEDICAL HISTORY: Past Medical History  Diagnosis Date  . HTN (hypertension)     INTERIM HISTORY: has Polycythemia, secondary; Tobacco abuse; DVT, lower extremity; and DVT of lower extremity (deep venous thrombosis), unspecified laterality on his problem list.    ALLERGIES:  is allergic to tape.  MEDICATIONS: has a current medication list which includes the following prescription(s): amlodipine, folic acid, hydroxyurea, losartan, and warfarin.  SURGICAL HISTORY: History reviewed. No pertinent past surgical history.  REVIEW OF SYSTEMS:   Constitutional: Denies fevers, chills or abnormal weight loss Eyes: Denies blurriness of vision Ears, nose, mouth, throat, and face: Denies mucositis or sore throat Respiratory: Denies cough, dyspnea or wheezes Cardiovascular: Denies palpitation, chest discomfort or lower extremity swelling Gastrointestinal:  Denies nausea, heartburn or change in  bowel habits Skin: Denies abnormal skin rashes Lymphatics: Denies new lymphadenopathy or easy bruising Neurological:Denies numbness, tingling or new weaknesses Behavioral/Psych: Mood is stable, no new changes  All other systems were reviewed with the patient and are negative.  PHYSICAL EXAMINATION: ECOG PERFORMANCE STATUS: 0  Blood pressure 162/79, pulse 87, temperature 98.4 F (36.9 C), temperature source Oral, resp. rate 18, height 5' 6"  (1.676 m), weight 134 lb 6.4 oz (60.963 kg), SpO2 100 %.  GENERAL:alert, no distress and comfortable elderly who appears his stated age; thin SKIN: skin color, texture, turgor are normal, no rashes or significant lesions EYES: normal, Conjunctiva are pink and non-injected, sclera clear OROPHARYNX:no exudate, no erythema and lips, buccal mucosa, and tongue normal  NECK: supple, thyroid normal size, non-tender, without nodularity LYMPH:  no palpable lymphadenopathy in the cervical, axillary or inguinal LUNGS: clear to auscultation and percussion with normal breathing effort HEART: regular rate & rhythm and no murmurs and no lower extremity edema ABDOMEN:abdomen soft, non-tender and normal bowel sounds Musculoskeletal:no cyanosis of digits and no clubbing  NEURO: alert & oriented x 3 with fluent speech, no focal motor/sensory deficits   LABORATORY DATA: CBC Latest Ref Rng 04/29/2014 03/04/2014 12/31/2013  WBC 4.0 - 10.3 10e3/uL 4.6 4.8 5.5  Hemoglobin 13.0 - 17.1 g/dL 13.4 14.6 15.5  Hematocrit 38.4 - 49.9 % 38.4 43.7 44.8  Platelets 140 - 400 10e3/uL 127(L) 147 148   CMP Latest Ref Rng 04/29/2014 03/04/2014 12/31/2013  Glucose 70 - 140 mg/dl 138 168(H) 189(H)  BUN 7.0 - 26.0 mg/dL 7.0 9.9 7.1  Creatinine 0.7 - 1.3 mg/dL 1.0 0.9 1.0  Sodium 136 - 145 mEq/L 139 142 143  Potassium 3.5 - 5.1  mEq/L 4.3 3.9 5.1  CO2 22 - 29 mEq/L 25 25 25   Calcium 8.4 - 10.4 mg/dL 9.0 9.2 9.8  Total Protein 6.4 - 8.3 g/dL 6.9 7.4 7.5  Total Bilirubin 0.20 - 1.20  mg/dL 0.60 0.79 0.48  Alkaline Phos 40 - 150 U/L 52 58 53  AST 5 - 34 U/L 20 25 21   ALT 0 - 55 U/L 11 19 19    oietin  Status: Finalresult Visible to patient:  Not Released Nextappt: 04/29/2014 at 08:00 AM in [facility] 1) Dx:  Polycythemia         Ref Range 62yrago    Erythropoietin 2.6 - 18.5 mIU/mL 3.1         RADIOGRAPHIC STUDIES: RIGHT LOWER EXTREMITY VENOUS DUPLEX ULTRASOUND  (10/25/2011) Technique: Gray-scale sonography with graded compression, as well as color Doppler and duplex ultrasound, were performed to evaluate the deep venous system of the lower extremity from the level of the common femoral vein through the popliteal and proximal calf veins. Spectral Doppler was utilized to evaluate flow at rest and with distal augmentation maneuvers. Comparison: 06/02/2011 Findings: Residual echogenic intraluminal thrombus noted in the superficial femoral and popliteal veins. Partially occlusive thrombus persist at the saphenofemoral junction extending into the great saphenous vein. Slight interval decrease in thrombus burden throughout the right lower extremity deep veins and the superficial saphenous vein. No occlusive DVT demonstrated on today's study. IMPRESSION:  Residual partially occlusive right femoral popliteal DVT. Some interval improvement. Residual superficial thrombosis of the great saphenous vein, little interval change. Original Report Authenticated By: MJerilynn Mages TDaryll Brod M.D.   ASSESSMENT: 1) Tobacco abuse (longstanding); 2) Polycythemia; 3) Right lower extremity DVT  PLAN:  1. 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 JAK2 mutation (-), erythropoietin normal at 3.1  --We discussed with patient that his polycythemia can be secondary to smoking and again recommended to stop smoking. The patient agrees to continue reduce the amount smoking. He declined tobacco cessation referral presently.   JAK testing is negative and his EPO level is  normal.  -We discussed that other MPN are not excluded. Bone marrow biopsy would be helpful in the diagnosis. He declined at this point.  --continue hydrea to maintain his hct less than 45% and reduce his risk of further clots including CVA, MI and DVT.  His risk factors include his age.  Common side effects include myelosuppression, anorexia, n/v/d, constipation rash, drowsiness, ALT, AST elevated, renal impairment and secondary MDS/leukemia.  He voiced good understanding.  --His hemoglobin is 13.4 today, no need for phlebotomy. however he has mild thrombocytopenia now, which is new. I recommend him to reduce Hydrea to 500 mg daily but hold on Wednesday and Saturday.  2. Recurrent RLE DVT. --He will continue coumadin for treatment for his DVT and phlebotomies prn HCT greater than or equal to 45% q monthly (patient states he would rather not have a TP and willing to take Hydrea for count control).   -He had multiple recurrent DVT and symptomatic phlebotomies, I recommend anticoagulation indefinitely. -He use to follow-up with his primary care physician for PT/INR, but he would like to switch to our Coumadin clinic for his convenience. I referred him to clinic clinic today.  3. Tobacco abuse.  --Likely contributing to #1.   He is not willing to quit but will continue to reduce the amount of smoking.    Plan -CBC monthly -decrese hydrea to 5021mdaily except Wednesday and Saturday  -CBC monthly and RTC in 3 month -  follow up coumadin clinic here, will call you later today and refill your coumadin   All questions were answered. The patient knows to call the clinic with any problems, questions or concerns. We can certainly see the patient much sooner if necessary.  I spent 20 minutes counseling the patient face to face. The total time spent in the appointment was 25 minutes.  Truitt Merle 04/29/2014

## 2014-04-29 NOTE — Progress Notes (Signed)
New consult by Dr. Burr Medico to [facility] Coumadin clinic. Indication: Lower extremity DVT (chronic anticoag). Previously managed by Dr. [name] [facility] [employer] [facility].- I have contacted their office (ph# [phone]) and informed them we are managing pts anticoag for now while frequent lab checks for polycythemia vera. Duration: indefinite Goal INR = 2-3 Current dose: Coumadin 2 mg daily. Today's INR = 2.3 Pt & his dtr were told they could leave after going for INR in lab so I did not see pt in Coumadin clinic.  I called his mobile number & s/w his dtr & gave her results.  They understand plan for enrolling in our Coumadin clinic and will return in 1 month w/ routine labs on 05/27/14.  They know to stay after lab for visit w/ pharmacist that day. No change was made to pts Coumadin dose today.  I e-Rx'd refill to [facility] on [address] for more Coumadin 2 mg tabs. Kennith Center, Pharm.D., CPP 04/29/2014@10 :42 AM

## 2014-04-29 NOTE — Telephone Encounter (Signed)
Gave avs & calendar for March/April/May.

## 2014-05-27 ENCOUNTER — Other Ambulatory Visit (HOSPITAL_BASED_OUTPATIENT_CLINIC_OR_DEPARTMENT_OTHER): Payer: Medicare Other

## 2014-05-27 ENCOUNTER — Ambulatory Visit (HOSPITAL_BASED_OUTPATIENT_CLINIC_OR_DEPARTMENT_OTHER): Payer: Medicare Other | Admitting: Pharmacist

## 2014-05-27 DIAGNOSIS — I82409 Acute embolism and thrombosis of unspecified deep veins of unspecified lower extremity: Secondary | ICD-10-CM | POA: Diagnosis not present

## 2014-05-27 DIAGNOSIS — D751 Secondary polycythemia: Secondary | ICD-10-CM

## 2014-05-27 LAB — COMPREHENSIVE METABOLIC PANEL (CC13)
ALK PHOS: 55 U/L (ref 40–150)
ALT: 17 U/L (ref 0–55)
ANION GAP: 12 meq/L — AB (ref 3–11)
AST: 24 U/L (ref 5–34)
Albumin: 4.3 g/dL (ref 3.5–5.0)
BILIRUBIN TOTAL: 0.68 mg/dL (ref 0.20–1.20)
BUN: 6.8 mg/dL — ABNORMAL LOW (ref 7.0–26.0)
CO2: 24 mEq/L (ref 22–29)
CREATININE: 1 mg/dL (ref 0.7–1.3)
Calcium: 9.4 mg/dL (ref 8.4–10.4)
Chloride: 105 mEq/L (ref 98–109)
EGFR: 78 mL/min/{1.73_m2} — AB (ref >90)
GLUCOSE: 154 mg/dL — AB (ref 70–140)
Potassium: 4.1 mEq/L (ref 3.5–5.1)
Sodium: 141 mEq/L (ref 136–145)
Total Protein: 7.5 g/dL (ref 6.4–8.3)

## 2014-05-27 LAB — CBC WITH DIFFERENTIAL/PLATELET
BASO%: 1.4 % (ref 0.0–2.0)
Basophils Absolute: 0.1 10*3/uL (ref 0.0–0.1)
EOS%: 6.8 % (ref 0.0–7.0)
Eosinophils Absolute: 0.3 10*3/uL (ref 0.0–0.5)
HCT: 42.4 % (ref 38.4–49.9)
HEMOGLOBIN: 14.1 g/dL (ref 13.0–17.1)
LYMPH#: 1.4 10*3/uL (ref 0.9–3.3)
LYMPH%: 33.4 % (ref 14.0–49.0)
MCH: 45 pg — ABNORMAL HIGH (ref 27.2–33.4)
MCHC: 33.3 g/dL (ref 32.0–36.0)
MCV: 135.2 fL — ABNORMAL HIGH (ref 79.3–98.0)
MONO#: 0.3 10*3/uL (ref 0.1–0.9)
MONO%: 6.4 % (ref 0.0–14.0)
NEUT#: 2.2 10*3/uL (ref 1.5–6.5)
NEUT%: 52 % (ref 39.0–75.0)
PLATELETS: 156 10*3/uL (ref 140–400)
RBC: 3.13 10*6/uL — ABNORMAL LOW (ref 4.20–5.82)
RDW: 13.9 % (ref 11.0–14.6)
WBC: 4.2 10*3/uL (ref 4.0–10.3)

## 2014-05-27 LAB — PROTIME-INR
INR: 2.3 (ref 2.00–3.50)
PROTIME: 27.6 s — AB (ref 10.6–13.4)

## 2014-05-27 LAB — POCT INR: INR: 2.3

## 2014-05-27 NOTE — Progress Notes (Signed)
INR = 2.3     Goal 2-3 INR within goal range. No complications of anticoagulation noted, although he says he has bruised easily the entire 2+ years he has been taking Coumadin. He has small bruises on his hands and forearms but states that he has not had any bleeding. He states that his anticoagulation had been managed by his PCP until recently. He had a RLE DVT in 2013 and has been taking Coumadin consistently since then. His dose is different than what we had recorded at his enrollment. He has been taking Coumadin 2 mg daily except 3 mg on Mondays and Thursdays. We will continue this dose and he will return on 06/24/14 at 8 am for lab; 8:30 am for Coumadin clinic. He comes the the [place] monthly for follow up of his polycythemia and he would like to [place] to coordinate CC visits with those visits.  Theone Murdoch, PharmD

## 2014-06-24 ENCOUNTER — Other Ambulatory Visit (HOSPITAL_BASED_OUTPATIENT_CLINIC_OR_DEPARTMENT_OTHER): Payer: Medicare Other

## 2014-06-24 ENCOUNTER — Ambulatory Visit (HOSPITAL_BASED_OUTPATIENT_CLINIC_OR_DEPARTMENT_OTHER): Payer: Self-pay | Admitting: Pharmacist

## 2014-06-24 DIAGNOSIS — I82409 Acute embolism and thrombosis of unspecified deep veins of unspecified lower extremity: Secondary | ICD-10-CM

## 2014-06-24 DIAGNOSIS — D751 Secondary polycythemia: Secondary | ICD-10-CM

## 2014-06-24 DIAGNOSIS — I824Z1 Acute embolism and thrombosis of unspecified deep veins of right distal lower extremity: Secondary | ICD-10-CM

## 2014-06-24 LAB — PROTIME-INR
INR: 2 (ref 2.00–3.50)
Protime: 24 Seconds — ABNORMAL HIGH (ref 10.6–13.4)

## 2014-06-24 LAB — CBC WITH DIFFERENTIAL/PLATELET
BASO%: 0.7 % (ref 0.0–2.0)
BASOS ABS: 0 10*3/uL (ref 0.0–0.1)
EOS ABS: 0.3 10*3/uL (ref 0.0–0.5)
EOS%: 6.3 % (ref 0.0–7.0)
HCT: 40.8 % (ref 38.4–49.9)
HEMOGLOBIN: 14.2 g/dL (ref 13.0–17.1)
LYMPH#: 1.9 10*3/uL (ref 0.9–3.3)
LYMPH%: 43.4 % (ref 14.0–49.0)
MCH: 45.8 pg — ABNORMAL HIGH (ref 27.2–33.4)
MCHC: 34.8 g/dL (ref 32.0–36.0)
MCV: 131.6 fL — AB (ref 79.3–98.0)
MONO#: 0.3 10*3/uL (ref 0.1–0.9)
MONO%: 7.2 % (ref 0.0–14.0)
NEUT%: 42.4 % (ref 39.0–75.0)
NEUTROS ABS: 1.9 10*3/uL (ref 1.5–6.5)
Platelets: 156 10*3/uL (ref 140–400)
RBC: 3.1 10*6/uL — ABNORMAL LOW (ref 4.20–5.82)
RDW: 12.8 % (ref 11.0–14.6)
WBC: 4.4 10*3/uL (ref 4.0–10.3)

## 2014-06-24 LAB — COMPREHENSIVE METABOLIC PANEL (CC13)
ALBUMIN: 4.3 g/dL (ref 3.5–5.0)
ALT: 17 U/L (ref 0–55)
ANION GAP: 15 meq/L — AB (ref 3–11)
AST: 24 U/L (ref 5–34)
Alkaline Phosphatase: 57 U/L (ref 40–150)
BUN: 9.3 mg/dL (ref 7.0–26.0)
CALCIUM: 9.3 mg/dL (ref 8.4–10.4)
CO2: 20 meq/L — AB (ref 22–29)
Chloride: 106 mEq/L (ref 98–109)
Creatinine: 0.9 mg/dL (ref 0.7–1.3)
EGFR: 84 mL/min/{1.73_m2} — ABNORMAL LOW (ref >90)
GLUCOSE: 144 mg/dL — AB (ref 70–140)
Potassium: 3.9 mEq/L (ref 3.5–5.1)
Sodium: 141 mEq/L (ref 136–145)
Total Bilirubin: 0.45 mg/dL (ref 0.20–1.20)
Total Protein: 7.3 g/dL (ref 6.4–8.3)

## 2014-06-24 LAB — POCT INR: INR: 2

## 2014-06-24 NOTE — Progress Notes (Signed)
Pt seen in clinic today INR=2.0 Pt states he has no changes to report If he takes anything OTC it is APAP His daughter sets up his medications for him on a weekly basis Does plan to try some cream kale he saw on a cooking show I explained its interaction with coumadin and if he intends to eat it pretty frequently let us know He agreed RTC in one month with MD appmt  Continue 2 mg daily except 3 mg on Mondays and Thursdays. Return 07/29/14 at 8:30 am for lab; 8:45 am for Coumadin clinic and 9:00 am with Dr. Burr Medico.

## 2014-06-24 NOTE — Patient Instructions (Signed)
Continue 2 mg daily except 3 mg on Mondays and Thursdays. Return 07/29/14 at 8:30 am for lab; 8:45 am for Coumadin clinic and 9:00 am with Dr. Burr Medico.

## 2014-07-02 IMAGING — US US EXTREM LOW VENOUS*R*
1 series · 14 of 24 positions shown · non-contrast
Comparison: 06/02/2011

CLINICAL DATA: Right lower extremity DVT

RIGHT LOWER EXTREMITY VENOUS DUPLEX ULTRASOUND
TECHNIQUE: Gray-scale sonography with graded compression, as well
as color Doppler and duplex ultrasound, were performed to evaluate
the deep venous system of the lower extremity from the level of the
common femoral vein through the popliteal and proximal calf veins.
Spectral Doppler was utilized to evaluate flow at rest and with
distal augmentation maneuvers.

[Series 1: us extrem low venous*right* · 14 of 36 slices shown]
[im 1/36]
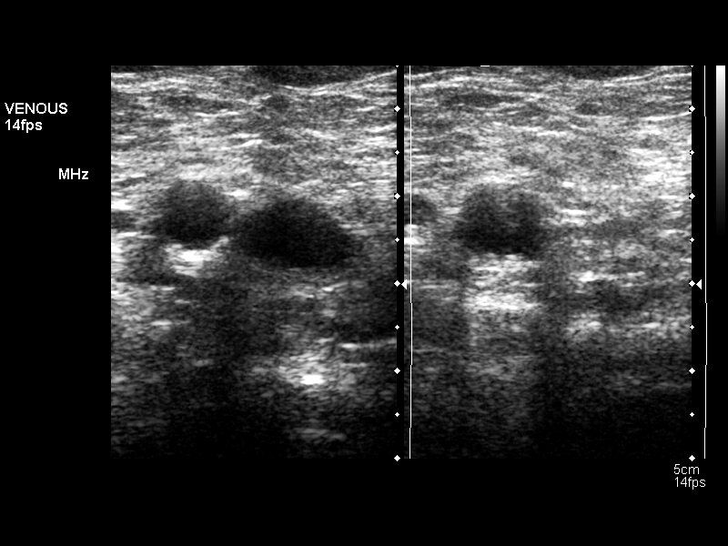
[im 4/36]
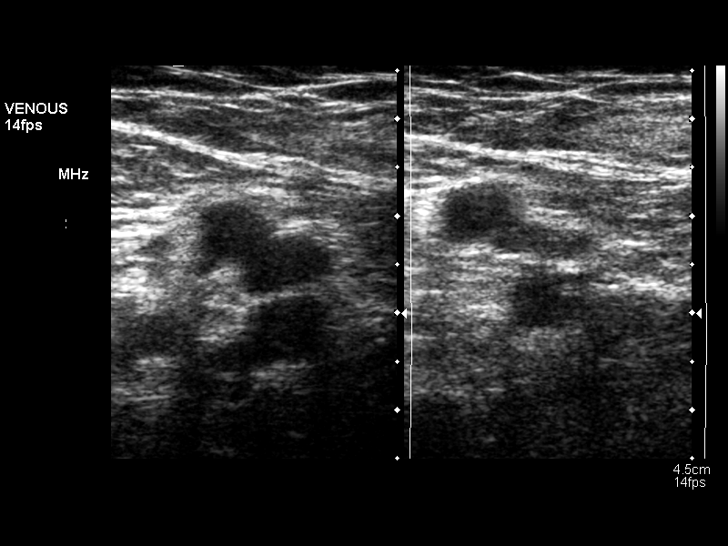
[im 7/36]
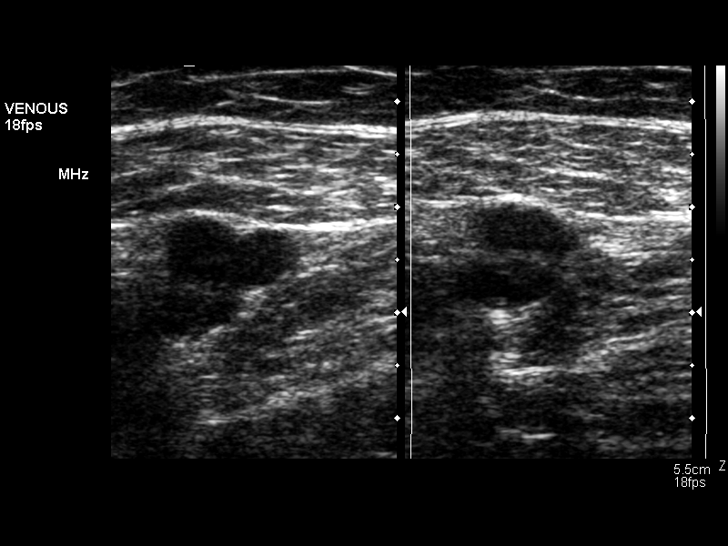
[im 10/36]
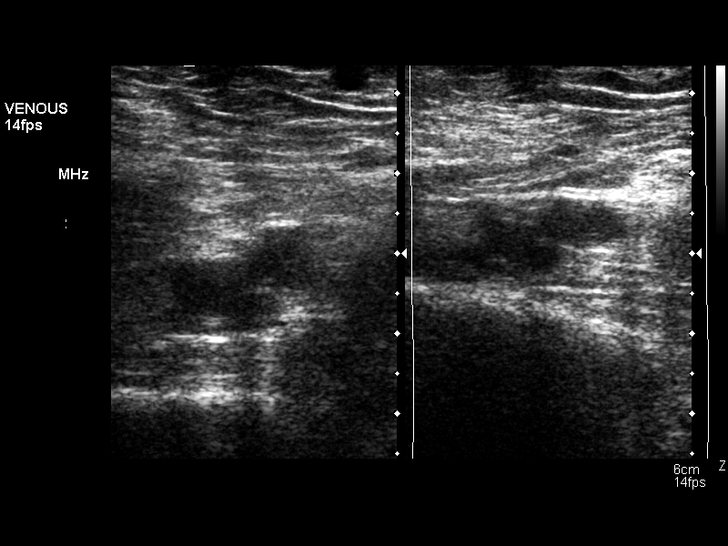
[im 11/36]
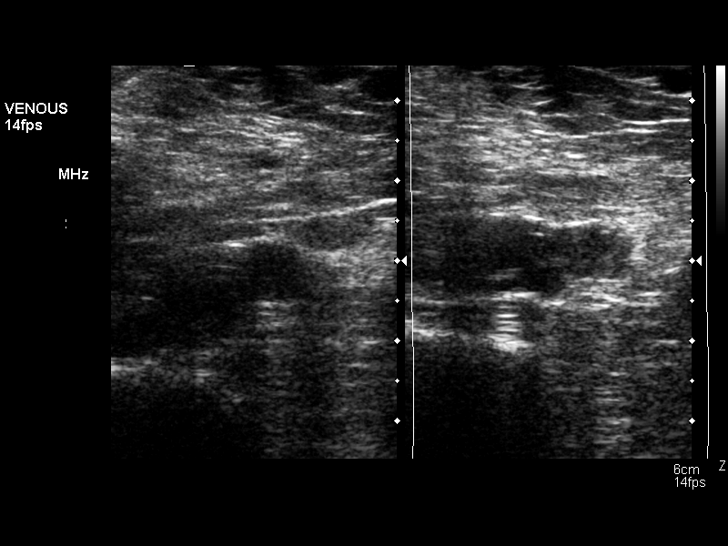
[im 14/36]
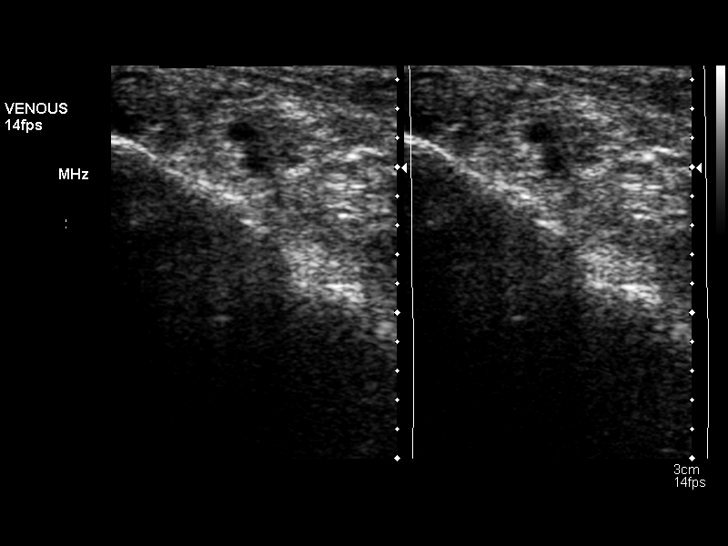
[im 17/36]
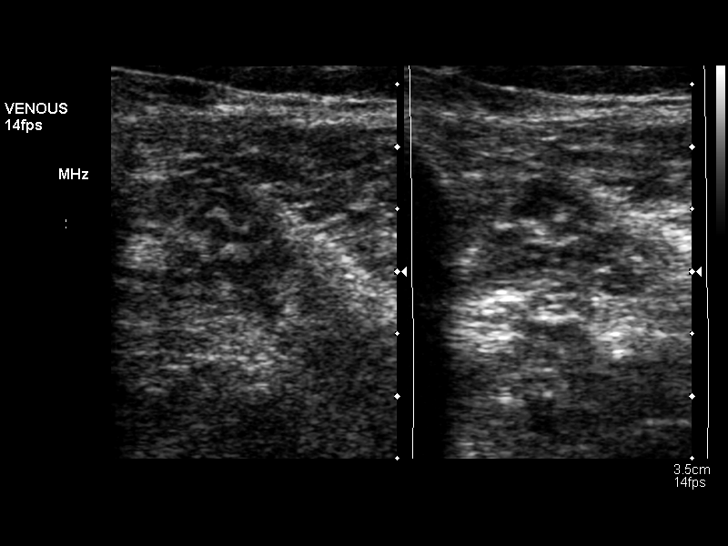
[im 19/36]
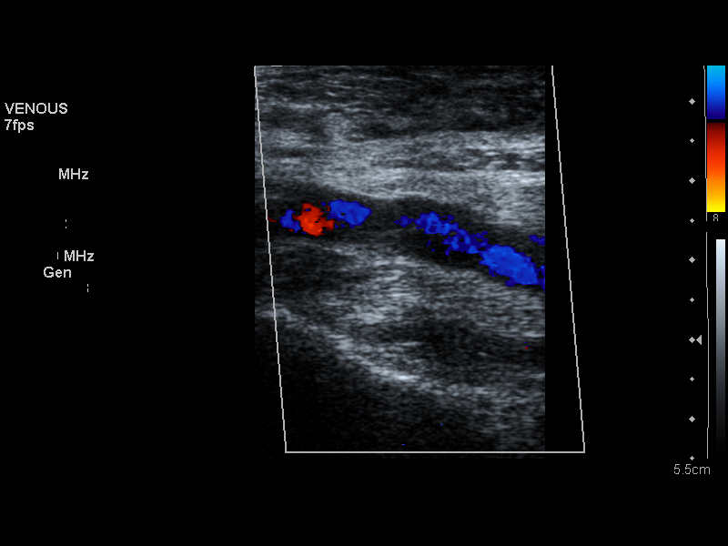
[im 22/36]
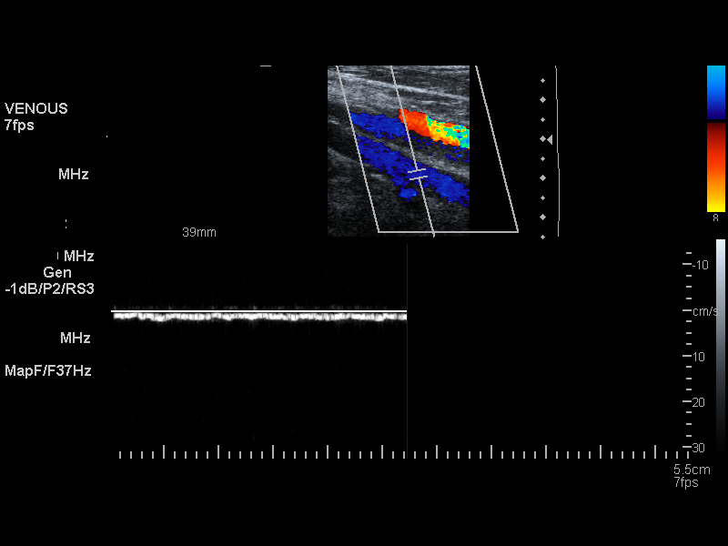
[im 25/36]
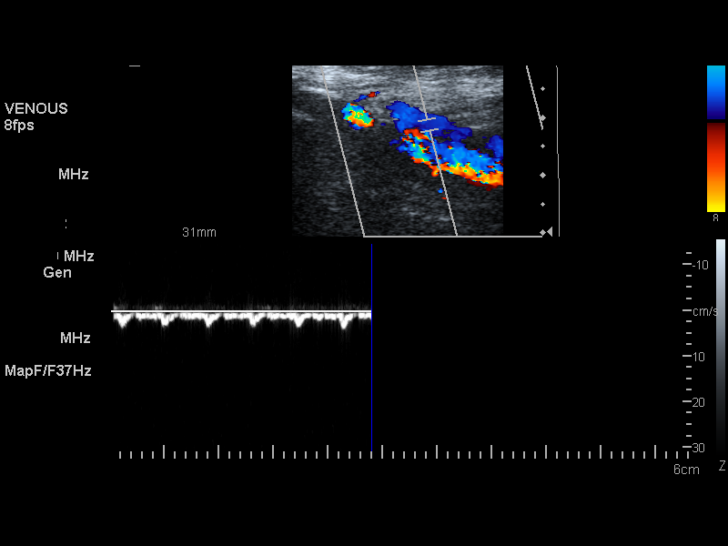
[im 28/36]
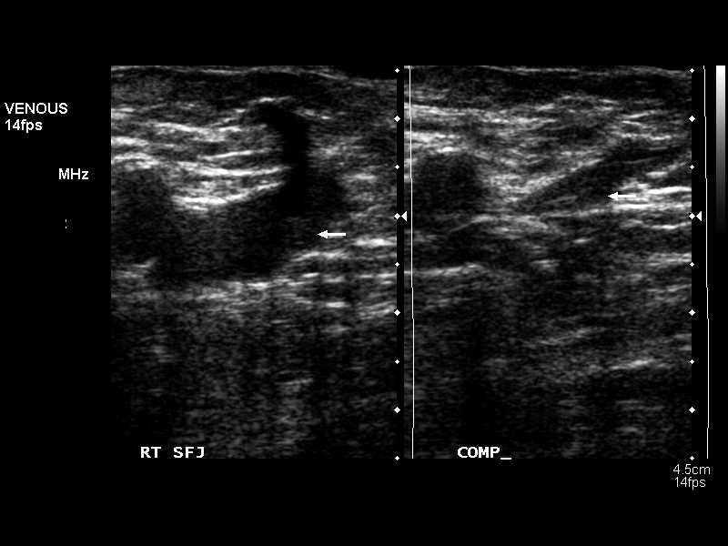
[im 29/36]
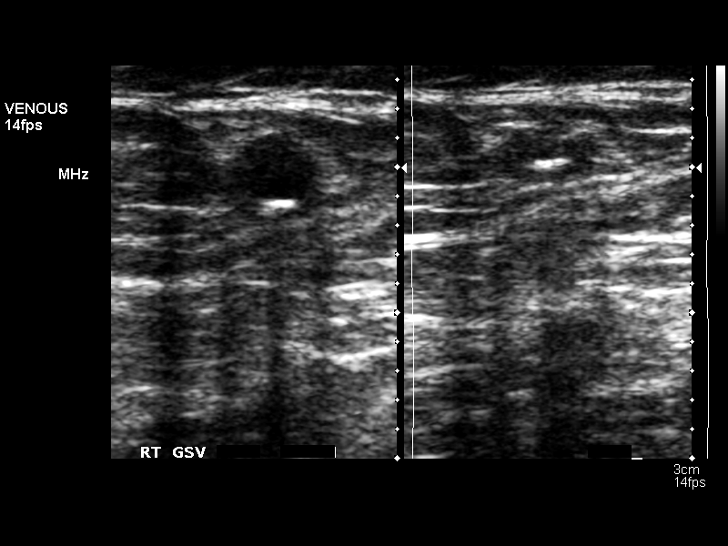
[im 32/36]
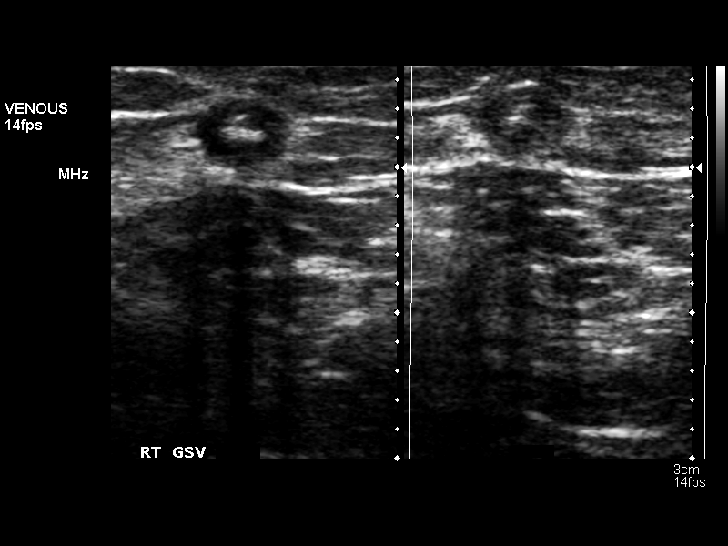
[im 36/36]
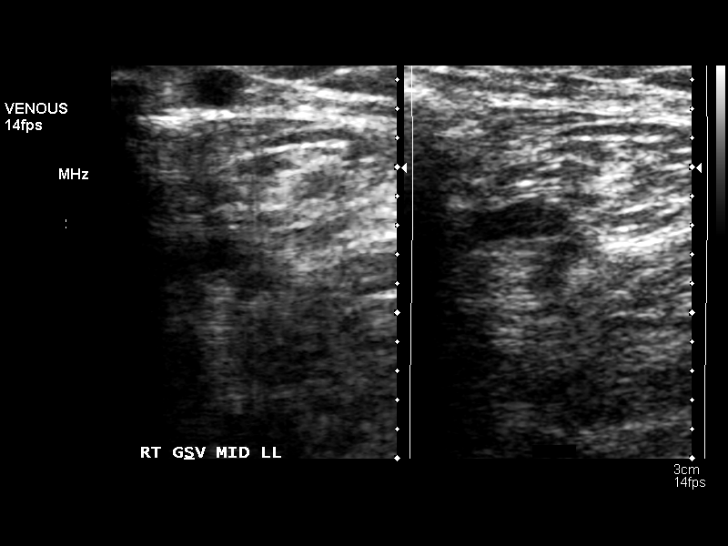

[14 of 24 positions shown; findings below may reference images not displayed]

FINDINGS: Residual echogenic intraluminal thrombus noted in the
superficial femoral and popliteal veins.  Partially occlusive
thrombus persist at the saphenofemoral junction extending into the
great saphenous vein.  Slight interval decrease in thrombus burden
throughout the right lower extremity deep veins and the superficial
saphenous vein.  No occlusive DVT demonstrated on today's study.
IMPRESSION: Residual partially occlusive right femoral popliteal DVT.  Some
interval improvement.

Residual superficial thrombosis of the great saphenous vein, little
interval change.

## 2014-07-29 ENCOUNTER — Encounter: Payer: Self-pay | Admitting: Hematology

## 2014-07-29 ENCOUNTER — Ambulatory Visit (HOSPITAL_BASED_OUTPATIENT_CLINIC_OR_DEPARTMENT_OTHER): Payer: Medicare Other | Admitting: Pharmacist

## 2014-07-29 ENCOUNTER — Telehealth: Payer: Self-pay | Admitting: Hematology

## 2014-07-29 ENCOUNTER — Other Ambulatory Visit (HOSPITAL_BASED_OUTPATIENT_CLINIC_OR_DEPARTMENT_OTHER): Payer: Medicare Other

## 2014-07-29 ENCOUNTER — Ambulatory Visit (HOSPITAL_BASED_OUTPATIENT_CLINIC_OR_DEPARTMENT_OTHER): Payer: Medicare Other | Admitting: Hematology

## 2014-07-29 VITALS — BP 152/79 | HR 100 | Temp 97.9°F | Resp 18 | Ht 66.0 in | Wt 134.5 lb

## 2014-07-29 DIAGNOSIS — Z720 Tobacco use: Secondary | ICD-10-CM

## 2014-07-29 DIAGNOSIS — I82409 Acute embolism and thrombosis of unspecified deep veins of unspecified lower extremity: Secondary | ICD-10-CM

## 2014-07-29 DIAGNOSIS — D751 Secondary polycythemia: Secondary | ICD-10-CM

## 2014-07-29 DIAGNOSIS — I824Z1 Acute embolism and thrombosis of unspecified deep veins of right distal lower extremity: Secondary | ICD-10-CM | POA: Diagnosis not present

## 2014-07-29 LAB — COMPREHENSIVE METABOLIC PANEL (CC13)
ALT: 24 U/L (ref 0–55)
ANION GAP: 13 meq/L — AB (ref 3–11)
AST: 30 U/L (ref 5–34)
Albumin: 4.3 g/dL (ref 3.5–5.0)
Alkaline Phosphatase: 57 U/L (ref 40–150)
BILIRUBIN TOTAL: 0.44 mg/dL (ref 0.20–1.20)
BUN: 7.8 mg/dL (ref 7.0–26.0)
CHLORIDE: 107 meq/L (ref 98–109)
CO2: 21 meq/L — AB (ref 22–29)
CREATININE: 1.1 mg/dL (ref 0.7–1.3)
Calcium: 9.1 mg/dL (ref 8.4–10.4)
EGFR: 72 mL/min/{1.73_m2} — ABNORMAL LOW (ref >90)
Glucose: 195 mg/dl — ABNORMAL HIGH (ref 70–140)
Potassium: 3.9 mEq/L (ref 3.5–5.1)
SODIUM: 141 meq/L (ref 136–145)
Total Protein: 7.4 g/dL (ref 6.4–8.3)

## 2014-07-29 LAB — CBC WITH DIFFERENTIAL/PLATELET
BASO%: 0.7 % (ref 0.0–2.0)
Basophils Absolute: 0 10*3/uL (ref 0.0–0.1)
EOS%: 4.9 % (ref 0.0–7.0)
Eosinophils Absolute: 0.3 10*3/uL (ref 0.0–0.5)
HEMATOCRIT: 38.8 % (ref 38.4–49.9)
HGB: 13.6 g/dL (ref 13.0–17.1)
LYMPH%: 37.7 % (ref 14.0–49.0)
MCH: 44.3 pg — ABNORMAL HIGH (ref 27.2–33.4)
MCHC: 35.1 g/dL (ref 32.0–36.0)
MCV: 126.4 fL — AB (ref 79.3–98.0)
MONO#: 0.4 10*3/uL (ref 0.1–0.9)
MONO%: 7.8 % (ref 0.0–14.0)
NEUT%: 48.9 % (ref 39.0–75.0)
NEUTROS ABS: 2.6 10*3/uL (ref 1.5–6.5)
Platelets: 194 10*3/uL (ref 140–400)
RBC: 3.07 10*6/uL — ABNORMAL LOW (ref 4.20–5.82)
RDW: 12.2 % (ref 11.0–14.6)
WBC: 5.4 10*3/uL (ref 4.0–10.3)
lymph#: 2 10*3/uL (ref 0.9–3.3)

## 2014-07-29 LAB — PROTIME-INR
INR: 2.3 (ref 2.00–3.50)
PROTIME: 27.6 s — AB (ref 10.6–13.4)

## 2014-07-29 MED ORDER — HYDROXYUREA 500 MG PO CAPS: ORAL_CAPSULE | ORAL | Status: DC

## 2014-07-29 NOTE — Progress Notes (Signed)
INR at goal today at 2.3 (goal 2-3) Pt also here for follow up with Dr. Burr Medico Pt is doing well with no complaints No missed or extra doses No diet or medication changes No unusual bleeding or bruising Plans: No changes Continue 2 mg daily except 3 mg on Mondays and Thursdays. Return 08/27/14 at 8:30 am for lab; 8:45 am for Coumadin clinic

## 2014-07-29 NOTE — Progress Notes (Signed)
[facility] HEMATOLOGY OFFICE PROGRESS NOTE 07/29/2014   LITTLE,[name] LORNE, MD [address] [place] [address]  DIAGNOSIS: Polycythemia, secondary - Plan: JAK-2 Exon 12 (only if [id number] neg)  Chief Complaint  Patient presents with  . Follow-up    CURRENT THERAPY:  Phlebotomy to maintain a hemocrit less than 45%. 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   INTERVAL HISTORY:  Isaiah York 69 y.o. male returns for follow-up of polycythemia. He presents to the clinic by himself. He is doing very well overall. He denies any leg swollen, dyspnea, pain or other new symptoms. He does not exercise, but is moderately active. He unfortunately still smoking half pack a day.   MEDICAL HISTORY: Past Medical History  Diagnosis Date  . HTN (hypertension)     INTERIM HISTORY: has Polycythemia, secondary; Tobacco abuse; DVT, lower extremity; and DVT of lower extremity (deep venous thrombosis), unspecified laterality on his problem list.    ALLERGIES:  is allergic to tape.  MEDICATIONS: has a current medication list which includes the following prescription(s): amlodipine, folic acid, hydroxyurea, losartan, pravastatin, and warfarin.  SURGICAL HISTORY: No past surgical history on file.  REVIEW OF SYSTEMS:   Constitutional: Denies fevers, chills or abnormal weight loss Eyes: Denies blurriness of vision Ears, nose, mouth, throat, and face: Denies mucositis or sore throat Respiratory: Denies cough, dyspnea or wheezes Cardiovascular: Denies palpitation, chest discomfort or lower extremity swelling Gastrointestinal:  Denies nausea, heartburn or change in bowel habits Skin: Denies abnormal skin rashes Lymphatics: Denies new lymphadenopathy or easy bruising Neurological:Denies numbness, tingling or new weaknesses Behavioral/Psych: Mood is stable,  no new changes  All other systems were reviewed with the patient and are negative.  PHYSICAL EXAMINATION: ECOG PERFORMANCE STATUS: 0  Blood pressure 152/79, pulse 100, temperature 97.9 F (36.6 C), temperature source Oral, resp. rate 18, height 5' 6" (1.676 m), weight 134 lb 8 oz (61.009 kg), SpO2 100 %.  GENERAL:alert, no distress and comfortable elderly who appears his stated age; thin SKIN: skin color, texture, turgor are normal, no rashes or significant lesions EYES: normal, Conjunctiva are pink and non-injected, sclera clear OROPHARYNX:no exudate, no erythema and lips, buccal mucosa, and tongue normal  NECK: supple, thyroid normal size, non-tender, without nodularity LYMPH:  no palpable lymphadenopathy in the cervical, axillary or inguinal LUNGS: clear to auscultation and percussion with normal breathing effort HEART: regular rate & rhythm and no murmurs and no lower extremity edema ABDOMEN:abdomen soft, non-tender and normal bowel sounds Musculoskeletal:no cyanosis of digits and no clubbing  NEURO: alert & oriented x 3 with fluent speech, no focal motor/sensory deficits   LABORATORY DATA: CBC Latest Ref Rng 07/29/2014 06/24/2014 05/27/2014  WBC 4.0 - 10.3 10e3/uL 5.4 4.4 4.2  Hemoglobin 13.0 - 17.1 g/dL 13.6 14.2 14.1  Hematocrit 38.4 - 49.9 % 38.8 40.8 42.4  Platelets 140 - 400 10e3/uL 194 156 156   CMP Latest Ref Rng 06/24/2014 05/27/2014 04/29/2014  Glucose 70 - 140 mg/dl 144(H) 154(H) 138  BUN 7.0 - 26.0 mg/dL 9.3 6.8(L) 7.0  Creatinine 0.7 - 1.3 mg/dL 0.9 1.0 1.0  Sodium 136 - 145 mEq/L 141 141 139  Potassium 3.5 - 5.1 mEq/L 3.9 4.1 4.3  CO2 22 - 29 mEq/L 20(L) 24 25  Calcium 8.4 - 10.4 mg/dL 9.3 9.4 9.0  Total Protein 6.4 - 8.3 g/dL 7.3   7.5 6.9  Total Bilirubin 0.20 - 1.20 mg/dL 0.45 0.68 0.60  Alkaline Phos 40 - 150 U/L 57 55 52  AST 5 - 34 U/L _0 ALT 0 - 55 U/L _1 oietin  Status: Finalresult Visible to patient:  Not Released Nextappt:  04/29/2014 at 08:00 AM in Oncology [facility] Lab 1) Dx:  Polycythemia         Ref Range 31yrago    Erythropoietin 2.6 - 18.5 mIU/mL 3.1         RADIOGRAPHIC STUDIES: RIGHT LOWER EXTREMITY VENOUS DUPLEX ULTRASOUND  (10/25/2011) Technique: Gray-scale sonography with graded compression, as well as color Doppler and duplex ultrasound, were performed to evaluate the deep venous system of the lower extremity from the level of the common femoral vein through the popliteal and proximal calf veins. Spectral Doppler was utilized to evaluate flow at rest and with distal augmentation maneuvers. Comparison: 06/02/2011 Findings: Residual echogenic intraluminal thrombus noted in the superficial femoral and popliteal veins. Partially occlusive thrombus persist at the saphenofemoral junction extending into the great saphenous vein. Slight interval decrease in thrombus burden throughout the right lower extremity deep veins and the superficial saphenous vein. No occlusive DVT demonstrated on today's study. IMPRESSION:  Residual partially occlusive right femoral popliteal DVT. Some interval improvement. Residual superficial thrombosis of the great saphenous vein, little interval change. Original Report Authenticated By: MJerilynn Mages TDaryll Brod M.D.   ASSESSMENT: 1) Tobacco abuse (longstanding); 2) Polycythemia; 3) Right lower extremity DVT  PLAN:  1. 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 JAK2 mutation (-), erythropoietin normal at 3.1  --We discussed with patient that his polycythemia can be secondary to smoking and again recommended to stop smoking. The patient agrees to continue reduce the amount smoking. He declined tobacco cessation referral presently.   JAK2 V617F was negative, I'll check JAK2 exon 12 mutation also. his EPO level is normal.  -We discussed that other MPN are not excluded. Bone marrow biopsy would be helpful in the diagnosis. He declined at this point.  --He does not like  phlebotomy, and was started on Hydrea. I will continue hydrea to maintain his hct less than 45% and reduce his risk of further clots including CVA, MI and DVT.  His risk factors include his age.  Common side effects include myelosuppression, anorexia, n/v/d, constipation rash, drowsiness, ALT, AST elevated, renal impairment and secondary MDS/leukemia.  He voiced good understanding.  --His hemoglobin is 13.6 today, no need for phlebotomy. PLT normal. -He is on Coumadin.  2. Recurrent RLE DVT. --He will continue coumadin for treatment for his DVT and phlebotomies prn HCT greater than or equal to 45% q monthly (patient states he would rather not have a TP and willing to take Hydrea for count control).   -He had multiple recurrent DVT and symptomatic phlebotomies, I recommend anticoagulation indefinitely. -Follow up with our Coumadin clinic, his INR level has been therapeutic  3. Tobacco abuse.  --Likely contributing to #1.   He is not willing to quit but will continue to reduce the amount of smoking.  He is down to 0.5pack daily  -I strongly encouraged him to stop smoking completely. His polycythemia may resolve after quit smoking and he may not need any treatment.  Plan -CBC monthly -continue hydrea to 5073mdaily except Wednesday and Saturday, I refilled for him today   -CBC monthly (with PCP next month) and RTC in 4 month -follow up coumadin clinic here.     All questions were  answered. The patient knows to call the clinic with any problems, questions or concerns. We can certainly see the patient much sooner if necessary.  I spent 20 minutes counseling the patient face to face. The total time spent in the appointment was 25 minutes.  Feng, Yan 07/29/2014  

## 2014-07-29 NOTE — Telephone Encounter (Signed)
Pt confirmed labs/ov per 05/24 POF, gave pt AVS and Calendar..... KJ °

## 2014-07-29 NOTE — Patient Instructions (Signed)
INR at goal  No changes Continue 2 mg daily except 3 mg on Mondays and Thursdays. Return 08/27/14 at 8:30 am for lab; 8:45 am for Coumadin clinic

## 2014-08-06 ENCOUNTER — Other Ambulatory Visit: Payer: Self-pay | Admitting: Hematology

## 2014-08-06 DIAGNOSIS — D751 Secondary polycythemia: Secondary | ICD-10-CM

## 2014-08-27 ENCOUNTER — Telehealth: Payer: Self-pay | Admitting: Hematology

## 2014-08-27 ENCOUNTER — Other Ambulatory Visit (HOSPITAL_BASED_OUTPATIENT_CLINIC_OR_DEPARTMENT_OTHER): Payer: Medicare Other

## 2014-08-27 ENCOUNTER — Ambulatory Visit: Payer: Medicare Other

## 2014-08-27 ENCOUNTER — Ambulatory Visit (HOSPITAL_BASED_OUTPATIENT_CLINIC_OR_DEPARTMENT_OTHER): Payer: Medicare Other | Admitting: Pharmacist

## 2014-08-27 DIAGNOSIS — D751 Secondary polycythemia: Secondary | ICD-10-CM

## 2014-08-27 DIAGNOSIS — Z86718 Personal history of other venous thrombosis and embolism: Secondary | ICD-10-CM

## 2014-08-27 DIAGNOSIS — I82409 Acute embolism and thrombosis of unspecified deep veins of unspecified lower extremity: Secondary | ICD-10-CM

## 2014-08-27 LAB — CBC WITH DIFFERENTIAL/PLATELET
BASO%: 0.8 % (ref 0.0–2.0)
Basophils Absolute: 0.1 10*3/uL (ref 0.0–0.1)
EOS%: 4.1 % (ref 0.0–7.0)
Eosinophils Absolute: 0.3 10*3/uL (ref 0.0–0.5)
HCT: 38.8 % (ref 38.4–49.9)
HEMOGLOBIN: 13.3 g/dL (ref 13.0–17.1)
LYMPH%: 22.6 % (ref 14.0–49.0)
MCH: 42.9 pg — ABNORMAL HIGH (ref 27.2–33.4)
MCHC: 34.4 g/dL (ref 32.0–36.0)
MCV: 124.8 fL — ABNORMAL HIGH (ref 79.3–98.0)
MONO#: 0.7 10*3/uL (ref 0.1–0.9)
MONO%: 10.9 % (ref 0.0–14.0)
NEUT%: 61.6 % (ref 39.0–75.0)
NEUTROS ABS: 4.2 10*3/uL (ref 1.5–6.5)
Platelets: 253 10*3/uL (ref 140–400)
RBC: 3.11 10*6/uL — AB (ref 4.20–5.82)
RDW: 13 % (ref 11.0–14.6)
WBC: 6.8 10*3/uL (ref 4.0–10.3)
lymph#: 1.5 10*3/uL (ref 0.9–3.3)

## 2014-08-27 LAB — COMPREHENSIVE METABOLIC PANEL (CC13)
ALK PHOS: 53 U/L (ref 40–150)
ALT: 20 U/L (ref 0–55)
ANION GAP: 8 meq/L (ref 3–11)
AST: 22 U/L (ref 5–34)
Albumin: 3.9 g/dL (ref 3.5–5.0)
BILIRUBIN TOTAL: 0.52 mg/dL (ref 0.20–1.20)
BUN: 7 mg/dL (ref 7.0–26.0)
CO2: 25 mEq/L (ref 22–29)
Calcium: 9.2 mg/dL (ref 8.4–10.4)
Chloride: 106 mEq/L (ref 98–109)
Creatinine: 0.9 mg/dL (ref 0.7–1.3)
EGFR: 88 mL/min/{1.73_m2} — ABNORMAL LOW (ref >90)
Glucose: 151 mg/dl — ABNORMAL HIGH (ref 70–140)
POTASSIUM: 4 meq/L (ref 3.5–5.1)
SODIUM: 140 meq/L (ref 136–145)
Total Protein: 7.1 g/dL (ref 6.4–8.3)

## 2014-08-27 LAB — POCT INR: INR: 3.3

## 2014-08-27 LAB — PROTIME-INR
INR: 3.3 (ref 2.00–3.50)
Protime: 39.6 Seconds — ABNORMAL HIGH (ref 10.6–13.4)

## 2014-08-27 NOTE — Telephone Encounter (Signed)
per [facility] to sch pt CC-pt aware °

## 2014-08-27 NOTE — Progress Notes (Addendum)
INR slightly above goal today. Hg/hct:  13.3/38.8, pltc = 253 Pt took coumadin as instructed. No missed or extra coumadin doses. No changes in diet or medications. Pt stated that he would like to eat salads but has not been. We reviewed consistency regarding vitamin k intake. He is mainly interested in iceberg and romaine lettuce salads, as well as infrequent kale chips. This should be fine. No unusual bruising. No bleeding noted. No s/s of clotting noted. Pt has a new puppy so he does a have a few more cuts and bruises than normal. Nothing concerning. Healing. Hold coumadin today.  On 08/28/14, continue 2mg  daily except 3 mg on Mondays and Thursdays.   Return 09/26/14 at 8:45am for lab; 9am for Coumadin clinic. If INR elevated at next visit, may need to consider decreasing coumadin dose to 2mg  daily. Pt feels he was previously on 2mg  daily for a long time.

## 2014-08-27 NOTE — Patient Instructions (Signed)
Hold coumadin today.  On 08/28/14, continue 2mg  daily except 3 mg on Mondays and Thursdays.   Return 09/26/14 at 8:45am for lab; 9am for Coumadin clinic

## 2014-09-26 ENCOUNTER — Other Ambulatory Visit (HOSPITAL_BASED_OUTPATIENT_CLINIC_OR_DEPARTMENT_OTHER): Payer: Medicare Other

## 2014-09-26 ENCOUNTER — Telehealth: Payer: Self-pay | Admitting: Hematology

## 2014-09-26 ENCOUNTER — Ambulatory Visit (HOSPITAL_BASED_OUTPATIENT_CLINIC_OR_DEPARTMENT_OTHER): Payer: Medicare Other | Admitting: Pharmacist

## 2014-09-26 ENCOUNTER — Ambulatory Visit: Payer: Medicare Other

## 2014-09-26 DIAGNOSIS — I82409 Acute embolism and thrombosis of unspecified deep veins of unspecified lower extremity: Secondary | ICD-10-CM | POA: Diagnosis not present

## 2014-09-26 DIAGNOSIS — D751 Secondary polycythemia: Secondary | ICD-10-CM | POA: Diagnosis not present

## 2014-09-26 LAB — POCT INR: INR: 2

## 2014-09-26 LAB — COMPREHENSIVE METABOLIC PANEL (CC13)
ALBUMIN: 4.3 g/dL (ref 3.5–5.0)
ALK PHOS: 64 U/L (ref 40–150)
ALT: 16 U/L (ref 0–55)
AST: 24 U/L (ref 5–34)
Anion Gap: 10 mEq/L (ref 3–11)
BUN: 11.3 mg/dL (ref 7.0–26.0)
CO2: 23 meq/L (ref 22–29)
CREATININE: 0.9 mg/dL (ref 0.7–1.3)
Calcium: 9.5 mg/dL (ref 8.4–10.4)
Chloride: 108 mEq/L (ref 98–109)
EGFR: 87 mL/min/{1.73_m2} — ABNORMAL LOW (ref >90)
Glucose: 136 mg/dl (ref 70–140)
Potassium: 4.3 mEq/L (ref 3.5–5.1)
Sodium: 141 mEq/L (ref 136–145)
Total Bilirubin: 0.5 mg/dL (ref 0.20–1.20)
Total Protein: 7.2 g/dL (ref 6.4–8.3)

## 2014-09-26 LAB — CBC WITH DIFFERENTIAL/PLATELET
BASO%: 0.6 % (ref 0.0–2.0)
BASOS ABS: 0 10*3/uL (ref 0.0–0.1)
EOS%: 4.8 % (ref 0.0–7.0)
Eosinophils Absolute: 0.3 10*3/uL (ref 0.0–0.5)
HEMATOCRIT: 40.5 % (ref 38.4–49.9)
HEMOGLOBIN: 13.9 g/dL (ref 13.0–17.1)
LYMPH%: 32.9 % (ref 14.0–49.0)
MCH: 41.1 pg — ABNORMAL HIGH (ref 27.2–33.4)
MCHC: 34.3 g/dL (ref 32.0–36.0)
MCV: 119.8 fL — ABNORMAL HIGH (ref 79.3–98.0)
MONO#: 0.5 10*3/uL (ref 0.1–0.9)
MONO%: 9.1 % (ref 0.0–14.0)
NEUT%: 52.6 % (ref 39.0–75.0)
NEUTROS ABS: 2.7 10*3/uL (ref 1.5–6.5)
PLATELETS: 206 10*3/uL (ref 140–400)
RBC: 3.38 10*6/uL — AB (ref 4.20–5.82)
RDW: 12.7 % (ref 11.0–14.6)
WBC: 5.2 10*3/uL (ref 4.0–10.3)
lymph#: 1.7 10*3/uL (ref 0.9–3.3)

## 2014-09-26 LAB — PROTIME-INR
INR: 2 (ref 2.00–3.50)
Protime: 24 Seconds — ABNORMAL HIGH (ref 10.6–13.4)

## 2014-09-26 NOTE — Progress Notes (Signed)
Pt seen in clinic today INR=2.0 No changes to current meds Added magnesium he said at night to help with legs cramps (verified Ok with his PCP) No other changes to report  Continue Coumadin 2mg  daily except 3 mg on Mondays and Thursdays.   Return 10/27/14 at 8:45am for lab; 9am for Coumadin clinic  Informed patient of CC standing and that he can discuss his options with MD when he sees her in Sept.

## 2014-09-26 NOTE — Patient Instructions (Signed)
Continue Coumadin 2mg  daily except 3 mg on Mondays and Thursdays.  Return 10/27/14 at 8:45am for lab; 9am for Coumadin clinic

## 2014-09-26 NOTE — Telephone Encounter (Signed)
per [facility] in [place] to sch CC-pt aware

## 2014-10-27 ENCOUNTER — Other Ambulatory Visit (HOSPITAL_BASED_OUTPATIENT_CLINIC_OR_DEPARTMENT_OTHER): Payer: Medicare Other

## 2014-10-27 ENCOUNTER — Ambulatory Visit (HOSPITAL_BASED_OUTPATIENT_CLINIC_OR_DEPARTMENT_OTHER): Payer: Medicare Other | Admitting: Pharmacist

## 2014-10-27 DIAGNOSIS — I82409 Acute embolism and thrombosis of unspecified deep veins of unspecified lower extremity: Secondary | ICD-10-CM

## 2014-10-27 DIAGNOSIS — D751 Secondary polycythemia: Secondary | ICD-10-CM

## 2014-10-27 LAB — PROTIME-INR
INR: 2.3 (ref 2.00–3.50)
Protime: 27.6 Seconds — ABNORMAL HIGH (ref 10.6–13.4)

## 2014-10-27 LAB — CBC WITH DIFFERENTIAL/PLATELET
BASO%: 1.2 % (ref 0.0–2.0)
BASOS ABS: 0.1 10*3/uL (ref 0.0–0.1)
EOS%: 5.3 % (ref 0.0–7.0)
Eosinophils Absolute: 0.3 10*3/uL (ref 0.0–0.5)
HCT: 41.9 % (ref 38.4–49.9)
HGB: 14.2 g/dL (ref 13.0–17.1)
LYMPH%: 37 % (ref 14.0–49.0)
MCH: 40.6 pg — AB (ref 27.2–33.4)
MCHC: 33.8 g/dL (ref 32.0–36.0)
MCV: 119.9 fL — AB (ref 79.3–98.0)
MONO#: 0.5 10*3/uL (ref 0.1–0.9)
MONO%: 9.6 % (ref 0.0–14.0)
NEUT#: 2.4 10*3/uL (ref 1.5–6.5)
NEUT%: 46.9 % (ref 39.0–75.0)
PLATELETS: 227 10*3/uL (ref 140–400)
RBC: 3.5 10*6/uL — ABNORMAL LOW (ref 4.20–5.82)
RDW: 13 % (ref 11.0–14.6)
WBC: 5.1 10*3/uL (ref 4.0–10.3)
lymph#: 1.9 10*3/uL (ref 0.9–3.3)

## 2014-10-27 LAB — COMPREHENSIVE METABOLIC PANEL (CC13)
ALT: 23 U/L (ref 0–55)
ANION GAP: 11 meq/L (ref 3–11)
AST: 24 U/L (ref 5–34)
Albumin: 4.4 g/dL (ref 3.5–5.0)
Alkaline Phosphatase: 55 U/L (ref 40–150)
BILIRUBIN TOTAL: 0.58 mg/dL (ref 0.20–1.20)
BUN: 11.5 mg/dL (ref 7.0–26.0)
CO2: 24 meq/L (ref 22–29)
Calcium: 9.6 mg/dL (ref 8.4–10.4)
Chloride: 108 mEq/L (ref 98–109)
Creatinine: 1 mg/dL (ref 0.7–1.3)
EGFR: 75 mL/min/{1.73_m2} — AB (ref >90)
Glucose: 163 mg/dl — ABNORMAL HIGH (ref 70–140)
POTASSIUM: 4.3 meq/L (ref 3.5–5.1)
Sodium: 143 mEq/L (ref 136–145)
Total Protein: 7.3 g/dL (ref 6.4–8.3)

## 2014-10-27 LAB — POCT INR: INR: 2.3

## 2014-10-27 NOTE — Patient Instructions (Addendum)
INR at goal today at 2.3 (goal 2-3) No changes

## 2014-10-27 NOTE — Progress Notes (Signed)
INR at goal today at 2.3 (goal 2-3) Pt is doing well with no complaints He has been stable on coumadin. We are seeing him once a month This is his last visit with Coumadin clinic. He will transition to Dr. Burr Medico in September He has some bruising on his arm that is healing No other bleeding complications No missed or extra doses No diet or medication changes Plan: Continue Coumadin 2mg  daily except 3 mg on Mondays and Thursdays.   Recheck INR each month Return 11/25/14 at 8:30am for lab; 9am for Dr. Burr Medico. Dr. Burr Medico will now be managing your coumadin clinic Pt will be D/C'd from coumadin clinic today

## 2014-11-24 ENCOUNTER — Telehealth: Payer: Self-pay | Admitting: Hematology

## 2014-11-24 NOTE — Telephone Encounter (Signed)
pt cld to r/s appt-gave r/s time & date 

## 2014-11-25 ENCOUNTER — Other Ambulatory Visit: Payer: Medicare Other

## 2014-11-25 ENCOUNTER — Encounter: Payer: Medicare Other | Admitting: Hematology

## 2014-12-01 ENCOUNTER — Other Ambulatory Visit: Payer: Self-pay | Admitting: Hematology

## 2014-12-08 ENCOUNTER — Other Ambulatory Visit (HOSPITAL_BASED_OUTPATIENT_CLINIC_OR_DEPARTMENT_OTHER): Payer: Medicare Other

## 2014-12-08 ENCOUNTER — Ambulatory Visit (HOSPITAL_BASED_OUTPATIENT_CLINIC_OR_DEPARTMENT_OTHER): Payer: Medicare Other | Admitting: Hematology

## 2014-12-08 ENCOUNTER — Telehealth: Payer: Self-pay | Admitting: Hematology

## 2014-12-08 ENCOUNTER — Encounter: Payer: Self-pay | Admitting: Hematology

## 2014-12-08 VITALS — BP 158/71 | HR 92 | Temp 97.9°F | Resp 18 | Ht 66.0 in | Wt 133.9 lb

## 2014-12-08 DIAGNOSIS — I82409 Acute embolism and thrombosis of unspecified deep veins of unspecified lower extremity: Secondary | ICD-10-CM

## 2014-12-08 DIAGNOSIS — D751 Secondary polycythemia: Secondary | ICD-10-CM

## 2014-12-08 DIAGNOSIS — Z720 Tobacco use: Secondary | ICD-10-CM

## 2014-12-08 DIAGNOSIS — I824Z1 Acute embolism and thrombosis of unspecified deep veins of right distal lower extremity: Secondary | ICD-10-CM

## 2014-12-08 LAB — PROTIME-INR
INR: 2.8 (ref 2.00–3.50)
Protime: 33.6 Seconds — ABNORMAL HIGH (ref 10.6–13.4)

## 2014-12-08 LAB — CBC WITH DIFFERENTIAL/PLATELET
BASO%: 1.3 % (ref 0.0–2.0)
Basophils Absolute: 0.1 10*3/uL (ref 0.0–0.1)
EOS%: 4.6 % (ref 0.0–7.0)
Eosinophils Absolute: 0.3 10*3/uL (ref 0.0–0.5)
HCT: 41.2 % (ref 38.4–49.9)
HGB: 13.8 g/dL (ref 13.0–17.1)
LYMPH%: 31 % (ref 14.0–49.0)
MCH: 39.7 pg — ABNORMAL HIGH (ref 27.2–33.4)
MCHC: 33.6 g/dL (ref 32.0–36.0)
MCV: 118.1 fL — ABNORMAL HIGH (ref 79.3–98.0)
MONO#: 0.5 10*3/uL (ref 0.1–0.9)
MONO%: 10 % (ref 0.0–14.0)
NEUT%: 53.1 % (ref 39.0–75.0)
NEUTROS ABS: 2.9 10*3/uL (ref 1.5–6.5)
Platelets: 236 10*3/uL (ref 140–400)
RBC: 3.49 10*6/uL — ABNORMAL LOW (ref 4.20–5.82)
RDW: 14 % (ref 11.0–14.6)
WBC: 5.4 10*3/uL (ref 4.0–10.3)
lymph#: 1.7 10*3/uL (ref 0.9–3.3)

## 2014-12-08 LAB — COMPREHENSIVE METABOLIC PANEL (CC13)
ALT: 22 U/L (ref 0–55)
ANION GAP: 9 meq/L (ref 3–11)
AST: 25 U/L (ref 5–34)
Albumin: 4.1 g/dL (ref 3.5–5.0)
Alkaline Phosphatase: 57 U/L (ref 40–150)
BILIRUBIN TOTAL: 0.4 mg/dL (ref 0.20–1.20)
BUN: 6.4 mg/dL — ABNORMAL LOW (ref 7.0–26.0)
CO2: 24 meq/L (ref 22–29)
Calcium: 9.1 mg/dL (ref 8.4–10.4)
Chloride: 109 mEq/L (ref 98–109)
Creatinine: 1 mg/dL (ref 0.7–1.3)
EGFR: 80 mL/min/{1.73_m2} — ABNORMAL LOW (ref >90)
GLUCOSE: 179 mg/dL — AB (ref 70–140)
POTASSIUM: 4.1 meq/L (ref 3.5–5.1)
SODIUM: 142 meq/L (ref 136–145)
TOTAL PROTEIN: 7.2 g/dL (ref 6.4–8.3)

## 2014-12-08 MED ORDER — HYDROXYUREA 500 MG PO CAPS: ORAL_CAPSULE | ORAL | Status: DC

## 2014-12-08 NOTE — Telephone Encounter (Signed)
Pt confirmed labs/ov per 10/03 POF, gave pt AVS and Calendar... KJ °

## 2014-12-08 NOTE — Progress Notes (Signed)
[place] OFFICE PROGRESS NOTE 12/08/2014   [name], MD [place] [address]  DIAGNOSIS: DVT of lower extremity (deep venous thrombosis), unspecified laterality  Polycythemia, secondary - Plan: hydroxyurea (HYDREA) 500 MG capsule   CURRENT THERAPY:  Phlebotomy to maintain a hemocrit less than 45% (has been held due to pt's preference). Hydrea 500 mg daily started on 03/25/2013 and increased to 1,000 mg daily later, and decreased back to 559m daily due to cytopenia, now on 5035mdaily 4 days a week.   INTERVAL HISTORY:  Isaiah [age]IRT89.o. male returns for follow-up of polycythemia. He presents to the clinic by himself. [place]. He is doing well overall. He still smokes half pack a day, denies any significant dyspnea, chest pain, leg swollen, or other symptoms. He has good appetite and energy level. He is independent and functional well at home. He is compliant with Hydrea, and has no noticeable side effects from medication. He is also compliant with Coumadin, no noticeable bleeding episodes.  MEDICAL HISTORY: Past Medical History  Diagnosis Date  . HTN (hypertension)     INTERIM HISTORY: has Polycythemia, secondary; Tobacco abuse; D[place], [place] extremity ([place] and DVT of lower extremity (deep venous thrombosis), unspecified laterality on his problem list.    ALLERGIES:  is allergic to tape.  MEDICATIONS: has a current medication list which includes the following prescription(s): amlodipine, folic acid, hydroxyurea, losartan, pravastatin, and warfarin.  SURGICAL HISTORY: History reviewed. No pertinent past surgical history.  REVIEW OF SYSTEMS:   Constitutional: Denies fevers, chills or abnormal weight loss Eyes: Denies blurriness of vision Ears, nose, mouth, throat, and face: Denies mucositis or sore throat Respiratory: Denies cough, dyspnea or  wheezes Cardiovascular: Denies palpitation, chest discomfort or lower extremity swelling Gastrointestinal:  Denies nausea, heartburn or change in bowel habits Skin: Denies abnormal skin rashes Lymphatics: Denies new lymphadenopathy or easy bruising Neurological:Denies numbness, tingling or new weaknesses Behavioral/Psych: Mood is stable, no new changes  All other systems were reviewed with the patient and are negative.  PHYSICAL EXAMINATION: ECOG PERFORMANCE STATUS: 0  Blood pressure 158/71, pulse 92, temperature 97.9 F (36.6 C), temperature source Oral, resp. rate 18, height _0  (1.676 m), weight 133 lb 14.4 oz (60.737 kg), SpO2 99 %.  GENERAL:alert, no distress and comfortable elderly who appears his stated age; thin SKIN: skin color, texture, turgor are normal, no rashes or significant lesions EYES: normal, Conjunctiva are pink and non-injected, sclera clear OROPHARYNX:no exudate, no erythema and lips, buccal mucosa, and tongue normal  NECK: supple, thyroid normal size, non-tender, without nodularity LYMPH:  no palpable lymphadenopathy in the cervical, axillary or inguinal LUNGS: clear to auscultation and percussion with normal breathing effort HEART: regular rate & rhythm and no murmurs and no lower extremity edema ABDOMEN:abdomen soft, non-tender and normal bowel sounds Musculoskeletal:no cyanosis of digits and no clubbing  NEURO: alert & oriented x 3 with fluent speech, no focal motor/sensory deficits   LABORATORY DATA: CBC Latest Ref Rng 12/08/2014 10/27/2014 09/26/2014  WBC 4.0 - 10.3 10e3/uL 5.4 5.1 5.2  Hemoglobin 13.0 - 17.1 g/dL 13.8 14.2 13.9  Hematocrit 38.4 - 49.9 % 41.2 41.9 40.5  Platelets 140 - 400 10e3/uL 236 227 206   CMP Latest Ref Rng 10/27/2014 09/26/2014 08/27/2014  Glucose 70 - 140 mg/dl 163(H) 136 151(H)  BUN 7.0 - 26.0 mg/dL 11.5 11.3 7.0  Creatinine 0.7 - 1.3 mg/dL  1.0 0.9 0.9  Sodium 136 - 145 mEq/L 143 141 140  Potassium 3.5 - 5.1 mEq/L 4.3 4.3 4.0   CO2 22 - 29 mEq/L _0 Calcium 8.4 - 10.4 mg/dL 9.6 9.5 9.2  Total Protein 6.4 - 8.3 g/dL 7.3 7.2 7.1  Total Bilirubin 0.20 - 1.20 mg/dL 0.58 0.50 0.52  Alkaline Phos 40 - 150 U/L 55 64 53  AST 5 - 34 U/L _1 ALT 0 - 55 U/L _2 oietin  Status: Finalresult Visible to patient:  Not Released Nextappt: 04/29/2014 at 08:00 AM in Oncology [facility] Lab 1) Dx:  Polycythemia         Ref Range 62yrago    Erythropoietin 2.6 - 18.5 mIU/mL 3.1         RADIOGRAPHIC STUDIES: RIGHT LOWER EXTREMITY VENOUS DUPLEX ULTRASOUND  (10/25/2011) Technique: Gray-scale sonography with graded compression, as well as color Doppler and duplex ultrasound, were performed to evaluate the deep venous system of the lower extremity from the level of the common femoral vein through the popliteal and proximal calf veins. Spectral Doppler was utilized to evaluate flow at rest and with distal augmentation maneuvers. Comparison: 06/02/2011 Findings: Residual echogenic intraluminal thrombus noted in the superficial femoral and popliteal veins. Partially occlusive thrombus persist at the saphenofemoral junction extending into the great saphenous vein. Slight interval decrease in thrombus burden throughout the right lower extremity deep veins and the superficial saphenous vein. No occlusive DVT demonstrated on today's study. IMPRESSION:  Residual partially occlusive right femoral popliteal DVT. Some interval improvement. Residual superficial thrombosis of the great saphenous vein, little interval change. Original Report Authenticated By: MJerilynn Mages TDaryll Brod M.D.   ASSESSMENT: 1) Tobacco abuse (longstanding); 2) Polycythemia; 3) Right lower extremity DVT  PLAN:  1. 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 JAK2 mutation (-), erythropoietin normal at 3.1  --We discussed with patient that his polycythemia can be secondary to smoking and again recommended to stop smoking. The patient agrees  to continue reduce the amount smoking. He declined tobacco cessation referral presently.   JAK2 V617F and exon 12 mutation are negative. his EPO level is normal.  -We discussed that other MPN are not excluded. Bone marrow biopsy would be helpful in the diagnosis. He declined.  --He does not like phlebotomy, and was started on Hydrea. I will continue hydrea to maintain his hct less than 45% and reduce his risk of further clots including CVA, MI and DVT.  His risk factors include his age.  Common side effects include myelosuppression, anorexia, n/v/d, constipation rash, drowsiness, ALT, AST elevated, renal impairment and secondary MDS/leukemia.  He voiced good understanding.  --His hemoglobin is 13.8 today, no need for phlebotomy. WBC and plt normal. -He is on Coumadin, no need for aspirin.  2. Recurrent RLE DVT. --He will continue coumadin for treatment for his DVT and phlebotomies prn HCT greater than or equal to 45%.  -He had multiple recurrent DVT and symptomatic phlebotomies, I recommend anticoagulation indefinitely. -He is to follow-up with our Coumadin clinic, unfortunately our clinic clinic was close last month. He will follow-up with his primary care physician for his PT/INR and Coumadin dose adjustment. -he is on coumadin 232mdaily, except 54m4mn Monday and Thursday   3. Tobacco abuse.  --Likely contributing to #1.   He is not willing to quit but will continue to reduce the amount of smoking.  He is down to 0.5pack daily  -I strongly encouraged him to stop  smoking completely. His polycythemia may resolve after quit smoking and he may not need any treatment. -We discussed lung cancer screening with CT chest, he declined today.  Plan -continue hydrea to 51m daily except Wednesday and Saturday, I refilled for him today   -follow up coumadin clinic with his PCP  -He declined flu shot, never had one before -I'll see you back in 3 months with lab.  All questions were answered. The patient  knows to call the clinic with any problems, questions or concerns. We can certainly see the patient much sooner if necessary.  I spent 20 minutes counseling the patient face to face. The total time spent in the appointment was 25 minutes.  FTruitt Merle10/05/2014

## 2014-12-15 NOTE — Progress Notes (Signed)
This encounter was created in error - please disregard.

## 2015-03-09 ENCOUNTER — Encounter: Payer: Self-pay | Admitting: Hematology

## 2015-03-09 NOTE — Progress Notes (Signed)
[facility] OFFICE PROGRESS NOTE 03/10/2015   Isaiah Pac, MD [place] [address]  DIAGNOSIS: DVT of lower extremity (deep venous thrombosis), unspecified laterality  Polycythemia, secondary - Plan: hydroxyurea (HYDREA) 500 MG capsule   CURRENT THERAPY:  Phlebotomy to maintain a hemocrit less than 45% (has been held due to pt's preference). 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   INTERVAL HISTORY:  Isaiah [age]MAIN89.o. male returns for follow-up of polycythemia. He is doing well overall. He has been compliant with Hydrea, he takes every day except Wednesday and Saturday, tolerated well, no noticeable side effects. He denies any significant pain, dyspnea, headaches, or other symptoms. He had a good holiday, is very happy that her granddaughter hydrated from college. He still smokes half pack a day.  MEDICAL HISTORY: Past Medical History  Diagnosis Date  . HTN (hypertension)     INTERIM HISTORY: has Polycythemia, secondary; Tobacco abuse; D[place], lower extremity ([place] DVT of lower extremity (deep venous thrombosis), unspecified laterality on his problem list.    ALLERGIES:  is allergic to tape.  MEDICATIONS: has a current medication list which includes the following prescription(s): amlodipine, folic acid, hydroxyurea, losartan, pravastatin, and warfarin.  SURGICAL HISTORY: History reviewed. No pertinent past surgical history.  REVIEW OF SYSTEMS:   Constitutional: Denies fevers, chills or abnormal weight loss Eyes: Denies blurriness of vision Ears, nose, mouth, throat, and face: Denies mucositis or sore throat Respiratory: Denies cough, dyspnea or wheezes Cardiovascular: Denies palpitation, chest discomfort or lower extremity swelling Gastrointestinal:  Denies nausea, heartburn or change in bowel  habits Skin: Denies abnormal skin rashes Lymphatics: Denies new lymphadenopathy or easy bruising Neurological:Denies numbness, tingling or new weaknesses Behavioral/Psych: Mood is stable, no new changes  All other systems were reviewed with the patient and are negative.  PHYSICAL EXAMINATION: ECOG PERFORMANCE STATUS: 0  Blood pressure 151/74, pulse 90, temperature 97.6 F (36.4 C), temperature source Oral, resp. rate 18, height _0  (1.676 m), weight 132 lb (59.875 kg), SpO2 100 %.  GENERAL:alert, no distress and comfortable elderly who appears his stated age; thin SKIN: skin color, texture, turgor are normal, no rashes or significant lesions EYES: normal, Conjunctiva are pink and non-injected, sclera clear OROPHARYNX:no exudate, no erythema and lips, buccal mucosa, and tongue normal  NECK: supple, thyroid normal size, non-tender, without nodularity LYMPH:  no palpable lymphadenopathy in the cervical, axillary or inguinal LUNGS: clear to auscultation and percussion with normal breathing effort HEART: regular rate & rhythm and no murmurs and no lower extremity edema ABDOMEN:abdomen soft, non-tender and normal bowel sounds Musculoskeletal:no cyanosis of digits and no clubbing  NEURO: alert & oriented x 3 with fluent speech, no focal motor/sensory deficits   LABORATORY DATA: CBC Latest Ref Rng 03/10/2015 12/08/2014 10/27/2014  WBC 4.0 - 10.3 10e3/uL 5.8 5.4 5.1  Hemoglobin 13.0 - 17.1 g/dL 15.0 13.8 14.2  Hematocrit 38.4 - 49.9 % 44.3 41.2 41.9  Platelets 140 - 400 10e3/uL 236 236 227   CMP Latest Ref Rng 12/08/2014 10/27/2014 09/26/2014  Glucose 70 - 140 mg/dl 179(H) 163(H) 136  BUN 7.0 - 26.0 mg/dL 6.4(L) 11.5 11.3  Creatinine 0.7 - 1.3 mg/dL 1.0 1.0 0.9  Sodium 136 - 145 mEq/L 142 143 141  Potassium 3.5 - 5.1 mEq/L 4.1 4.3 4.3  CO2 22 - 29 mEq/L 24  24 23  Calcium 8.4 - 10.4 mg/dL 9.1 9.6 9.5  Total Protein 6.4 - 8.3 g/dL 7.2 7.3 7.2  Total Bilirubin 0.20 - 1.20 mg/dL 0.40 0.58  0.50  Alkaline Phos 40 - 150 U/L 57 55 64  AST 5 - 34 U/L _0 ALT 0 - 55 U/L _1 oietin  Status: Finalresult Visible to patient:  Not Released Nextappt: 04/29/2014 at 08:00 AM in Oncology [facility] 1) Dx:  Polycythemia         Ref Range 43yrago    Erythropoietin 2.6 - 18.5 mIU/mL 3.1         RADIOGRAPHIC STUDIES: RIGHT LOWER EXTREMITY VENOUS DUPLEX ULTRASOUND  (10/25/2011) Technique: Gray-scale sonography with graded compression, as well as color Doppler and duplex ultrasound, were performed to evaluate the deep venous system of the lower extremity from the level of the common femoral vein through the popliteal and proximal calf veins. Spectral Doppler was utilized to evaluate flow at rest and with distal augmentation maneuvers. Comparison: 06/02/2011 Findings: Residual echogenic intraluminal thrombus noted in the superficial femoral and popliteal veins. Partially occlusive thrombus persist at the saphenofemoral junction extending into the great saphenous vein. Slight interval decrease in thrombus burden throughout the right lower extremity deep veins and the superficial saphenous vein. No occlusive DVT demonstrated on today's study. IMPRESSION:  Residual partially occlusive right femoral popliteal DVT. Some interval improvement. Residual superficial thrombosis of the great saphenous vein, little interval change. Original Report Authenticated By: MJerilynn Mages TDaryll Brod M.D.   ASSESSMENT: [id number]   PLAN:  1. 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 JAK2 mutation (-), erythropoietin normal at 3.1  --He was previously tested negative for JAK2 V617F and exon 12 mutation, his EPO level was normal.  -We previously discussed with patient that his polycythemia is likely secondary (to smoking) -I repeatedly strongly encouraged pt to quit smoking, he has cut back to half pack a day, cannot cut more. He declined tobacco cessation referral.    -We discussed  that other MPN are not excluded. Bone marrow biopsy would be helpful in the diagnosis. He declined.  --He does not like phlebotomy, and was started on Hydrea. I will continue hydrea to maintain his hct less than 45% and reduce his risk of further clots including CVA, MI and DVT. -He is on Coumadin, no need for aspirin.  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 -He will follow-up with his primary care physician for his PT/INR and Coumadin dose adjustment.  3. Tobacco abuse.  --Likely contributing to #1.   He is not willing to quit but has cut back. He is down to 0.5pack daily  -I strongly encouraged him to stop smoking completely. His polycythemia may resolve after quit smoking and he may not need any treatment. -We discussed lung cancer screening with CT chest, he declined.  Plan -continue hydrea to 5064mdaily except Wednesday and Saturday, I refilled for him today   -follow up coumadin clinic with his PCP  -He again declined flu shot, never had one before -I'll see you back in 3 months with lab.  All questions were answered. The patient knows to call the clinic with any problems, questions or concerns. We can certainly see the patient much sooner if necessary.  I spent 20 minutes counseling the patient face to face. The total time spent in the appointment was 25 minutes.  FeTruitt Merle/05/2015

## 2015-03-10 ENCOUNTER — Ambulatory Visit (HOSPITAL_BASED_OUTPATIENT_CLINIC_OR_DEPARTMENT_OTHER): Payer: Medicare Other | Admitting: Hematology

## 2015-03-10 ENCOUNTER — Other Ambulatory Visit (HOSPITAL_BASED_OUTPATIENT_CLINIC_OR_DEPARTMENT_OTHER): Payer: Medicare Other

## 2015-03-10 ENCOUNTER — Telehealth: Payer: Self-pay | Admitting: Hematology

## 2015-03-10 VITALS — BP 151/74 | HR 90 | Temp 97.6°F | Resp 18 | Ht 66.0 in | Wt 132.0 lb

## 2015-03-10 DIAGNOSIS — I82409 Acute embolism and thrombosis of unspecified deep veins of unspecified lower extremity: Secondary | ICD-10-CM | POA: Diagnosis not present

## 2015-03-10 DIAGNOSIS — D751 Secondary polycythemia: Secondary | ICD-10-CM

## 2015-03-10 DIAGNOSIS — Z720 Tobacco use: Secondary | ICD-10-CM | POA: Diagnosis not present

## 2015-03-10 LAB — COMPREHENSIVE METABOLIC PANEL WITH GFR
ALT: 19 U/L (ref 0–55)
AST: 22 U/L (ref 5–34)
Albumin: 4.2 g/dL (ref 3.5–5.0)
Alkaline Phosphatase: 47 U/L (ref 40–150)
Anion Gap: 9 meq/L (ref 3–11)
BUN: 8.5 mg/dL (ref 7.0–26.0)
CO2: 24 meq/L (ref 22–29)
Calcium: 9.4 mg/dL (ref 8.4–10.4)
Chloride: 108 meq/L (ref 98–109)
Creatinine: 1 mg/dL (ref 0.7–1.3)
EGFR: 73 ml/min/1.73 m2 — ABNORMAL LOW
Glucose: 148 mg/dL — ABNORMAL HIGH (ref 70–140)
Potassium: 3.9 meq/L (ref 3.5–5.1)
Sodium: 141 meq/L (ref 136–145)
Total Bilirubin: 0.42 mg/dL (ref 0.20–1.20)
Total Protein: 7.6 g/dL (ref 6.4–8.3)

## 2015-03-10 LAB — CBC WITH DIFFERENTIAL/PLATELET
BASO%: 0.9 % (ref 0.0–2.0)
Basophils Absolute: 0 10e3/uL (ref 0.0–0.1)
EOS%: 3 % (ref 0.0–7.0)
Eosinophils Absolute: 0.2 10e3/uL (ref 0.0–0.5)
HCT: 44.3 % (ref 38.4–49.9)
HGB: 15 g/dL (ref 13.0–17.1)
LYMPH%: 26.2 % (ref 14.0–49.0)
MCH: 40.2 pg — ABNORMAL HIGH (ref 27.2–33.4)
MCHC: 33.9 g/dL (ref 32.0–36.0)
MCV: 118.6 fL — ABNORMAL HIGH (ref 79.3–98.0)
MONO#: 0.5 10e3/uL (ref 0.1–0.9)
MONO%: 8.2 % (ref 0.0–14.0)
NEUT#: 3.5 10e3/uL (ref 1.5–6.5)
NEUT%: 61.7 % (ref 39.0–75.0)
Platelets: 236 10e3/uL (ref 140–400)
RBC: 3.73 10e6/uL — ABNORMAL LOW (ref 4.20–5.82)
RDW: 14.7 % — ABNORMAL HIGH (ref 11.0–14.6)
WBC: 5.8 10e3/uL (ref 4.0–10.3)
lymph#: 1.5 10e3/uL (ref 0.9–3.3)

## 2015-03-10 MED ORDER — HYDROXYUREA 500 MG PO CAPS: ORAL_CAPSULE | ORAL | Status: DC

## 2015-03-10 NOTE — Telephone Encounter (Signed)
per pof to sch pt appt-gave pt copy of avs °

## 2015-06-08 ENCOUNTER — Encounter: Payer: Self-pay | Admitting: Hematology

## 2015-06-08 ENCOUNTER — Other Ambulatory Visit (HOSPITAL_BASED_OUTPATIENT_CLINIC_OR_DEPARTMENT_OTHER): Payer: Medicare Other

## 2015-06-08 ENCOUNTER — Telehealth: Payer: Self-pay | Admitting: Hematology

## 2015-06-08 ENCOUNTER — Ambulatory Visit (HOSPITAL_BASED_OUTPATIENT_CLINIC_OR_DEPARTMENT_OTHER): Payer: Medicare Other | Admitting: Hematology

## 2015-06-08 DIAGNOSIS — Z720 Tobacco use: Secondary | ICD-10-CM | POA: Diagnosis not present

## 2015-06-08 DIAGNOSIS — D751 Secondary polycythemia: Secondary | ICD-10-CM | POA: Diagnosis not present

## 2015-06-08 DIAGNOSIS — Z86718 Personal history of other venous thrombosis and embolism: Secondary | ICD-10-CM | POA: Diagnosis not present

## 2015-06-08 LAB — CBC WITH DIFFERENTIAL/PLATELET
BASO%: 1 % (ref 0.0–2.0)
Basophils Absolute: 0.1 10*3/uL (ref 0.0–0.1)
EOS%: 4.9 % (ref 0.0–7.0)
Eosinophils Absolute: 0.2 10*3/uL (ref 0.0–0.5)
HCT: 44.2 % (ref 38.4–49.9)
HEMOGLOBIN: 14.8 g/dL (ref 13.0–17.1)
LYMPH%: 36.7 % (ref 14.0–49.0)
MCH: 40.9 pg — AB (ref 27.2–33.4)
MCHC: 33.6 g/dL (ref 32.0–36.0)
MCV: 121.8 fL — ABNORMAL HIGH (ref 79.3–98.0)
MONO#: 0.5 10*3/uL (ref 0.1–0.9)
MONO%: 10.1 % (ref 0.0–14.0)
NEUT%: 47.3 % (ref 39.0–75.0)
NEUTROS ABS: 2.3 10*3/uL (ref 1.5–6.5)
Platelets: 220 10*3/uL (ref 140–400)
RBC: 3.63 10*6/uL — ABNORMAL LOW (ref 4.20–5.82)
RDW: 15.2 % — AB (ref 11.0–14.6)
WBC: 4.9 10*3/uL (ref 4.0–10.3)
lymph#: 1.8 10*3/uL (ref 0.9–3.3)

## 2015-06-08 LAB — COMPREHENSIVE METABOLIC PANEL
ALBUMIN: 4.3 g/dL (ref 3.5–5.0)
ALK PHOS: 47 U/L (ref 40–150)
ALT: 19 U/L (ref 0–55)
AST: 26 U/L (ref 5–34)
Anion Gap: 10 mEq/L (ref 3–11)
BUN: 6.9 mg/dL — AB (ref 7.0–26.0)
CO2: 21 meq/L — AB (ref 22–29)
Calcium: 9.7 mg/dL (ref 8.4–10.4)
Chloride: 108 mEq/L (ref 98–109)
Creatinine: 1 mg/dL (ref 0.7–1.3)
EGFR: 75 mL/min/{1.73_m2} — ABNORMAL LOW (ref >90)
GLUCOSE: 141 mg/dL — AB (ref 70–140)
POTASSIUM: 4.3 meq/L (ref 3.5–5.1)
SODIUM: 140 meq/L (ref 136–145)
TOTAL PROTEIN: 7.8 g/dL (ref 6.4–8.3)
Total Bilirubin: 0.58 mg/dL (ref 0.20–1.20)

## 2015-06-08 MED ORDER — HYDROXYUREA 500 MG PO CAPS: ORAL_CAPSULE | ORAL | Status: DC

## 2015-06-08 NOTE — Progress Notes (Signed)
[facility] OFFICE PROGRESS NOTE 06/08/2015   [name], MD [place] [address]  DIAGNOSIS: Polycythemia, secondary - Plan: hydroxyurea (HYDREA) 500 MG capsule  Tobacco abuse  History of DVT (deep vein thrombosis)   CURRENT THERAPY:  Phlebotomy to maintain a hemocrit less than 45% (has been held due to pt's preference). Hydrea 500 mg daily started on 03/25/2013 and increased to 1,000 mg daily later, and decreased back to 583m daily due to cytopenia, now on 5059mdaily 4 days a week.   INTERVAL HISTORY:  Isaiah KUBITZ89.o. male returns for follow-up of polycythemia. He is doing well overall. He takes Hydrea 500 mg daily 4 weeks a week, his daughter puts the pill in his pill box once a week. He is tolerating the medication very well, denies any new symptoms. He still smokes half pack a day, does not want to quit. He denies any significant dyspnea, no cough, chest pain or other symptoms.  MEDICAL HISTORY: Past Medical History  Diagnosis Date  . HTN (hypertension)     INTERIM HISTORY: has Polycythemia, secondary; Tobacco abuse; D[place], lower extremity ([place] DVT of lower extremity (deep venous thrombosis), unspecified laterality; and History of DVT (deep vein thrombosis) on his problem list.    ALLERGIES:  is allergic to tape.  MEDICATIONS: has a current medication list which includes the following prescription(s): amlodipine, folic acid, hydroxyurea, losartan, pravastatin, and warfarin.  SURGICAL HISTORY: History reviewed. No pertinent past surgical history.  REVIEW OF SYSTEMS:   Constitutional: Denies fevers, chills or abnormal weight loss Eyes: Denies blurriness of vision Ears, nose, mouth, throat, and face: Denies mucositis or sore throat Respiratory: Denies cough, dyspnea or wheezes Cardiovascular: Denies palpitation, chest discomfort or lower extremity swelling Gastrointestinal:  Denies nausea, heartburn or change in  bowel habits Skin: Denies abnormal skin rashes Lymphatics: Denies new lymphadenopathy or easy bruising Neurological:Denies numbness, tingling or new weaknesses Behavioral/Psych: Mood is stable, no new changes  All other systems were reviewed with the patient and are negative.  PHYSICAL EXAMINATION: ECOG PERFORMANCE STATUS: 0 Pressure 161/80, heart rate 95, respiratory rate 17, temperature 98.0, pulse ox 100% on room air weight 130.6 pounds GENERAL:alert, no distress and comfortable elderly who appears his stated age; thin SKIN: skin color, texture, turgor are normal, no rashes or significant lesions EYES: normal, Conjunctiva are pink and non-injected, sclera clear OROPHARYNX:no exudate, no erythema and lips, buccal mucosa, and tongue normal  NECK: supple, thyroid normal size, non-tender, without nodularity LYMPH:  no palpable lymphadenopathy in the cervical, axillary or inguinal LUNGS: clear to auscultation and percussion with normal breathing effort HEART: regular rate & rhythm and no murmurs and no lower extremity edema ABDOMEN:abdomen soft, non-tender and normal bowel sounds Musculoskeletal:no cyanosis of digits and no clubbing  NEURO: alert & oriented x 3 with fluent speech, no focal motor/sensory deficits   LABORATORY DATA: CBC Latest Ref Rng 06/08/2015 03/10/2015 12/08/2014  WBC 4.0 - 10.3 10e3/uL 4.9 5.8 5.4  Hemoglobin 13.0 - 17.1 g/dL 14.8 15.0 13.8  Hematocrit 38.4 - 49.9 % 44.2 44.3 41.2  Platelets 140 - 400 10e3/uL 220 236 236   CMP Latest Ref Rng 03/10/2015 12/08/2014 10/27/2014  Glucose 70 - 140 mg/dl 148(H) 179(H) 163(H)  BUN 7.0 - 26.0 mg/dL 8.5 6.4(L) 11.5  Creatinine 0.7 - 1.3 mg/dL 1.0 1.0 1.0  Sodium 136 - 145 mEq/L 141 142 143  Potassium 3.5 - 5.1 mEq/L 3.9 4.1 4.3  CO2 22 - 29 mEq/L 24  24 24  Calcium 8.4 - 10.4 mg/dL 9.4 9.1 9.6  Total Protein 6.4 - 8.3 g/dL 7.6 7.2 7.3  Total Bilirubin 0.20 - 1.20 mg/dL 0.42 0.40 0.58  Alkaline Phos 40 - 150 U/L 47 57 55  AST 5  - 34 U/L _0 ALT 0 - 55 U/L _1 oietin  Status: Finalresult Visible to patient:  Not Released Nextappt: 04/29/2014 at 08:00 AM in Oncology [facility] Lab 1) Dx:  Polycythemia         Ref Range 70yrago    Erythropoietin 2.6 - 18.5 mIU/mL 3.1         RADIOGRAPHIC STUDIES: RIGHT LOWER EXTREMITY VENOUS DUPLEX ULTRASOUND  (10/25/2011) Technique: Gray-scale sonography with graded compression, as well as color Doppler and duplex ultrasound, were performed to evaluate the deep venous system of the lower extremity from the level of the common femoral vein through the popliteal and proximal calf veins. Spectral Doppler was utilized to evaluate flow at rest and with distal augmentation maneuvers. Comparison: 06/02/2011 Findings: Residual echogenic intraluminal thrombus noted in the superficial femoral and popliteal veins. Partially occlusive thrombus persist at the saphenofemoral junction extending into the great saphenous vein. Slight interval decrease in thrombus burden throughout the right lower extremity deep veins and the superficial saphenous vein. No occlusive DVT demonstrated on today's study. IMPRESSION:  Residual partially occlusive right femoral popliteal DVT. Some interval improvement. Residual superficial thrombosis of the great saphenous vein, little interval change. Original Report Authenticated By: MJerilynn Mages TDaryll Brod M.D.   ASSESSMENT: [id number]   PLAN:  1. 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 JAK2 mutation (-), erythropoietin normal at 3.1  --He was previously tested negative for JAK2 V617F and exon 12 mutation, his EPO level was normal.  -We previously discussed with patient that his polycythemia is likely secondary (to smoking) -I repeatedly strongly encouraged pt to quit smoking, he has cut back to half pack a day, cannot cut more. He declined tobacco cessation referral.    -We discussed that other MPN are not excluded. Bone marrow biopsy  would be helpful in the diagnosis. He declined.  --He does not like phlebotomy, and was started on Hydrea. I will continue hydrea to maintain his hct less than 45% and reduce his risk of further clots including CVA, MI and DVT. -He is on Coumadin, no need for aspirin. -His hematocrit has been under 45% most time lately, we'll continue Hydrea and the monitoring.  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 -He will follow-up with his primary care physician for his PT/INR and Coumadin dose adjustment.  3. Tobacco abuse.  --Likely contributing to #1.   He is not willing to quit but has cut back. He is down to 0.5pack daily  -I strongly encouraged him to stop smoking completely. His polycythemia may resolve after quit smoking and he may not need any treatment. -We discussed lung cancer screening with CT chest again,  he declined.  Plan -continue hydrea to 5027mdaily except Wednesday and Saturday, I refilled for him today   -follow up coumadin clinic with his PCP -Repeat a CBC in 3 months,  I'll see you back in 6 months with lab.  All questions were answered. The patient knows to call the clinic with any problems, questions or concerns. We can certainly see the patient much sooner if necessary.  I spent 15 minutes counseling the patient face to face. The total time spent in the  appointment was 20 minutes.  Truitt Merle 06/08/2015

## 2015-06-08 NOTE — Telephone Encounter (Signed)
Gave patient avs report and appointments for July and October.  °

## 2015-06-24 ENCOUNTER — Other Ambulatory Visit: Payer: Self-pay | Admitting: Hematology

## 2015-08-20 ENCOUNTER — Telehealth: Payer: Self-pay | Admitting: Hematology

## 2015-09-02 ENCOUNTER — Telehealth: Payer: Self-pay | Admitting: Hematology

## 2015-09-02 NOTE — Telephone Encounter (Signed)
S.w. Pt dtr and r/s appt....ok and aware of new d.t

## 2015-09-07 ENCOUNTER — Other Ambulatory Visit: Payer: Medicare Other

## 2015-09-09 ENCOUNTER — Other Ambulatory Visit (HOSPITAL_BASED_OUTPATIENT_CLINIC_OR_DEPARTMENT_OTHER): Payer: Medicare Other

## 2015-09-09 DIAGNOSIS — D751 Secondary polycythemia: Secondary | ICD-10-CM

## 2015-09-09 LAB — COMPREHENSIVE METABOLIC PANEL
ALBUMIN: 4.2 g/dL (ref 3.5–5.0)
ALT: 17 U/L (ref 0–55)
AST: 27 U/L (ref 5–34)
Alkaline Phosphatase: 46 U/L (ref 40–150)
Anion Gap: 11 mEq/L (ref 3–11)
BUN: 6.3 mg/dL — AB (ref 7.0–26.0)
CALCIUM: 9.6 mg/dL (ref 8.4–10.4)
CHLORIDE: 105 meq/L (ref 98–109)
CO2: 25 mEq/L (ref 22–29)
CREATININE: 1 mg/dL (ref 0.7–1.3)
EGFR: 74 mL/min/{1.73_m2} — ABNORMAL LOW (ref >90)
Glucose: 144 mg/dl — ABNORMAL HIGH (ref 70–140)
Potassium: 4 mEq/L (ref 3.5–5.1)
Sodium: 140 mEq/L (ref 136–145)
TOTAL PROTEIN: 7.6 g/dL (ref 6.4–8.3)
Total Bilirubin: 0.59 mg/dL (ref 0.20–1.20)

## 2015-09-09 LAB — CBC WITH DIFFERENTIAL/PLATELET
BASO%: 1.9 % (ref 0.0–2.0)
Basophils Absolute: 0.1 10*3/uL (ref 0.0–0.1)
EOS ABS: 0.2 10*3/uL (ref 0.0–0.5)
EOS%: 4.8 % (ref 0.0–7.0)
HCT: 43.3 % (ref 38.4–49.9)
HEMOGLOBIN: 14.8 g/dL (ref 13.0–17.1)
LYMPH%: 32.8 % (ref 14.0–49.0)
MCH: 41.5 pg — ABNORMAL HIGH (ref 27.2–33.4)
MCHC: 34.2 g/dL (ref 32.0–36.0)
MCV: 121.3 fL — AB (ref 79.3–98.0)
MONO#: 0.5 10*3/uL (ref 0.1–0.9)
MONO%: 10 % (ref 0.0–14.0)
NEUT#: 2.4 10*3/uL (ref 1.5–6.5)
NEUT%: 50.5 % (ref 39.0–75.0)
PLATELETS: 205 10*3/uL (ref 140–400)
RBC: 3.57 10*6/uL — ABNORMAL LOW (ref 4.20–5.82)
RDW: 14.1 % (ref 11.0–14.6)
WBC: 4.8 10*3/uL (ref 4.0–10.3)
lymph#: 1.6 10*3/uL (ref 0.9–3.3)

## 2015-11-07 ENCOUNTER — Other Ambulatory Visit: Payer: Self-pay | Admitting: Hematology

## 2015-11-07 DIAGNOSIS — D751 Secondary polycythemia: Secondary | ICD-10-CM

## 2015-11-27 ENCOUNTER — Other Ambulatory Visit: Payer: Self-pay | Admitting: Family Medicine

## 2015-11-27 DIAGNOSIS — F17200 Nicotine dependence, unspecified, uncomplicated: Secondary | ICD-10-CM

## 2015-12-07 ENCOUNTER — Other Ambulatory Visit (HOSPITAL_BASED_OUTPATIENT_CLINIC_OR_DEPARTMENT_OTHER): Payer: Medicare Other

## 2015-12-07 ENCOUNTER — Encounter: Payer: Self-pay | Admitting: Hematology

## 2015-12-07 ENCOUNTER — Ambulatory Visit (HOSPITAL_BASED_OUTPATIENT_CLINIC_OR_DEPARTMENT_OTHER): Payer: Medicare Other | Admitting: Hematology

## 2015-12-07 ENCOUNTER — Telehealth: Payer: Self-pay | Admitting: Hematology

## 2015-12-07 VITALS — BP 158/66 | HR 86 | Temp 98.0°F | Resp 18 | Ht 66.0 in | Wt 130.1 lb

## 2015-12-07 DIAGNOSIS — Z720 Tobacco use: Secondary | ICD-10-CM | POA: Diagnosis not present

## 2015-12-07 DIAGNOSIS — Z86718 Personal history of other venous thrombosis and embolism: Secondary | ICD-10-CM

## 2015-12-07 DIAGNOSIS — I82401 Acute embolism and thrombosis of unspecified deep veins of right lower extremity: Secondary | ICD-10-CM | POA: Diagnosis not present

## 2015-12-07 DIAGNOSIS — D751 Secondary polycythemia: Secondary | ICD-10-CM

## 2015-12-07 LAB — COMPREHENSIVE METABOLIC PANEL
ALBUMIN: 4 g/dL (ref 3.5–5.0)
ALK PHOS: 55 U/L (ref 40–150)
ALT: 16 U/L (ref 0–55)
ANION GAP: 11 meq/L (ref 3–11)
AST: 25 U/L (ref 5–34)
BILIRUBIN TOTAL: 0.41 mg/dL (ref 0.20–1.20)
BUN: 9.3 mg/dL (ref 7.0–26.0)
CALCIUM: 9.6 mg/dL (ref 8.4–10.4)
CO2: 23 mEq/L (ref 22–29)
Chloride: 108 mEq/L (ref 98–109)
Creatinine: 1 mg/dL (ref 0.7–1.3)
EGFR: 77 mL/min/{1.73_m2} — AB (ref >90)
Glucose: 146 mg/dl — ABNORMAL HIGH (ref 70–140)
POTASSIUM: 4.2 meq/L (ref 3.5–5.1)
Sodium: 142 mEq/L (ref 136–145)
TOTAL PROTEIN: 7.4 g/dL (ref 6.4–8.3)

## 2015-12-07 LAB — CBC WITH DIFFERENTIAL/PLATELET
BASO%: 1.1 % (ref 0.0–2.0)
BASOS ABS: 0.1 10*3/uL (ref 0.0–0.1)
EOS ABS: 0.3 10*3/uL (ref 0.0–0.5)
EOS%: 5.2 % (ref 0.0–7.0)
HEMATOCRIT: 44.9 % (ref 38.4–49.9)
HEMOGLOBIN: 15.4 g/dL (ref 13.0–17.1)
LYMPH#: 2.4 10*3/uL (ref 0.9–3.3)
LYMPH%: 43.8 % (ref 14.0–49.0)
MCH: 40.1 pg — AB (ref 27.2–33.4)
MCHC: 34.3 g/dL (ref 32.0–36.0)
MCV: 116.9 fL — AB (ref 79.3–98.0)
MONO#: 0.6 10*3/uL (ref 0.1–0.9)
MONO%: 11 % (ref 0.0–14.0)
NEUT#: 2.2 10*3/uL (ref 1.5–6.5)
NEUT%: 38.9 % — ABNORMAL LOW (ref 39.0–75.0)
PLATELETS: 240 10*3/uL (ref 140–400)
RBC: 3.84 10*6/uL — ABNORMAL LOW (ref 4.20–5.82)
RDW: 13.3 % (ref 11.0–14.6)
WBC: 5.6 10*3/uL (ref 4.0–10.3)

## 2015-12-07 NOTE — Telephone Encounter (Signed)
Avs report and appointment schedule given to patient per 12/07/15 los. °

## 2015-12-07 NOTE — Progress Notes (Signed)
[facility] OFFICE PROGRESS NOTE 12/07/2015   Isaiah Pac, MD [place] [address]  DIAGNOSIS: Polycythemia, secondary  History of DVT (deep vein thrombosis)   CURRENT THERAPY:  Phlebotomy to maintain a hemocrit less than 45% (has been held due to pt's preference). Hydrea 500 mg daily started on 03/25/2013 and increased to 1,000 mg daily later, and decreased back to 565m daily due to cytopenia, now on 5068mdaily 5 days a week.   INTERVAL HISTORY:  Isaiah AULD89.o. male returns for follow-up of polycythemia. He is doing well overall. He takes Hydrea 500 mg daily except Wednesday and Saturday. He tolerates well, no noticeable side effects. He has been compliant with medication. He is still actively smoking, does not plan to quit. He has been going to his primary care physician's office for INR checkup, and recently had annual physical. He is doing well overall.  MEDICAL HISTORY: Past Medical History:  Diagnosis Date  . HTN (hypertension)     INTERIM HISTORY: has Polycythemia, secondary; Tobacco abuse; DVT, lower extremity ([place] DVT of lower extremity (deep venous thrombosis), unspecified laterality; and History of DVT (deep vein thrombosis) on his problem list.    ALLERGIES:  is allergic to tape.  MEDICATIONS: has a current medication list which includes the following prescription(s): amlodipine, folic acid, hydroxyurea, losartan, pravastatin, and warfarin.  SURGICAL HISTORY: History reviewed. No pertinent surgical history.  REVIEW OF SYSTEMS:   Constitutional: Denies fevers, chills or abnormal weight loss Eyes: Denies blurriness of vision Ears, nose, mouth, throat, and face: Denies mucositis or sore throat Respiratory: Denies cough, dyspnea or wheezes Cardiovascular: Denies palpitation, chest discomfort or lower extremity swelling Gastrointestinal:  Denies nausea, heartburn or change in bowel habits Skin: Denies  abnormal skin rashes Lymphatics: Denies new lymphadenopathy or easy bruising Neurological:Denies numbness, tingling or new weaknesses Behavioral/Psych: Mood is stable, no new changes  All other systems were reviewed with the patient and are negative.  PHYSICAL EXAMINATION: ECOG PERFORMANCE STATUS: 0 Pressure 161/80, heart rate 95, respiratory rate 17, temperature 98.0, pulse ox 100% on room air weight 130.6 pounds GENERAL:alert, no distress and comfortable elderly who appears his stated age; thin SKIN: skin color, texture, turgor are normal, no rashes or significant lesions EYES: normal, Conjunctiva are pink and non-injected, sclera clear OROPHARYNX:no exudate, no erythema and lips, buccal mucosa, and tongue normal  NECK: supple, thyroid normal size, non-tender, without nodularity LYMPH:  no palpable lymphadenopathy in the cervical, axillary or inguinal LUNGS: clear to auscultation and percussion with normal breathing effort HEART: regular rate & rhythm and no murmurs and no lower extremity edema ABDOMEN:abdomen soft, non-tender and normal bowel sounds Musculoskeletal:no cyanosis of digits and no clubbing  NEURO: alert & oriented x 3 with fluent speech, no focal motor/sensory deficits   LABORATORY DATA: CBC Latest Ref Rng & Units 12/07/2015 09/09/2015 06/08/2015  WBC 4.0 - 10.3 10e3/uL 5.6 4.8 4.9  Hemoglobin 13.0 - 17.1 g/dL 15.4 14.8 14.8  Hematocrit 38.4 - 49.9 % 44.9 43.3 44.2  Platelets 140 - 400 10e3/uL 240 205 220   CMP Latest Ref Rng & Units 09/09/2015 06/08/2015 03/10/2015  Glucose 70 - 140 mg/dl 144(H) 141(H) 148(H)  BUN 7.0 - 26.0 mg/dL 6.3(L) 6.9(L) 8.5  Creatinine 0.7 - 1.3 mg/dL 1.0 1.0 1.0  Sodium 136 - 145 mEq/L 140 140 141  Potassium 3.5 - 5.1 mEq/L 4.0 4.3 3.9  CO2 22 - 29 mEq/L 25 21(L) 24  Calcium 8.4 - 10.4 mg/dL  9.6 9.7 9.4  Total Protein 6.4 - 8.3 g/dL 7.6 7.8 7.6  Total Bilirubin 0.20 - 1.20 mg/dL 0.59 0.58 0.42  Alkaline Phos 40 - 150 U/L 46 47 47  AST 5 - 34 U/L  27 26 22   ALT 0 - 55 U/L 17 19 19      RADIOGRAPHIC STUDIES: RIGHT LOWER EXTREMITY VENOUS DUPLEX ULTRASOUND  (10/25/2011) Technique: Gray-scale sonography with graded compression, as well as color Doppler and duplex ultrasound, were performed to evaluate the deep venous system of the lower extremity from the level of the common femoral vein through the popliteal and proximal calf veins. Spectral Doppler was utilized to evaluate flow at rest and with distal augmentation maneuvers. Comparison: 06/02/2011 Findings: Residual echogenic intraluminal thrombus noted in the superficial femoral and popliteal veins. Partially occlusive thrombus persist at the saphenofemoral junction extending into the great saphenous vein. Slight interval decrease in thrombus burden throughout the right lower extremity deep veins and the superficial saphenous vein. No occlusive DVT demonstrated on today's study. IMPRESSION:  Residual partially occlusive right femoral popliteal DVT. Some interval improvement. Residual superficial thrombosis of the great saphenous vein, little interval change. Original Report Authenticated By: Jerilynn Mages. Daryll Brod, M.D.   ASSESSMENT: 40-yo male   PLAN:  1. 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 JAK2 mutation (-), erythropoietin normal at 3.1  --He was previously tested negative for JAK2 V617F and exon 12 mutation, his EPO level was normal.  -We previously discussed with patient that his polycythemia is likely secondary (to smoking) -I repeatedly strongly encouraged pt to quit smoking, he has cut back to half pack a day, cannot cut more. He declined tobacco cessation referral.    -We discussed that other MPN are not excluded. Bone marrow biopsy would be helpful in the diagnosis. He declined.  --He does not like phlebotomy, and was started on Hydrea. I will continue hydrea to maintain his hct less than 45% and reduce his risk of further clots including CVA, MI and DVT. -He is on Coumadin, no  need for aspirin. -His hematocrit has been under 45% most time lately, we'll continue Hydrea and monitoring.  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 -He will follow-up with his primary care physician for his PT/INR and Coumadin dose adjustment.  3. Tobacco abuse.  --Likely contributing to #1.   He is not willing to quit but has cut back. He is down to 0.5pack daily  -I strongly encouraged him to stop smoking completely. His polycythemia may resolve after quit smoking and he may not need any treatment. -We discussed lung cancer screening with CT chest again,  he declined.  Plan -continue hydrea to 539m daily except Wednesday and Saturday. -follow up coumadin clinic with his PCP Dr. LRex Kras -Repeat a CBC in 3 months,  I'll see you back in 6 months with lab.  All questions were answered. The patient knows to call the clinic with any problems, questions or concerns. We can certainly see the patient much sooner if necessary.  I spent 15 minutes counseling the patient face to face. The total time spent in the appointment was 20 minutes.  FTruitt Merle10/04/2015

## 2015-12-10 ENCOUNTER — Telehealth: Payer: Self-pay | Admitting: *Deleted

## 2015-12-10 NOTE — Telephone Encounter (Signed)
Received phone call & fax from Dr Jaquita Rector office/Jan stating that MD wanted to start pt on Vit B-12 inj but pt wanted Dr Ernestina Penna advice.  Per Dr Burr Medico, recommended oral B12 Supplement instead of B12 inj b/c B12 over replacement will make his polycythemia worse.  High MCV probably related to hydrea. Please monitor cbc every month for 1st 2-3 months.  This message was faxed to [phone].

## 2016-03-08 ENCOUNTER — Other Ambulatory Visit: Payer: Medicare Other

## 2016-05-15 ENCOUNTER — Telehealth: Payer: Self-pay | Admitting: Hematology

## 2016-05-15 NOTE — Telephone Encounter (Signed)
Called pt to inform of appt chg from 4/2 to 4/16 @ 8.45 due to md pal.  A male answered phone, belched into phone, said goodbye then hung up. I called back and got no answer. No vm set up. Mailed new appt to home address.

## 2016-06-06 ENCOUNTER — Other Ambulatory Visit: Payer: Medicare Other

## 2016-06-06 ENCOUNTER — Ambulatory Visit: Payer: Medicare Other | Admitting: Hematology

## 2016-06-13 NOTE — Progress Notes (Signed)
[facility] PROGRESS NOTE 06/20/2016   [name], MD [place] [address]  DIAGNOSIS: Polycythemia, secondary - Plan: CBC with Differential, Comprehensive metabolic panel  CURRENT THERAPY:  Phlebotomy to maintain a hemocrit less than 45% (has been held due to pt's preference). 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   INTERVAL HISTORY: Isaiah [age]IXTE54.o. male returns for follow-up of polycythemia. He is doing well today. He takes Hydrea one pill every day except for Wednesday and Saturday. He is trying to quit smoking, but is struggling. He smokes 0.5 ppd. He has had blood clots in the past. He has been bleeding very easily. Denies pain, headaches, or any other concerns.   MEDICAL HISTORY: Past Medical History:  Diagnosis Date  . HTN (hypertension)     INTERIM HISTORY: has Polycythemia, secondary; Tobacco abuse; DVT, lower extremity ([place] of lower extremity (deep venous thrombosis), unspecified laterality; and History of DVT (deep vein thrombosis) on his problem list.    ALLERGIES:  is allergic to tape.  MEDICATIONS: has a current medication list which includes the following prescription(s): amlodipine, folic acid, hydroxyurea, losartan, pravastatin, and warfarin.  SURGICAL HISTORY: No past surgical history on file.  REVIEW OF SYSTEMS:   Constitutional: Denies fevers, chills or abnormal weight loss Eyes: Denies blurriness of vision Ears, nose, mouth, throat, and face: Denies mucositis or sore throat Respiratory: Denies cough, dyspnea or wheezes Cardiovascular: Denies palpitation, chest discomfort or lower extremity swelling (+) easy bleeding  Gastrointestinal:  Denies nausea, heartburn or change in bowel habits Skin: Denies abnormal skin rashes Lymphatics: Denies new lymphadenopathy or  easy bruising Neurological:Denies numbness, tingling or new weaknesses Behavioral/Psych: Mood is stable, no new changes  All other systems were reviewed with the patient and are negative.  PHYSICAL EXAMINATION: ECOG PERFORMANCE STATUS: 0  BP 139/68 (BP Location: Left Arm, Patient Position: Sitting)   Pulse 83   Temp 98.2 F (36.8 C) (Oral)   Resp 18   Ht _0  (1.676 m)   Wt 129 lb 14.4 oz (58.9 kg)   SpO2 98%   BMI 20.97 kg/m    GENERAL:alert, no distress and comfortable elderly who appears his stated age; thin SKIN: skin color, texture, turgor are normal, no rashes or significant lesions EYES: normal, Conjunctiva are pink and non-injected, sclera clear OROPHARYNX:no exudate, no erythema and lips, buccal mucosa, and tongue normal  NECK: supple, thyroid normal size, non-tender, without nodularity LYMPH:  no palpable lymphadenopathy in the cervical, axillary or inguinal LUNGS: clear to auscultation and percussion with normal breathing effort HEART: regular rate & rhythm and no murmurs and no lower extremity edema ABDOMEN:abdomen soft, non-tender and normal bowel sounds Musculoskeletal:no cyanosis of digits and no clubbing  NEURO: alert & oriented x 3 with fluent speech, no focal motor/sensory deficits  LABORATORY DATA: CBC Latest Ref Rng & Units 06/20/2016 12/07/2015 09/09/2015  WBC 4.0 - 10.3 10e3/uL 5.8 5.6 4.8  Hemoglobin 13.0 - 17.1 g/dL 15.9 15.4 14.8  Hematocrit 38.4 - 49.9 % 46.8 44.9 43.3  Platelets 140 - 400 10e3/uL 246 240 205   CMP Latest Ref Rng & Units 06/20/2016 12/07/2015 09/09/2015  Glucose 70 - 140 mg/dl 150(H) 146(H) 144(H)  BUN 7.0 - 26.0 mg/dL 6.0(L) 9.3 6.3(L)  Creatinine 0.7 - 1.3 mg/dL 1.0 1.0 1.0  Sodium 136 - 145  mEq/L 140 142 140  Potassium 3.5 - 5.1 mEq/L 4.3 4.2 4.0  CO2 22 - 29 mEq/L _0 Calcium 8.4 - 10.4 mg/dL 9.5 9.6 9.6  Total Protein 6.4 - 8.3 g/dL 7.5 7.4 7.6  Total Bilirubin 0.20 - 1.20 mg/dL 0.73 0.41 0.59  Alkaline Phos 40 - 150 U/L  46 55 46  AST 5 - 34 U/L _1 ALT 0 - 55 U/L _2 RADIOGRAPHIC STUDIES: RIGHT LOWER EXTREMITY VENOUS DUPLEX ULTRASOUND  (10/25/2011) Technique: Gray-scale sonography with graded compression, as well as color Doppler and duplex ultrasound, were performed to evaluate the deep venous system of the lower extremity from the level of the common femoral vein through the popliteal and proximal calf veins. Spectral Doppler was utilized to evaluate flow at rest and with distal augmentation maneuvers. Comparison: 06/02/2011 Findings: Residual echogenic intraluminal thrombus noted in the superficial femoral and popliteal veins. Partially occlusive thrombus persist at the saphenofemoral junction extending into the great saphenous vein. Slight interval decrease in thrombus burden throughout the right lower extremity deep veins and the superficial saphenous vein. No occlusive DVT demonstrated on today's study. IMPRESSION:  Residual partially occlusive right femoral popliteal DVT. Some interval improvement. Residual superficial thrombosis of the great saphenous vein, little interval change. Original Report Authenticated By: Jerilynn Mages. Daryll Brod, M.D.   ASSESSMENT: 71 y.o. male   PLAN:  1. 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 JAK2 mutation (-), erythropoietin normal at 3.1  --He was previously tested negative for JAK2 V617F and exon 12 mutation, his EPO level was normal.  -We previously discussed with patient that his polycythemia is likely secondary (to smoking) -I repeatedly strongly encouraged pt to quit smoking, he has cut back to half pack a day, cannot cut more. He declined tobacco cessation referral.    -He has not had bone marrow biopsy. Due to negative JAK2, PV is much less likely, but MPN is not excluded. Bone marrow biopsy would be helpful in the diagnosis. I encourage him to think about it  --He does not like phlebotomy, and was started on Hydrea. I will continue hydrea to maintain his hct  less than 45% and reduce his risk of further clots including CVA, MI and DVT. -He is on Coumadin, no need for aspirin. -We discussed her that we do not like to give Hydrea for secondary polycythemia, due to its long-term side effects, especially MDS and leukemia. We discussed several managment options. Ideally, I would like to take him off Hydrea and for him to stop smoking. If not, I have recommended either stopping Hydrea and getting a bone marrow biopsy in a month, to see if he has MPN, or continuing Hydrea, with understanding the long term risks and side effects.  He would like to think this over and discuss with her family. -Lab review, his hematocrit has been slightly increased since we reduced his Hydrea dose, goal is to keep below 50%, I'll continue current dose of Hydrea for now.   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 -He will follow-up with his primary care physician for his PT/INR and Coumadin dose adjustment.  3. Tobacco abuse.  --Likely contributing to #1.   He is not willing to quit but has cut back. He is down to 0.5pack daily  -I again strongly encouraged him to stop smoking completely. His polycythemia may resolve after quit smoking and he may not need any  treatment. -We again discussed lung cancer screening with CT chest again,  he declined.  4. DM -Not on medication -Monitored by his PCP. I will copy Dr. Rex Kras in my note  Plan -He will think about his treatment options.  -Copy Dr. Rex Kras in my note due to his fasting blood sugar of 150.  -Return in 3 months.    All questions were answered. The patient knows to call the clinic with any problems, questions or concerns. We can certainly see the patient much sooner if necessary.  I spent 20 minutes counseling the patient face to face. The total time spent in the appointment was 25 minutes.  This document serves as a record of  services personally performed by Truitt Merle, MD. It was created on her behalf by Martinique Casey, a trained medical scribe. The creation of this record is based on the scribe's personal observations and the provider's statements to them. This document has been checked and approved by the attending provider.  I have reviewed the above documentation for accuracy and completeness and I agree with the above.  Martinique Sanda Klein 06/20/2016

## 2016-06-20 ENCOUNTER — Telehealth: Payer: Self-pay | Admitting: Hematology

## 2016-06-20 ENCOUNTER — Ambulatory Visit (HOSPITAL_BASED_OUTPATIENT_CLINIC_OR_DEPARTMENT_OTHER): Payer: Medicare Other | Admitting: Hematology

## 2016-06-20 ENCOUNTER — Other Ambulatory Visit (HOSPITAL_BASED_OUTPATIENT_CLINIC_OR_DEPARTMENT_OTHER): Payer: Medicare Other

## 2016-06-20 ENCOUNTER — Encounter: Payer: Self-pay | Admitting: Hematology

## 2016-06-20 VITALS — BP 139/68 | HR 83 | Temp 98.2°F | Resp 18 | Ht 66.0 in | Wt 129.9 lb

## 2016-06-20 DIAGNOSIS — Z720 Tobacco use: Secondary | ICD-10-CM | POA: Diagnosis not present

## 2016-06-20 DIAGNOSIS — E119 Type 2 diabetes mellitus without complications: Secondary | ICD-10-CM

## 2016-06-20 DIAGNOSIS — D751 Secondary polycythemia: Secondary | ICD-10-CM

## 2016-06-20 DIAGNOSIS — I10 Essential (primary) hypertension: Secondary | ICD-10-CM | POA: Diagnosis not present

## 2016-06-20 LAB — CBC WITH DIFFERENTIAL/PLATELET
BASO%: 1.1 % (ref 0.0–2.0)
Basophils Absolute: 0.1 10*3/uL (ref 0.0–0.1)
EOS%: 4.2 % (ref 0.0–7.0)
Eosinophils Absolute: 0.2 10*3/uL (ref 0.0–0.5)
HCT: 46.8 % (ref 38.4–49.9)
HGB: 15.9 g/dL (ref 13.0–17.1)
LYMPH%: 29.6 % (ref 14.0–49.0)
MCH: 40.1 pg — ABNORMAL HIGH (ref 27.2–33.4)
MCHC: 33.9 g/dL (ref 32.0–36.0)
MCV: 118.5 fL — ABNORMAL HIGH (ref 79.3–98.0)
MONO#: 0.6 10*3/uL (ref 0.1–0.9)
MONO%: 10.7 % (ref 0.0–14.0)
NEUT#: 3.1 10*3/uL (ref 1.5–6.5)
NEUT%: 54.4 % (ref 39.0–75.0)
PLATELETS: 246 10*3/uL (ref 140–400)
RBC: 3.95 10*6/uL — ABNORMAL LOW (ref 4.20–5.82)
RDW: 14.2 % (ref 11.0–14.6)
WBC: 5.8 10*3/uL (ref 4.0–10.3)
lymph#: 1.7 10*3/uL (ref 0.9–3.3)

## 2016-06-20 LAB — COMPREHENSIVE METABOLIC PANEL
ALBUMIN: 4.2 g/dL (ref 3.5–5.0)
ALK PHOS: 46 U/L (ref 40–150)
ALT: 14 U/L (ref 0–55)
ANION GAP: 10 meq/L (ref 3–11)
AST: 22 U/L (ref 5–34)
BILIRUBIN TOTAL: 0.73 mg/dL (ref 0.20–1.20)
BUN: 6 mg/dL — ABNORMAL LOW (ref 7.0–26.0)
CALCIUM: 9.5 mg/dL (ref 8.4–10.4)
CHLORIDE: 107 meq/L (ref 98–109)
CO2: 23 mEq/L (ref 22–29)
CREATININE: 1 mg/dL (ref 0.7–1.3)
EGFR: 72 mL/min/{1.73_m2} — ABNORMAL LOW (ref >90)
Glucose: 150 mg/dl — ABNORMAL HIGH (ref 70–140)
Potassium: 4.3 mEq/L (ref 3.5–5.1)
Sodium: 140 mEq/L (ref 136–145)
TOTAL PROTEIN: 7.5 g/dL (ref 6.4–8.3)

## 2016-06-20 NOTE — Telephone Encounter (Signed)
Appointments scheduled per 4.16.18 LOS. Patient given AVS report and calendars with future scheduled appointments. °

## 2016-08-02 ENCOUNTER — Other Ambulatory Visit: Payer: Self-pay | Admitting: *Deleted

## 2016-08-02 DIAGNOSIS — D751 Secondary polycythemia: Secondary | ICD-10-CM

## 2016-08-02 MED ORDER — HYDROXYUREA 500 MG PO CAPS: ORAL_CAPSULE | ORAL | 2 refills | Status: DC

## 2016-09-15 NOTE — Progress Notes (Signed)
[facility] OFFICE PROGRESS NOTE 09/19/2016   [name], MD [place] [address]  DIAGNOSIS: Polycythemia, secondary  Tobacco abuse  History of DVT (deep vein thrombosis)  CURRENT THERAPY:  Phlebotomy to maintain a hemocrit less than 47% (has been held due to pt's preference). Hydrea 500 mg daily started on 03/25/2013 and increased to 1,000 mg daily later, and decreased back to 538m daily due to cytopenia, now on 5050mdaily 5 days a week.   INTERVAL HISTORY: Isaiah [age]INER6.o. male returns for follow-up of polycythemia. He is doing well today. Since our last visit, he is now on 18m77moumadin which he takes on Tuesday, Thursday and Sundays. He denies any bleeding or leg swelling. He is still taking his Hydrea 5 days a week and he denies any associated issues. He has also cut back his smoking. It now takes him 3 days to smoke 1 pack compared to 2 days.    MEDICAL HISTORY: Past Medical History:  Diagnosis Date  . HTN (hypertension)     INTERIM HISTORY: has Polycythemia, secondary; Tobacco abuse; D[place], lower extremity ([place] (deep venous thrombosis), unspecified laterality; and History of DVT (deep vein thrombosis) on his problem list.    ALLERGIES:  is allergic to tape.  MEDICATIONS: has a current medication list which includes the following prescription(s): amlodipine, folic acid, hydroxyurea, losartan, pravastatin, and warfarin.  SURGICAL HISTORY: No past surgical history on file.  REVIEW OF SYSTEMS:   Constitutional: Denies fevers, chills or abnormal weight loss Eyes: Denies blurriness of vision Ears, nose, mouth, throat, and face: Denies mucositis or sore throat Respiratory: Denies cough, dyspnea or wheezes Cardiovascular: Denies palpitation, chest discomfort or lower extremity swelling  Gastrointestinal:  Denies nausea, heartburn or change in bowel habits Skin: Denies abnormal skin  rashes Lymphatics: Denies new lymphadenopathy or easy bruising Neurological:Denies numbness, tingling or new weaknesses Behavioral/Psych: Mood is stable, no new changes  All other systems were reviewed with the patient and are negative.  PHYSICAL EXAMINATION:  ECOG PERFORMANCE STATUS: 0  BP (!) 158/77 (BP Location: Left Arm, Patient Position: Sitting)   Pulse 75   Temp 97.6 F (36.4 C) (Oral)   Resp 18   Ht _0  (1.676 m)   Wt 127 lb 12.8 oz (58 kg)   SpO2 100%   BMI 20.63 kg/m    GENERAL:alert, no distress and comfortable elderly who appears his stated age; thin SKIN: skin color, texture, turgor are normal, no rashes or significant lesions EYES: normal, Conjunctiva are pink and non-injected, sclera clear OROPHARYNX:no exudate, no erythema and lips, buccal mucosa, and tongue normal  NECK: supple, thyroid normal size, non-tender, without nodularity LYMPH:  no palpable lymphadenopathy in the cervical, axillary or inguinal LUNGS: clear to auscultation and percussion with normal breathing effort HEART: regular rate & rhythm and no murmurs and no lower extremity edema ABDOMEN:abdomen soft, non-tender and normal bowel sounds Musculoskeletal:no cyanosis of digits and no clubbing  NEURO: alert & oriented x 3 with fluent speech, no focal motor/sensory deficits  LABORATORY DATA: CBC Latest Ref Rng & Units 09/19/2016 06/20/2016 12/07/2015  WBC 4.0 - 10.3 10e3/uL 6.5 5.8 5.6  Hemoglobin 13.0 - 17.1 g/dL 15.8 15.9 15.4  Hematocrit 38.4 - 49.9 % 46.6 46.8 44.9  Platelets 140 - 400 10e3/uL 199 246 240   CMP Latest Ref Rng & Units 06/20/2016 12/07/2015 09/09/2015  Glucose 70 - 140 mg/dl 150(H) 146(H) 144(H)  BUN 7.0 - 26.0  mg/dL 6.0(L) 9.3 6.3(L)  Creatinine 0.7 - 1.3 mg/dL 1.0 1.0 1.0  Sodium 136 - 145 mEq/L 140 142 140  Potassium 3.5 - 5.1 mEq/L 4.3 4.2 4.0  CO2 22 - 29 mEq/L _0 Calcium 8.4 - 10.4 mg/dL 9.5 9.6 9.6  Total Protein 6.4 - 8.3 g/dL 7.5 7.4 7.6  Total Bilirubin 0.20 -  1.20 mg/dL 0.73 0.41 0.59  Alkaline Phos 40 - 150 U/L 46 55 46  AST 5 - 34 U/L _1 ALT 0 - 55 U/L _2 RADIOGRAPHIC STUDIES: RIGHT LOWER EXTREMITY VENOUS DUPLEX ULTRASOUND  (10/25/2011) Technique: Gray-scale sonography with graded compression, as well as color Doppler and duplex ultrasound, were performed to evaluate the deep venous system of the lower extremity from the level of the common femoral vein through the popliteal and proximal calf veins. Spectral Doppler was utilized to evaluate flow at rest and with distal augmentation maneuvers. Comparison: 06/02/2011 Findings: Residual echogenic intraluminal thrombus noted in the superficial femoral and popliteal veins. Partially occlusive thrombus persist at the saphenofemoral junction extending into the great saphenous vein. Slight interval decrease in thrombus burden throughout the right lower extremity deep veins and the superficial saphenous vein. No occlusive DVT demonstrated on today's study. IMPRESSION:  Residual partially occlusive right femoral popliteal DVT. Some interval improvement. Residual superficial thrombosis of the great saphenous vein, little interval change. Original Report Authenticated By: Jerilynn Mages. Daryll Brod, M.D.   ASSESSMENT: 71 y.o. male   PLAN:  1. 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 JAK2 mutation (-), erythropoietin normal at 3.1  --He was previously tested negative for JAK2 V617F and exon 12 mutation, his EPO level was normal.  -We previously discussed with patient that his polycythemia is likely secondary (to smoking) -I repeatedly strongly encouraged pt to quit smoking, he has cut back to half pack a day, cannot cut more. He declined tobacco cessation referral.    -He has not had bone marrow biopsy. Due to negative JAK2, PV is much less likely, but MPN is not excluded. Bone marrow biopsy would be helpful in the diagnosis. I encourage him to think about it  --He does not like phlebotomy, and was started  on Hydrea. I will continue hydrea to maintain his hct less than 47% and reduce his risk of further clots including CVA, MI and DVT. -We previously discussed her that we do not like to give Hydrea for secondary polycythemia, due to its long-term side effects, especially MDS and leukemia. We discussed several managment options. Ideally, I would like to take him off Hydrea and for him to stop smoking. If not, I have recommended either stopping Hydrea and getting a bone marrow biopsy in a month, to see if he has MPN, or continuing Hydrea, with understanding the long term risks and side effects. He has decided to continue Hydrea for now. -Lab review, his hematocrit 46.4 today, goal is to keep below 47%, I'll continue current dose of Hydrea for now.  - He has cut back his smoking, and hopefully I can take him off hydrea after e quit smoking completely -He is on Coumadin   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 -He will follow-up with his primary care physician for his PT/INR and Coumadin dose adjustment.  3. Tobacco abuse.  --Likely contributing to #1.   He is not willing to quit but has cut back. He is down to 1 pack  for 3 days -I again strongly encouraged him to stop smoking completely. His polycythemia may resolve after quit smoking and he may not need any treatment. -We again discussed lung cancer screening with CT chest again,  he declined. - he has cut back on smoking - I strongly encoruaged him to try to stop completely. He wants to avoid having to use oxygen in the future  4. DM -Not on medication -Monitored by his PCP Dr. Rex Kras    Plan -He is cutting back his smoking. -repeat lab in 2 months and 4 months with f/u -He will continue Hydrea at current dose.  All questions were answered. The patient knows to call the clinic with any problems, questions or concerns. We can certainly see the patient  much sooner if necessary.  I spent 20 minutes counseling the patient face to face. The total time spent in the appointment was 25 minutes. This document serves as a record of services personally performed by Truitt Merle, MD. It was created on her behalf by Brandt Loosen, a trained medical scribe. The creation of this record is based on the scribe's personal observations and the provider's statements to them. This document has been checked and approved by the attending provider.  I have reviewed the above documentation for accuracy and completeness and I agree with the above.  Truitt Merle 09/19/2016

## 2016-09-19 ENCOUNTER — Other Ambulatory Visit (HOSPITAL_BASED_OUTPATIENT_CLINIC_OR_DEPARTMENT_OTHER): Payer: Medicare Other

## 2016-09-19 ENCOUNTER — Telehealth: Payer: Self-pay | Admitting: Hematology

## 2016-09-19 ENCOUNTER — Ambulatory Visit (HOSPITAL_BASED_OUTPATIENT_CLINIC_OR_DEPARTMENT_OTHER): Payer: Medicare Other | Admitting: Hematology

## 2016-09-19 ENCOUNTER — Encounter: Payer: Self-pay | Admitting: Hematology

## 2016-09-19 VITALS — BP 158/77 | HR 75 | Temp 97.6°F | Resp 18 | Ht 66.0 in | Wt 127.8 lb

## 2016-09-19 DIAGNOSIS — E119 Type 2 diabetes mellitus without complications: Secondary | ICD-10-CM | POA: Diagnosis not present

## 2016-09-19 DIAGNOSIS — Z720 Tobacco use: Secondary | ICD-10-CM

## 2016-09-19 DIAGNOSIS — D751 Secondary polycythemia: Secondary | ICD-10-CM

## 2016-09-19 DIAGNOSIS — Z86718 Personal history of other venous thrombosis and embolism: Secondary | ICD-10-CM

## 2016-09-19 LAB — CBC WITH DIFFERENTIAL/PLATELET
BASO%: 0.9 % (ref 0.0–2.0)
Basophils Absolute: 0.1 10*3/uL (ref 0.0–0.1)
EOS ABS: 0.4 10*3/uL (ref 0.0–0.5)
EOS%: 6.8 % (ref 0.0–7.0)
HCT: 46.6 % (ref 38.4–49.9)
HEMOGLOBIN: 15.8 g/dL (ref 13.0–17.1)
LYMPH%: 34 % (ref 14.0–49.0)
MCH: 40.2 pg — ABNORMAL HIGH (ref 27.2–33.4)
MCHC: 33.9 g/dL (ref 32.0–36.0)
MCV: 118.6 fL — AB (ref 79.3–98.0)
MONO#: 0.8 10*3/uL (ref 0.1–0.9)
MONO%: 12.6 % (ref 0.0–14.0)
NEUT%: 45.7 % (ref 39.0–75.0)
NEUTROS ABS: 3 10*3/uL (ref 1.5–6.5)
Platelets: 199 10*3/uL (ref 140–400)
RBC: 3.93 10*6/uL — ABNORMAL LOW (ref 4.20–5.82)
RDW: 13.9 % (ref 11.0–14.6)
WBC: 6.5 10*3/uL (ref 4.0–10.3)
lymph#: 2.2 10*3/uL (ref 0.9–3.3)

## 2016-09-19 NOTE — Telephone Encounter (Signed)
Scheduled appt per 7/16 los - Gave patient AVS and calender per los.  

## 2016-10-10 ENCOUNTER — Other Ambulatory Visit: Payer: Self-pay | Admitting: Hematology

## 2016-10-10 DIAGNOSIS — D751 Secondary polycythemia: Secondary | ICD-10-CM

## 2016-11-21 ENCOUNTER — Other Ambulatory Visit: Payer: Medicare Other

## 2016-11-25 ENCOUNTER — Other Ambulatory Visit (HOSPITAL_BASED_OUTPATIENT_CLINIC_OR_DEPARTMENT_OTHER): Payer: Medicare Other

## 2016-11-25 DIAGNOSIS — D751 Secondary polycythemia: Secondary | ICD-10-CM | POA: Diagnosis not present

## 2016-11-25 LAB — CBC WITH DIFFERENTIAL/PLATELET
BASO%: 1 % (ref 0.0–2.0)
Basophils Absolute: 0.1 10*3/uL (ref 0.0–0.1)
EOS%: 4.8 % (ref 0.0–7.0)
Eosinophils Absolute: 0.3 10*3/uL (ref 0.0–0.5)
HEMATOCRIT: 46.3 % (ref 38.4–49.9)
HGB: 15.7 g/dL (ref 13.0–17.1)
LYMPH#: 1.9 10*3/uL (ref 0.9–3.3)
LYMPH%: 29.6 % (ref 14.0–49.0)
MCH: 40.4 pg — ABNORMAL HIGH (ref 27.2–33.4)
MCHC: 33.9 g/dL (ref 32.0–36.0)
MCV: 119.2 fL — ABNORMAL HIGH (ref 79.3–98.0)
MONO#: 0.7 10*3/uL (ref 0.1–0.9)
MONO%: 11 % (ref 0.0–14.0)
NEUT%: 53.6 % (ref 39.0–75.0)
NEUTROS ABS: 3.5 10*3/uL (ref 1.5–6.5)
Platelets: 230 10*3/uL (ref 140–400)
RBC: 3.89 10*6/uL — AB (ref 4.20–5.82)
RDW: 14.6 % (ref 11.0–14.6)
WBC: 6.5 10*3/uL (ref 4.0–10.3)

## 2016-12-16 ENCOUNTER — Other Ambulatory Visit: Payer: Self-pay | Admitting: Hematology

## 2016-12-16 DIAGNOSIS — D751 Secondary polycythemia: Secondary | ICD-10-CM

## 2017-01-19 NOTE — Progress Notes (Signed)
[facility]EMATOLOGY OFFICE PROGRESS NOTE 01/20/2017   Isaiah York, Isaiah York [address]  DIAGNOSIS: Polycythemia, secondary - Plan: hydroxyurea (HYDREA) 500 MG capsule  History of DVT (deep vein thrombosis)  CURRENT THERAPY:  Phlebotomy to maintain a hemocrit less than 47% (has been held due to pt's preference). 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   INTERVAL HISTORY:  Isaiah [age]ONE55.o. male returns for follow-up of polycythemia. He presents to the clinic today noting nothing new has occurred since last visit. He reduced smoking down to 5 cigarettes a day. He tried smoking patch but had allergic reaction to adhesive material. He stopped using it. He is on Chantix and this is helping him. He notes chest congestion recently. This is manageable and knows its related to his smoking. Overall he is doing well.    MEDICAL HISTORY: Past Medical History:  Diagnosis Date  . HTN (hypertension)     INTERIM HISTORY: has Polycythemia, secondary; Tobacco abuse; DVT, lower extremity ([facility] lower [facility] (deep venous thrombosis), unspecified laterality; and History of DVT (deep vein thrombosis) on their problem list.    ALLERGIES:  is allergic to tape.  MEDICATIONS: has a current medication list which includes the following prescription(s): amlodipine, folic acid, hydroxyurea, losartan, pravastatin, and warfarin.  SURGICAL HISTORY: History reviewed. No pertinent surgical history.  REVIEW OF SYSTEMS:   Constitutional: Denies fevers, chills or abnormal weight loss Eyes: Denies blurriness of vision Ears, nose, mouth, throat, and face: Denies mucositis or sore throat (+) chest congestion  Respiratory: Denies cough, dyspnea or wheezes  Cardiovascular: Denies palpitation, chest discomfort or lower extremity swelling   Gastrointestinal:  Denies nausea, heartburn or change in bowel habits Skin: Denies abnormal skin rashes Lymphatics: Denies new lymphadenopathy or easy bruising Neurological:Denies numbness, tingling or new weaknesses Behavioral/Psych: Mood is stable, no new changes  All other systems were reviewed with the patient and are negative.  PHYSICAL EXAMINATION:  ECOG PERFORMANCE STATUS: 0  BP (!) 144/75 (BP Location: Left Arm, Patient Position: Sitting)   Pulse 97   Temp (!) 97.5 F (36.4 C) (Oral)   Resp 18   Ht _0  (1.676 m)   Wt 128 lb 3.2 oz (58.2 kg)   SpO2 100%   BMI 20.69 kg/m    GENERAL:alert, no distress and comfortable elderly who appears his stated age; thin SKIN: skin color, texture, turgor are normal, no rashes or significant lesions EYES: normal, Conjunctiva are pink and non-injected, sclera clear OROPHARYNX:no exudate, no erythema and lips, buccal mucosa, and tongue normal  NECK: supple, thyroid normal size, non-tender, without nodularity LYMPH:  no palpable lymphadenopathy in the cervical, axillary or inguinal LUNGS: clear to auscultation and percussion with normal breathing effort HEART: regular rate & rhythm and no murmurs and no lower extremity edema ABDOMEN:abdomen soft, non-tender and normal bowel sounds Musculoskeletal:no cyanosis of digits and no clubbing  NEURO: alert & oriented x 3 with fluent speech, no focal motor/sensory deficits  LABORATORY DATA: CBC Latest Ref Rng & Units 01/20/2017 11/25/2016 09/19/2016  WBC 4.0 - 10.3 10e3/uL 6.5 6.5 6.5  Hemoglobin 13.0 - 17.1 g/dL 15.6 15.7 15.8  Hematocrit 38.4 - 49.9 % 46.5 46.3 46.6  Platelets 140 - 400 10e3/uL 311 230 199   CMP Latest Ref Rng & Units 01/20/2017 06/20/2016 12/07/2015  Glucose 70 -  140 mg/dl 129 150(H) 146(H)  BUN 7.0 - 26.0 mg/dL 9.7 6.0(L) 9.3  Creatinine 0.7 - 1.3 mg/dL 1.1 1.0 1.0  Sodium 136 - 145 mEq/L 138 140 142  Potassium 3.5 - 5.1 mEq/L 4.0 4.3 4.2  CO2 22 - 29 mEq/L _0 Calcium 8.4 - 10.4 mg/dL 9.6 9.5 9.6  Total Protein 6.4 - 8.3 g/dL 7.5 7.5 7.4  Total Bilirubin 0.20 - 1.20 mg/dL 0.84 0.73 0.41  Alkaline Phos 40 - 150 U/L 48 46 55  AST 5 - 34 U/L _1 ALT 0 - 55 U/L _2 RADIOGRAPHIC STUDIES: RIGHT LOWER EXTREMITY VENOUS DUPLEX ULTRASOUND  (10/25/2011) Technique: Gray-scale sonography with graded compression, as well as color Doppler and duplex ultrasound, were performed to evaluate the deep venous system of the lower extremity from the level of the common femoral vein through the popliteal and proximal calf veins. Spectral Doppler was utilized to evaluate flow at rest and with distal augmentation maneuvers. Comparison: 06/02/2011 Findings: Residual echogenic intraluminal thrombus noted in the superficial femoral and popliteal veins. Partially occlusive thrombus persist at the saphenofemoral junction extending into the great saphenous vein. Slight interval decrease in thrombus burden throughout the right lower extremity deep veins and the superficial saphenous vein. No occlusive DVT demonstrated on today's study. IMPRESSION:  Residual partially occlusive right femoral popliteal DVT. Some interval improvement. Residual superficial thrombosis of the great saphenous vein, little interval change. Original Report Authenticated By: Jerilynn Mages. Daryll Brod, M.D.   ASSESSMENT: 71 y.o. male   PLAN:  1. 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 JAK2 mutation (-), erythropoietin normal at 3.1  --He was previously tested negative for JAK2 V617F and exon 12 mutation, his EPO level was normal.  -We previously discussed with patient that his polycythemia is likely secondary (to smoking) -I repeatedly strongly encouraged pt to quit smoking, he has cut back to half pack a day, cannot cut more. He declined tobacco cessation referral.    -He has not had bone marrow biopsy. Due to negative JAK2, PV is much less likely, but MPN is not excluded. Bone marrow biopsy would be  helpful in the diagnosis. I encourage him to think about it  --He does not like phlebotomy, and was started on Hydrea. I will continue hydrea to maintain his hct less than 47% and reduce his risk of further clots including CVA, MI and DVT. -We previously discussed that we do not like to give Hydrea for secondary polycythemia, due to its long-term side effects, especially MDS and leukemia. We previously discussed several managment options. Ideally, I would like to take him off Hydrea and for him to stop smoking. If not, I have recommended either stopping Hydrea and getting a bone marrow biopsy in a month, to see if he has MPN, or continuing Hydrea, with understanding the long term risks and side effects. He has decided to continue Hydrea for now. -I discussed his hematocrit levels are related to his smoking. He has reduced and I encouraged to continue with quitting.  -labs reviewed, HCT is 46.5 today, goal is to keep below 47%, I'll continue current dose of Hydrea for now.  -I discussed the possibility of wean him off hydrea to watch his counts after he stops smoking. If possible we may stop Hydrea.  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 -He  will follow-up with his primary care physician for his PT/INR and Coumadin dose adjustment. -He will continue Coumadin.   3. Tobacco abuse.  --Likely contributing to #1.   He is not willing to quit but has cut back. He is down to 1 pack for 3 days -I again strongly encouraged him to stop smoking completely. His polycythemia may resolve after quit smoking and he may not need any treatment. -We again discussed lung cancer screening with CT chest again,  he declined. - I strongly encouraged him to try to stop completely. He wants to avoid having to use oxygen in the future -He tried smoking path but had allergic reaction to  adhesive material. He stopped using.  -He does not chest congestion which is related to his smoking.  -He reduced down to 5 cigarettes a day, he takes Chantix once a day now which is helping him quitting -I encouraged him to completely stop smoking in the next 2-3 months   4. DM -Not on medication -Monitored by his PCP Dr. Rex Kras    Plan -He is cutting back his smoking, may be able to quit completely in the next 2-3 months -repeat lab in 2 months and 4 months with f/u -He will continue Hydrea at current dose. Refilled Hydrea today.  If he quit smoking completely, I will will decrease his Hydrea dose, and possible stop it in the future   All questions were answered. The patient knows to call the clinic with any problems, questions or concerns. We can certainly see the patient much sooner if necessary.  I spent 15 minutes counseling the patient face to face. The total time spent in the appointment was 20 minutes.   This document serves as a record of services personally performed by Truitt Merle, MD. It was created on her behalf by Joslyn Devon, a trained medical scribe. The creation of this record is based on the scribe's personal observations and the provider's statements to them.    I have reviewed the above documentation for accuracy and completeness, and I agree with the above.  Truitt Merle 01/20/2017

## 2017-01-20 ENCOUNTER — Ambulatory Visit (HOSPITAL_BASED_OUTPATIENT_CLINIC_OR_DEPARTMENT_OTHER): Payer: Medicare Other | Admitting: Hematology

## 2017-01-20 ENCOUNTER — Encounter: Payer: Self-pay | Admitting: Hematology

## 2017-01-20 ENCOUNTER — Other Ambulatory Visit (HOSPITAL_BASED_OUTPATIENT_CLINIC_OR_DEPARTMENT_OTHER): Payer: Medicare Other

## 2017-01-20 ENCOUNTER — Telehealth: Payer: Self-pay | Admitting: Hematology

## 2017-01-20 VITALS — BP 144/75 | HR 97 | Temp 97.5°F | Resp 18 | Ht 66.0 in | Wt 128.2 lb

## 2017-01-20 DIAGNOSIS — D751 Secondary polycythemia: Secondary | ICD-10-CM | POA: Diagnosis not present

## 2017-01-20 DIAGNOSIS — I10 Essential (primary) hypertension: Secondary | ICD-10-CM | POA: Diagnosis not present

## 2017-01-20 DIAGNOSIS — Z720 Tobacco use: Secondary | ICD-10-CM

## 2017-01-20 DIAGNOSIS — Z86718 Personal history of other venous thrombosis and embolism: Secondary | ICD-10-CM | POA: Diagnosis not present

## 2017-01-20 LAB — CBC WITH DIFFERENTIAL/PLATELET
BASO%: 1.4 % (ref 0.0–2.0)
Basophils Absolute: 0.1 10*3/uL (ref 0.0–0.1)
EOS ABS: 0.3 10*3/uL (ref 0.0–0.5)
EOS%: 4.4 % (ref 0.0–7.0)
HCT: 46.5 % (ref 38.4–49.9)
HEMOGLOBIN: 15.6 g/dL (ref 13.0–17.1)
LYMPH%: 25.8 % (ref 14.0–49.0)
MCH: 39.9 pg — ABNORMAL HIGH (ref 27.2–33.4)
MCHC: 33.6 g/dL (ref 32.0–36.0)
MCV: 118.6 fL — AB (ref 79.3–98.0)
MONO#: 0.7 10*3/uL (ref 0.1–0.9)
MONO%: 10.1 % (ref 0.0–14.0)
NEUT%: 58.3 % (ref 39.0–75.0)
NEUTROS ABS: 3.8 10*3/uL (ref 1.5–6.5)
PLATELETS: 311 10*3/uL (ref 140–400)
RBC: 3.92 10*6/uL — AB (ref 4.20–5.82)
RDW: 14.7 % — AB (ref 11.0–14.6)
WBC: 6.5 10*3/uL (ref 4.0–10.3)
lymph#: 1.7 10*3/uL (ref 0.9–3.3)

## 2017-01-20 LAB — COMPREHENSIVE METABOLIC PANEL
ALBUMIN: 4 g/dL (ref 3.5–5.0)
ALK PHOS: 48 U/L (ref 40–150)
ALT: 13 U/L (ref 0–55)
AST: 21 U/L (ref 5–34)
Anion Gap: 11 mEq/L (ref 3–11)
BILIRUBIN TOTAL: 0.84 mg/dL (ref 0.20–1.20)
BUN: 9.7 mg/dL (ref 7.0–26.0)
CO2: 22 meq/L (ref 22–29)
CREATININE: 1.1 mg/dL (ref 0.7–1.3)
Calcium: 9.6 mg/dL (ref 8.4–10.4)
Chloride: 105 mEq/L (ref 98–109)
EGFR: >60 mL/min/{1.73_m2} (ref >60)
GLUCOSE: 129 mg/dL (ref 70–140)
Potassium: 4 mEq/L (ref 3.5–5.1)
SODIUM: 138 meq/L (ref 136–145)
TOTAL PROTEIN: 7.5 g/dL (ref 6.4–8.3)

## 2017-01-20 MED ORDER — HYDROXYUREA 500 MG PO CAPS
ORAL_CAPSULE | ORAL | 3 refills | Status: DC
Start: 2017-01-20 — End: 2017-02-22

## 2017-01-20 NOTE — Telephone Encounter (Signed)
Gave avs and calendar for January and March 2019 °

## 2017-02-21 ENCOUNTER — Other Ambulatory Visit: Payer: Self-pay | Admitting: Hematology

## 2017-02-21 DIAGNOSIS — D751 Secondary polycythemia: Secondary | ICD-10-CM

## 2017-02-22 ENCOUNTER — Other Ambulatory Visit: Payer: Self-pay | Admitting: *Deleted

## 2017-02-22 DIAGNOSIS — D751 Secondary polycythemia: Secondary | ICD-10-CM

## 2017-02-22 MED ORDER — HYDROXYUREA 500 MG PO CAPS: ORAL_CAPSULE | ORAL | 1 refills | Status: DC

## 2017-03-22 ENCOUNTER — Inpatient Hospital Stay: Payer: Medicare Other | Attending: Hematology

## 2017-03-22 DIAGNOSIS — D751 Secondary polycythemia: Secondary | ICD-10-CM | POA: Insufficient documentation

## 2017-03-22 LAB — CBC WITH DIFFERENTIAL/PLATELET
Basophils Absolute: 0.1 10*3/uL (ref 0.0–0.1)
Basophils Relative: 1 %
EOS ABS: 0.3 10*3/uL (ref 0.0–0.5)
Eosinophils Relative: 5 %
HEMATOCRIT: 44.6 % (ref 38.4–49.9)
HEMOGLOBIN: 15 g/dL (ref 13.0–17.1)
LYMPHS ABS: 2 10*3/uL (ref 0.9–3.3)
LYMPHS PCT: 34 %
MCH: 39.4 pg — AB (ref 27.2–33.4)
MCHC: 33.6 g/dL (ref 32.0–36.0)
MCV: 117.1 fL — AB (ref 79.3–98.0)
MONOS PCT: 11 %
Monocytes Absolute: 0.7 10*3/uL (ref 0.1–0.9)
NEUTROS ABS: 2.8 10*3/uL (ref 1.5–6.5)
NEUTROS PCT: 49 %
Platelets: 213 10*3/uL (ref 140–400)
RBC: 3.81 MIL/uL — AB (ref 4.20–5.82)
RDW: 13.9 % (ref 11.0–15.6)
WBC: 5.7 10*3/uL (ref 4.0–10.3)

## 2017-05-17 NOTE — Progress Notes (Signed)
[facility] OFFICE PROGRESS NOTE 05/19/2017   Isaiah York, [place] [address]  DIAGNOSIS: Polycythemia, secondary  CURRENT THERAPY:  Phlebotomy to maintain a hemocrit less than 47% (has been held due to pt's preference). Hydrea 500 mg daily started on 03/25/2013 and increased to 1,000 mg daily later, and decreased back to 535m daily due to cytopenia, now on 5024mdaily 5 days a week. Stopped on 05/19/17  INTERVAL HISTORY:  Isaiah MANZO89.o. male returns for follow-up of polycythemia. He presents to the clinic today by himself. He reports he is doing well other than a mild cold for one week. He notes to have a productive cough with some yellow sputum.   He reports he was down to 3 cigarettes a day but now he is up to 6 a day due to the holidays. He notes that his wife is trying to cut down with him. He is compliant with Hydrea.  On review of systems, pt denies fever, chills, congestion or any other complaints at this time. Pertinent positives are listed and detailed within the above HPI.   MEDICAL HISTORY: Past Medical History:  Diagnosis Date  . HTN (hypertension)     INTERIM HISTORY: has Polycythemia, secondary; Tobacco abuse; DVT, lower extremity ([facility] [place]VT of lower extremity (deep venous thrombosis), unspecified laterality; and History of DVT (deep vein thrombosis) on their problem list.    ALLERGIES:  is allergic to tape.  MEDICATIONS: has a current medication list which includes the following prescription(s): amlodipine, folic acid, hydroxyurea, losartan, pravastatin, and warfarin.  SURGICAL HISTORY: History reviewed. No pertinent surgical history.  REVIEW OF SYSTEMS:   Constitutional: Denies fevers, chills or abnormal weight loss Eyes: Denies blurriness of vision Ears, nose, mouth, throat, and face: Denies mucositis or sore throat (+) cough Respiratory: Denies cough, dyspnea or wheezes  Cardiovascular: Denies  palpitation, chest discomfort or lower extremity swelling  Gastrointestinal:  Denies nausea, heartburn or change in bowel habits Skin: Denies abnormal skin rashes Lymphatics: Denies new lymphadenopathy or easy bruising Neurological:Denies numbness, tingling or new weaknesses Behavioral/Psych: Mood is stable, no new changes  All other systems were reviewed with the patient and are negative.  PHYSICAL EXAMINATION:  ECOG PERFORMANCE STATUS: 1  BP 123/64 (BP Location: Right Arm, Patient Position: Sitting)   Pulse 82   Temp 97.9 F (36.6 C) (Oral)   Resp 15   Wt 127 lb 1.6 oz (57.7 kg)   SpO2 98%   BMI 20.51 kg/m    GENERAL:alert, no distress and comfortable elderly who appears his stated age; thin SKIN: skin color, texture, turgor are normal, no rashes or significant lesions EYES: normal, Conjunctiva are pink and non-injected, sclera clear OROPHARYNX:no exudate, no erythema and lips, buccal mucosa, and tongue normal  NECK: supple, thyroid normal size, non-tender, without nodularity LYMPH:  no palpable lymphadenopathy in the cervical, axillary or inguinal LUNGS: (+) Diminished breath sounds bilaterally. No rales or wheezing.  HEART: regular rate & rhythm and no murmurs and no lower extremity edema ABDOMEN:abdomen soft, non-tender and normal bowel sounds Musculoskeletal:no cyanosis of digits and no clubbing  NEURO: alert & oriented x 3 with fluent speech, no focal motor/sensory deficits  LABORATORY DATA: CBC Latest Ref Rng & Units 05/19/2017 03/22/2017 01/20/2017  WBC 4.0 - 10.3 K/uL 6.0 5.7 6.5  Hemoglobin 13.0 - 17.1 g/dL 16.1 15.0 15.6  Hematocrit 38.4 - 49.9 % 48.4 44.6 46.5  Platelets 140 - 400 K/uL 164 213 311  CMP Latest Ref Rng & Units 05/19/2017 01/20/2017 06/20/2016  Glucose 70 - 140 mg/dL 101 129 150(H)  BUN 7 - 26 mg/dL 10 9.7 6.0(L)  Creatinine 0.70 - 1.30 mg/dL 1.00 1.1 1.0  Sodium 136 - 145 mmol/L 137 138 140  Potassium 3.5 - 5.1 mmol/L 4.2 4.0 4.3  Chloride 98 -  109 mmol/L 104 - -  CO2 22 - 29 mmol/L 21(L) 22 23  Calcium 8.4 - 10.4 mg/dL 9.3 9.6 9.5  Total Protein 6.4 - 8.3 g/dL 7.3 7.5 7.5  Total Bilirubin 0.2 - 1.2 mg/dL 0.6 0.84 0.73  Alkaline Phos 40 - 150 U/L 41 48 46  AST 5 - 34 U/L 27 21 22   ALT 0 - 55 U/L 19 13 14    RADIOGRAPHIC STUDIES: RIGHT LOWER EXTREMITY VENOUS DUPLEX ULTRASOUND  (10/25/2011) Technique: Gray-scale sonography with graded compression, as well as color Doppler and duplex ultrasound, were performed to evaluate the deep venous system of the lower extremity from the level of the common femoral vein through the popliteal and proximal calf veins. Spectral Doppler was utilized to evaluate flow at rest and with distal augmentation maneuvers. Comparison: 06/02/2011 Findings: Residual echogenic intraluminal thrombus noted in the superficial femoral and popliteal veins. Partially occlusive thrombus persist at the saphenofemoral junction extending into the great saphenous vein. Slight interval decrease in thrombus burden throughout the right lower extremity deep veins and the superficial saphenous vein. No occlusive DVT demonstrated on today's study. IMPRESSION:  Residual partially occlusive right femoral popliteal DVT. Some interval improvement. Residual superficial thrombosis of the great saphenous vein, little interval change. Original Report Authenticated By: Jerilynn Mages. Daryll Brod, M.D.   ASSESSMENT: 72 y.o. male   PLAN:  1. 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 JAK2 mutation (-), erythropoietin normal at 3.1  --He was previously tested negative for JAK2 V617F and exon 12 mutation, his EPO level was normal.  -We previously discussed with patient that his polycythemia is likely secondary (to smoking) -I repeatedly strongly encouraged pt to quit smoking, he has cut back to half pack a day, cannot cut more. He declined tobacco cessation referral.    -He has not had bone marrow biopsy. Due to negative JAK2, PV is much less likely, but  MPN is not excluded. Bone marrow biopsy would be helpful in the diagnosis. I encourage him to think about it  --He does not like phlebotomy, and was started on Hydrea. I will continue hydrea to maintain his hct less than 47% and reduce his risk of further clots including CVA, MI and DVT. -We previously discussed that we do not like to give Hydrea for secondary polycythemia, due to its long-term side effects, especially MDS and leukemia. We previously discussed several managment options. Ideally, I would like to take him off Hydrea and for him to stop smoking. If not, I have recommended either stopping Hydrea and getting a bone marrow biopsy in a month, to see if he has MPN, or continuing Hydrea, with understanding the long term risks and side effects. He has decided to continue Hydrea for now. -I previously discussed his hematocrit levels are related to his smoking. He has reduced and I encouraged to continue with quitting.  -I previously discussed the possibility of wean him off hydrea to watch his counts after he stops smoking. If possible we may stop Hydrea.  -labs reviewed, HCT is 48.4%, goal is to keep below 50% -He has cut his smoking significantly, he can stop hydrea for now and he will return in  3 weeks for lab and f/u with lab in 6 weeks. I will monitor his count closely and if his polycythemia gets much worse, may consider restart Hydrea. Pt is agreeable with this plan and has motive to quit smoking so he can stay off Hydrea.   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 -He will follow-up with his primary care physician for his PT/INR and Coumadin dose adjustment. -He will continue Coumadin.   3. Tobacco abuse.  -Likely contributing to #1.   He is not willing to quit but has cut back. He is down to 1 pack for 3 days -I again strongly encouraged him to stop smoking completely. His polycythemia may  resolve after quit smoking and he may not need any treatment. -We again discussed lung cancer screening with CT chest again,  he declined. - I strongly encouraged him to try to stop completely. He wants to avoid having to use oxygen in the future -He tried smoking patch but had allergic reaction to adhesive material. He stopped using.  -He does have chest congestion which is related to his smoking.  -He reduced down to 5 cigarettes a day, he takes Chantix once a day now which is helping him quitting -He currently smokes 6 cigarettes per day now -I encouraged him to completely stop smoking in the next 2-3 months   4. DM -Not on medication -Monitored by his PCP Dr. Rex Kras   5. Acute Bronchitis  -I prescribed Augmentin for him to take BID for 5 days. He has diminished breath sounds bilaterally and complaints of a productive cough   Plan -Stop hydrea for now, return for labs in 3 weeks -Lab and f/u in 6 weeks -Continue Coumadin -Prescribed Augmentin today   All questions were answered. The patient knows to call the clinic with any problems, questions or concerns. We can certainly see the patient much sooner if necessary.  I spent 15 minutes counseling the patient face to face. The total time spent in the appointment was 20 minutes.  This document serves as a record of services personally performed by Truitt Merle, MD. It was created on her behalf by Theresia Bough, a trained medical scribe. The creation of this record is based on the scribe's personal observations and the provider's statements to them.   I have reviewed the above documentation for accuracy and completeness, and I agree with the above.   Truitt Merle 05/19/2017

## 2017-05-19 ENCOUNTER — Telehealth: Payer: Self-pay | Admitting: Hematology

## 2017-05-19 ENCOUNTER — Inpatient Hospital Stay: Payer: Medicare Other | Attending: Hematology | Admitting: Hematology

## 2017-05-19 ENCOUNTER — Encounter: Payer: Self-pay | Admitting: Hematology

## 2017-05-19 ENCOUNTER — Inpatient Hospital Stay: Payer: Medicare Other

## 2017-05-19 VITALS — BP 123/64 | HR 82 | Temp 97.9°F | Resp 15 | Wt 127.1 lb

## 2017-05-19 DIAGNOSIS — F17210 Nicotine dependence, cigarettes, uncomplicated: Secondary | ICD-10-CM | POA: Insufficient documentation

## 2017-05-19 DIAGNOSIS — I10 Essential (primary) hypertension: Secondary | ICD-10-CM | POA: Insufficient documentation

## 2017-05-19 DIAGNOSIS — Z86718 Personal history of other venous thrombosis and embolism: Secondary | ICD-10-CM | POA: Diagnosis not present

## 2017-05-19 DIAGNOSIS — Z888 Allergy status to other drugs, medicaments and biological substances status: Secondary | ICD-10-CM | POA: Insufficient documentation

## 2017-05-19 DIAGNOSIS — Z79899 Other long term (current) drug therapy: Secondary | ICD-10-CM | POA: Diagnosis not present

## 2017-05-19 DIAGNOSIS — Z720 Tobacco use: Secondary | ICD-10-CM

## 2017-05-19 DIAGNOSIS — E119 Type 2 diabetes mellitus without complications: Secondary | ICD-10-CM | POA: Diagnosis not present

## 2017-05-19 DIAGNOSIS — D751 Secondary polycythemia: Secondary | ICD-10-CM

## 2017-05-19 DIAGNOSIS — Z7901 Long term (current) use of anticoagulants: Secondary | ICD-10-CM | POA: Diagnosis not present

## 2017-05-19 DIAGNOSIS — J209 Acute bronchitis, unspecified: Secondary | ICD-10-CM | POA: Diagnosis not present

## 2017-05-19 LAB — COMPREHENSIVE METABOLIC PANEL
ALK PHOS: 41 U/L (ref 40–150)
ALT: 19 U/L (ref 0–55)
AST: 27 U/L (ref 5–34)
Albumin: 3.8 g/dL (ref 3.5–5.0)
Anion gap: 12 — ABNORMAL HIGH (ref 3–11)
BILIRUBIN TOTAL: 0.6 mg/dL (ref 0.2–1.2)
BUN: 10 mg/dL (ref 7–26)
CALCIUM: 9.3 mg/dL (ref 8.4–10.4)
CO2: 21 mmol/L — ABNORMAL LOW (ref 22–29)
Chloride: 104 mmol/L (ref 98–109)
Creatinine, Ser: 1 mg/dL (ref 0.70–1.30)
GFR calc Af Amer: >60 mL/min (ref >60)
Glucose, Bld: 101 mg/dL (ref 70–140)
POTASSIUM: 4.2 mmol/L (ref 3.5–5.1)
Sodium: 137 mmol/L (ref 136–145)
TOTAL PROTEIN: 7.3 g/dL (ref 6.4–8.3)

## 2017-05-19 LAB — CBC WITH DIFFERENTIAL/PLATELET
BASOS ABS: 0 10*3/uL (ref 0.0–0.1)
BASOS PCT: 1 %
EOS ABS: 0.1 10*3/uL (ref 0.0–0.5)
EOS PCT: 2 %
HCT: 48.4 % (ref 38.4–49.9)
Hemoglobin: 16.1 g/dL (ref 13.0–17.1)
LYMPHS PCT: 22 %
Lymphs Abs: 1.3 10*3/uL (ref 0.9–3.3)
MCH: 40.5 pg — ABNORMAL HIGH (ref 27.2–33.4)
MCHC: 33.3 g/dL (ref 32.0–36.0)
MCV: 121.5 fL — AB (ref 79.3–98.0)
Monocytes Absolute: 0.6 10*3/uL (ref 0.1–0.9)
Monocytes Relative: 11 %
Neutro Abs: 3.9 10*3/uL (ref 1.5–6.5)
Neutrophils Relative %: 64 %
PLATELETS: 164 10*3/uL (ref 140–400)
RBC: 3.98 MIL/uL — AB (ref 4.20–5.82)
RDW: 15.8 % — ABNORMAL HIGH (ref 11.0–14.6)
WBC: 6 10*3/uL (ref 4.0–10.3)

## 2017-05-19 MED ORDER — AMOXICILLIN-POT CLAVULANATE 875-125 MG PO TABS: 1.0000 | ORAL_TABLET | Freq: Two times a day (BID) | ORAL | 0 refills | Status: DC

## 2017-05-19 NOTE — Telephone Encounter (Signed)
Gave patient avs and calendar with appts per 3/15 los.  °

## 2017-06-09 ENCOUNTER — Inpatient Hospital Stay: Payer: Medicare Other | Attending: Hematology

## 2017-06-09 DIAGNOSIS — D751 Secondary polycythemia: Secondary | ICD-10-CM | POA: Insufficient documentation

## 2017-06-09 DIAGNOSIS — I10 Essential (primary) hypertension: Secondary | ICD-10-CM | POA: Diagnosis not present

## 2017-06-09 DIAGNOSIS — R0989 Other specified symptoms and signs involving the circulatory and respiratory systems: Secondary | ICD-10-CM | POA: Diagnosis not present

## 2017-06-09 DIAGNOSIS — Z79899 Other long term (current) drug therapy: Secondary | ICD-10-CM | POA: Insufficient documentation

## 2017-06-09 DIAGNOSIS — E119 Type 2 diabetes mellitus without complications: Secondary | ICD-10-CM | POA: Insufficient documentation

## 2017-06-09 DIAGNOSIS — F17210 Nicotine dependence, cigarettes, uncomplicated: Secondary | ICD-10-CM | POA: Diagnosis not present

## 2017-06-09 DIAGNOSIS — Z7901 Long term (current) use of anticoagulants: Secondary | ICD-10-CM | POA: Diagnosis not present

## 2017-06-09 DIAGNOSIS — Z86718 Personal history of other venous thrombosis and embolism: Secondary | ICD-10-CM | POA: Diagnosis not present

## 2017-06-09 LAB — CBC WITH DIFFERENTIAL/PLATELET
BASOS ABS: 0.1 10*3/uL (ref 0.0–0.1)
BASOS PCT: 2 %
Eosinophils Absolute: 0.2 10*3/uL (ref 0.0–0.5)
Eosinophils Relative: 5 %
HEMATOCRIT: 53.3 % — AB (ref 38.4–49.9)
HEMOGLOBIN: 17.6 g/dL — AB (ref 13.0–17.1)
Lymphocytes Relative: 27 %
Lymphs Abs: 1.3 10*3/uL (ref 0.9–3.3)
MCH: 39.3 pg — ABNORMAL HIGH (ref 27.2–33.4)
MCHC: 32.9 g/dL (ref 32.0–36.0)
MCV: 119.2 fL — ABNORMAL HIGH (ref 79.3–98.0)
Monocytes Absolute: 0.5 10*3/uL (ref 0.1–0.9)
Monocytes Relative: 10 %
NEUTROS ABS: 2.9 10*3/uL (ref 1.5–6.5)
NEUTROS PCT: 56 %
Platelets: 228 10*3/uL (ref 140–400)
RBC: 4.47 MIL/uL (ref 4.20–5.82)
RDW: 15.3 % — ABNORMAL HIGH (ref 11.0–14.6)
WBC: 5 10*3/uL (ref 4.0–10.3)

## 2017-06-14 ENCOUNTER — Telehealth: Payer: Self-pay | Admitting: *Deleted

## 2017-06-14 NOTE — Telephone Encounter (Signed)
Spoke with pt and informed pt of lab resuts and instructions from Dr. Burr Medico below.  Pt stated he is trying to stop smoking. Confirmed next appts on  06/28/17 .   Pt voiced understanding.

## 2017-06-14 NOTE — Telephone Encounter (Signed)
-----   Message from Truitt Merle, MD sent at 06/09/2017 12:39 PM EDT ----- Please let pt know his CBC result, HCT elevated since he came off hydrea, ok to continue to be off hydrea, hope he has quit smoking completely. Will repeat lab with his next appointment in 2-3 weeks.  Truitt Merle  06/09/2017

## 2017-06-27 NOTE — Progress Notes (Signed)
[facility] PROGRESS NOTE 06/28/2017   Isaiah York, [place] [address]  DIAGNOSIS: History of DVT (deep vein thrombosis)  Polycythemia, secondary  CURRENT THERAPY:  Phlebotomy to maintain a hemocrit less than 47% (has been held due to pt's preference). Hydrea 500 mg daily started on 03/25/2013 and increased to 1,000 mg daily later, and decreased back to 557m daily due to cytopenia, now on 5031mdaily 5 days a week. Stopped on 05/19/17  INTERVAL HISTORY:  Isaiah [age]RATER89.o. male returns for follow-up of polycythemia. He presents to the clinic today by himself. He reports he is doing well. He states he had cut down to 3 cigarettes but is back up to 5 since the weather is better.   On review of systems, pt denies any other complaints at this time. Pertinent positives are listed and detailed within the above HPI.   MEDICAL HISTORY: Past Medical History:  Diagnosis Date  . HTN (hypertension)     INTERIM HISTORY: has Polycythemia, secondary; Tobacco abuse; DVT, lower extremity ([facility] DVT of lower extremity (deep venous thrombosis), unspecified laterality; and History of DVT (deep vein thrombosis) on their problem list.    ALLERGIES:  is allergic to tape.  MEDICATIONS: has a current medication list which includes the following prescription(s): amlodipine, folic acid, hydroxyurea, losartan, pravastatin, and warfarin.  SURGICAL HISTORY: History reviewed. No pertinent surgical history.  REVIEW OF SYSTEMS:   Constitutional: Denies fevers, chills or abnormal weight loss Eyes: Denies blurriness of vision Ears, nose, mouth, throat, and face: Denies mucositis or sore throat (+) cough, improving  Respiratory: Denies cough, dyspnea or wheezes  Cardiovascular: Denies palpitation, chest discomfort or lower extremity swelling  Gastrointestinal:  Denies nausea, heartburn or change in bowel habits Skin: Denies abnormal skin  rashes Lymphatics: Denies new lymphadenopathy or easy bruising Neurological:Denies numbness, tingling or new weaknesses Behavioral/Psych: Mood is stable, no new changes  All other systems were reviewed with the patient and are negative.  PHYSICAL EXAMINATION:  ECOG PERFORMANCE STATUS: 1  BP (!) 153/73 (BP Location: Left Arm, Patient Position: Sitting)   Pulse 86   Temp 97.8 F (36.6 C) (Oral)   Resp 18   Ht _0  (1.676 m)   Wt 128 lb 9.6 oz (58.3 kg)   SpO2 99%   BMI 20.76 kg/m    GENERAL:alert, no distress and comfortable elderly who appears his stated age; thin SKIN: skin color, texture, turgor are normal, no rashes or significant lesions EYES: normal, Conjunctiva are pink and non-injected, sclera clear OROPHARYNX:no exudate, no erythema and lips, buccal mucosa, and tongue normal  NECK: supple, thyroid normal size, non-tender, without nodularity LYMPH:  no palpable lymphadenopathy in the cervical, axillary or inguinal LUNGS: (+) Diminished breath sounds bilaterally. No rales or wheezing.  HEART: regular rate & rhythm and no murmurs and no lower extremity edema ABDOMEN:abdomen soft, non-tender and normal bowel sounds Musculoskeletal:no cyanosis of digits and no clubbing  NEURO: alert & oriented x 3 with fluent speech, no focal motor/sensory deficits  LABORATORY DATA: CBC Latest Ref Rng & Units 06/28/2017 06/09/2017 05/19/2017  WBC 4.0 - 10.3 K/uL 6.2 5.0 6.0  Hemoglobin 13.0 - 17.1 g/dL 17.8(H) 17.6(H) 16.1  Hematocrit 38.4 - 49.9 % 53.3(H) 53.3(H) 48.4  Platelets 140 - 400 K/uL 215 228 164   CMP Latest Ref Rng & Units 06/28/2017 05/19/2017 01/20/2017  Glucose 70 - 140 mg/dL 147(H) 101 129  BUN 7 - 26 mg/dL 10 10 9.7  Creatinine 0.70 - 1.30 mg/dL 1.10 1.00 1.1  Sodium 136 - 145 mmol/L 138 137 138  Potassium 3.5 - 5.1 mmol/L 4.0 4.2 4.0  Chloride 98 - 109 mmol/L 104 104 -  CO2 22 - 29 mmol/L 20(L) 21(L) 22  Calcium 8.4 - 10.4 mg/dL 10.0 9.3 9.6  Total Protein 6.4 - 8.3 g/dL  7.8 7.3 7.5  Total Bilirubin 0.2 - 1.2 mg/dL 0.7 0.6 0.84  Alkaline Phos 40 - 150 U/L 55 41 48  AST 5 - 34 U/L _0 ALT 0 - 55 U/L _1 RADIOGRAPHIC STUDIES: RIGHT LOWER EXTREMITY VENOUS DUPLEX ULTRASOUND  (10/25/2011) Technique: Gray-scale sonography with graded compression, as well as color Doppler and duplex ultrasound, were performed to evaluate the deep venous system of the lower extremity from the level of the common femoral vein through the popliteal and proximal calf veins. Spectral Doppler was utilized to evaluate flow at rest and with distal augmentation maneuvers. Comparison: 06/02/2011 Findings: Residual echogenic intraluminal thrombus noted in the superficial femoral and popliteal veins. Partially occlusive thrombus persist at the saphenofemoral junction extending into the great saphenous vein. Slight interval decrease in thrombus burden throughout the right lower extremity deep veins and the superficial saphenous vein. No occlusive DVT demonstrated on today's study. IMPRESSION:  Residual partially occlusive right femoral popliteal DVT. Some interval improvement. Residual superficial thrombosis of the great saphenous vein, little interval change. Original Report Authenticated By: Jerilynn Mages. Daryll Brod, M.D.   ASSESSMENT: 72 y.o. male   PLAN:  1. 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 JAK2 mutation (-), erythropoietin normal at 3.1  --He was previously tested negative for JAK2 V617F and exon 12 mutation, his EPO level was normal.  -We previously discussed with patient that his polycythemia is likely secondary (to smoking) -I repeatedly strongly encouraged pt to quit smoking, he has tried but could not quit completely   -He has not had bone marrow biopsy. Due to negative JAK2, PV is much less likely, but MPN is not excluded. Bone marrow biopsy would be helpful in the diagnosis. I again encourage him to think about it  --He does not like phlebotomy, and was started on Hydrea.  I will continue hydrea to maintain his hct less than 47% and reduce his risk of further clots including CVA, MI and DVT. -We previously discussed that we usually do not give Hydrea for secondary polycythemia, due to its long-term side effects, especially MDS and leukemia. We previously discussed several managment options. Ideally, I would like to take him off Hydrea and for him to stop smoking. If not, I have recommended either stopping Hydrea and getting a bone marrow biopsy in a month, to see if he has MPN, or continuing Hydrea, with understanding the long term risks and side effects.  -He has cut his smoking significantly, I think it is okay for him to stop hydrea for now (05/19/17) I will monitor his count closely  -labs reviewed, HCT is 53.3, goal is to keep below 50% -At this point I recommend a bone marrow biopsy to rule out primary polycythemia vera. If he has secondary, I think his body may be able to tolerate increased hematocrit and will be at a smaller risk for heart attack, stroke and clots. I discussed the procedure the pt and he will think about it. He does not want to do it now.  -He is on Coumadin, which are significant for TSA's risk of thrombosis.  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 -He will follow-up with his primary care physician for his PT/INR and Coumadin dose adjustment. -He will continue Coumadin.   3. Tobacco abuse.  -Likely contributing to #1.  He is not willing to quit but has cut back. He is down to 1 pack for 3 days -I again strongly encouraged him to stop smoking completely. His polycythemia may resolve after quit smoking and he may not need any treatment. -We previously discussed lung cancer screening with CT chest again,  he declined. - I strongly encouraged him to try to stop completely. He wants to avoid having to use oxygen in the future -He tried smoking patch but  had allergic reaction to adhesive material. He stopped using.  -He does have chest congestion which is related to his smoking.  -He reduced down to 5 cigarettes a day, he takes Chantix once a day now which is helping him quit -I again encouraged him to completely stop smoking in the next 2-3 months   4. DM -Not on medication -Monitored by his PCP Dr. Rex Kras    Plan -Discussed bone marrow biopsy today, he will think about it -Continue smoking cessation  -Continue Coumadin -f/u in 2 months with lab    All questions were answered. The patient knows to call the clinic with any problems, questions or concerns. We can certainly see the patient much sooner if necessary.  I spent 15 minutes counseling the patient face to face. The total time spent in the appointment was 20 minutes.  This document serves as a record of services personally performed by Truitt Merle, MD. It was created on her behalf by Theresia Bough, a trained medical scribe. The creation of this record is based on the scribe's personal observations and the provider's statements to them.   I have reviewed the above documentation for accuracy and completeness, and I agree with the above.   Truitt Merle 06/28/2017

## 2017-06-28 ENCOUNTER — Inpatient Hospital Stay: Payer: Medicare Other

## 2017-06-28 ENCOUNTER — Telehealth: Payer: Self-pay | Admitting: Hematology

## 2017-06-28 ENCOUNTER — Encounter: Payer: Self-pay | Admitting: Hematology

## 2017-06-28 ENCOUNTER — Inpatient Hospital Stay (HOSPITAL_BASED_OUTPATIENT_CLINIC_OR_DEPARTMENT_OTHER): Payer: Medicare Other | Admitting: Hematology

## 2017-06-28 VITALS — BP 153/73 | HR 86 | Temp 97.8°F | Resp 18 | Ht 66.0 in | Wt 128.6 lb

## 2017-06-28 DIAGNOSIS — F17210 Nicotine dependence, cigarettes, uncomplicated: Secondary | ICD-10-CM

## 2017-06-28 DIAGNOSIS — D751 Secondary polycythemia: Secondary | ICD-10-CM | POA: Diagnosis not present

## 2017-06-28 DIAGNOSIS — I10 Essential (primary) hypertension: Secondary | ICD-10-CM

## 2017-06-28 DIAGNOSIS — E119 Type 2 diabetes mellitus without complications: Secondary | ICD-10-CM | POA: Diagnosis not present

## 2017-06-28 DIAGNOSIS — Z7901 Long term (current) use of anticoagulants: Secondary | ICD-10-CM

## 2017-06-28 DIAGNOSIS — R0989 Other specified symptoms and signs involving the circulatory and respiratory systems: Secondary | ICD-10-CM

## 2017-06-28 DIAGNOSIS — Z86718 Personal history of other venous thrombosis and embolism: Secondary | ICD-10-CM

## 2017-06-28 DIAGNOSIS — Z79899 Other long term (current) drug therapy: Secondary | ICD-10-CM

## 2017-06-28 LAB — CBC WITH DIFFERENTIAL/PLATELET
BASOS PCT: 1 %
Basophils Absolute: 0.1 10*3/uL (ref 0.0–0.1)
EOS ABS: 0.2 10*3/uL (ref 0.0–0.5)
EOS PCT: 3 %
HCT: 53.3 % — ABNORMAL HIGH (ref 38.4–49.9)
Hemoglobin: 17.8 g/dL — ABNORMAL HIGH (ref 13.0–17.1)
LYMPHS ABS: 1.6 10*3/uL (ref 0.9–3.3)
Lymphocytes Relative: 26 %
MCH: 38.6 pg — AB (ref 27.2–33.4)
MCHC: 33.4 g/dL (ref 32.0–36.0)
MCV: 115.6 fL — ABNORMAL HIGH (ref 79.3–98.0)
MONOS PCT: 12 %
Monocytes Absolute: 0.7 10*3/uL (ref 0.1–0.9)
NEUTROS PCT: 58 %
Neutro Abs: 3.6 10*3/uL (ref 1.5–6.5)
PLATELETS: 215 10*3/uL (ref 140–400)
RBC: 4.61 MIL/uL (ref 4.20–5.82)
RDW: 13.2 % (ref 11.0–14.6)
WBC: 6.2 10*3/uL (ref 4.0–10.3)

## 2017-06-28 LAB — COMPREHENSIVE METABOLIC PANEL
ALK PHOS: 55 U/L (ref 40–150)
ALT: 17 U/L (ref 0–55)
AST: 25 U/L (ref 5–34)
Albumin: 4.3 g/dL (ref 3.5–5.0)
Anion gap: 14 — ABNORMAL HIGH (ref 3–11)
BUN: 10 mg/dL (ref 7–26)
CALCIUM: 10 mg/dL (ref 8.4–10.4)
CHLORIDE: 104 mmol/L (ref 98–109)
CO2: 20 mmol/L — AB (ref 22–29)
CREATININE: 1.1 mg/dL (ref 0.70–1.30)
Glucose, Bld: 147 mg/dL — ABNORMAL HIGH (ref 70–140)
Potassium: 4 mmol/L (ref 3.5–5.1)
SODIUM: 138 mmol/L (ref 136–145)
Total Bilirubin: 0.7 mg/dL (ref 0.2–1.2)
Total Protein: 7.8 g/dL (ref 6.4–8.3)

## 2017-06-28 NOTE — Telephone Encounter (Signed)
Scheduled appt per 4/24 los - Gave patient aVS and calender per los.  

## 2017-09-26 NOTE — Progress Notes (Signed)
[facility] PROGRESS NOTE 09/27/2017   Isaiah Fess, MD [place] [address]  DIAGNOSIS: Polycythemia, secondary  PREVIOUS AND CURRENT THERAPY:  Phlebotomy to maintain a hemocrit less than 47% (has been held due to pt's preference). Hydrea 500 mg daily started on 03/25/2013 and increased to 1,000 mg daily later, and decreased back to 544m daily due to cytopenia, now on 5067mdaily 5 days a week. Stopped on 05/19/17  INTERVAL HISTORY:  Isaiah ZEISS89.o. male returns for follow-up of polycythemia. He was last seen by me on 06/28/17. He presents to the clinic today by himself. He notes he is doing well. He denies anything new since last visit. He notes he is down to 6-7 cigarettes a day. He understands he needs to quit and will continue with Chantix. He is now ready to proceed with bone marrow biopsy.    MEDICAL HISTORY: Past Medical History:  Diagnosis Date  . HTN (hypertension)     INTERIM HISTORY: has Polycythemia, secondary; Tobacco abuse; DVT, lower extremity ([facility]VT of lower extremity (deep venous thrombosis), unspecified laterality; and History of DVT (deep vein thrombosis) on their problem list.    ALLERGIES:  is allergic to tape.  MEDICATIONS: has a current medication list which includes the following prescription(s): amlodipine, folic acid, hydroxyurea, losartan, pravastatin, and warfarin.  SURGICAL HISTORY: History reviewed. No pertinent surgical history.  REVIEW OF SYSTEMS:   Constitutional: Denies fevers, chills or abnormal weight loss Eyes: Denies blurriness of vision Ears, nose, mouth, throat, and face: Denies mucositis or sore throat   Respiratory: Denies cough, dyspnea or wheezes  Cardiovascular: Denies palpitation, chest discomfort or lower extremity swelling  Gastrointestinal:  Denies nausea, heartburn or change in bowel habits Skin: Denies abnormal skin rashes Lymphatics: Denies new lymphadenopathy or  easy bruising Neurological:Denies numbness, tingling or new weaknesses Behavioral/Psych: Mood is stable, no new changes  All other systems were reviewed with the patient and are negative.  PHYSICAL EXAMINATION:  ECOG PERFORMANCE STATUS: 1  BP (!) 149/78 (BP Location: Left Arm, Patient Position: Sitting)   Pulse 84   Temp 98.2 F (36.8 C) (Oral)   Resp 17   Ht 5' 6" (1.676 m)   Wt 128 lb 3.2 oz (58.2 kg)   SpO2 99%   BMI 20.69 kg/m    GENERAL:alert, no distress and comfortable elderly who appears his stated age; thin SKIN: skin color, texture, turgor are normal, no rashes or significant lesions EYES: normal, Conjunctiva are pink and non-injected, sclera clear OROPHARYNX:no exudate, no erythema and lips, buccal mucosa, and tongue normal  NECK: supple, thyroid normal size, non-tender, without nodularity LYMPH:  no palpable lymphadenopathy in the cervical, axillary or inguinal LUNGS: (+) Diminished breath sounds bilaterally. No rales or wheezing.  HEART: regular rate & rhythm and no murmurs and no lower extremity edema ABDOMEN:abdomen soft, non-tender and normal bowel sounds Musculoskeletal:no cyanosis of digits and no clubbing  NEURO: alert & oriented x 3 with fluent speech, no focal motor/sensory deficits  LABORATORY DATA: CBC Latest Ref Rng & Units 09/27/2017 06/28/2017 06/09/2017  WBC 4.0 - 10.3 K/uL 6.8 6.2 5.0  Hemoglobin 13.0 - 17.1 g/dL 17.5(H) 17.8(H) 17.6(H)  Hematocrit 38.4 - 49.9 % 52.3(H) 53.3(H) 53.3(H)  Platelets 140 - 400 K/uL 214 215 228   CMP Latest Ref Rng & Units 06/28/2017 05/19/2017 01/20/2017  Glucose 70 - 140 mg/dL 147(H) 101 129  BUN 7 - 26 mg/dL 10 10 9.7  Creatinine 0.70 -  1.30 mg/dL 1.10 1.00 1.1  Sodium 136 - 145 mmol/L 138 137 138  Potassium 3.5 - 5.1 mmol/L 4.0 4.2 4.0  Chloride 98 - 109 mmol/L 104 104 -  CO2 22 - 29 mmol/L 20(L) 21(L) 22  Calcium 8.4 - 10.4 mg/dL 10.0 9.3 9.6  Total Protein 6.4 - 8.3 g/dL 7.8 7.3 7.5  Total Bilirubin 0.2 - 1.2  mg/dL 0.7 0.6 0.84  Alkaline Phos 40 - 150 U/L 55 41 48  AST 5 - 34 U/L _0 ALT 0 - 55 U/L _1 RADIOGRAPHIC STUDIES: RIGHT LOWER EXTREMITY VENOUS DUPLEX ULTRASOUND  (10/25/2011) Technique: Gray-scale sonography with graded compression, as well as color Doppler and duplex ultrasound, were performed to evaluate the deep venous system of the lower extremity from the level of the common femoral vein through the popliteal and proximal calf veins. Spectral Doppler was utilized to evaluate flow at rest and with distal augmentation maneuvers. Comparison: 06/02/2011 Findings: Residual echogenic intraluminal thrombus noted in the superficial femoral and popliteal veins. Partially occlusive thrombus persist at the saphenofemoral junction extending into the great saphenous vein. Slight interval decrease in thrombus burden throughout the right lower extremity deep veins and the superficial saphenous vein. No occlusive DVT demonstrated on today's study. IMPRESSION:  Residual partially occlusive right femoral popliteal DVT. Some interval improvement. Residual superficial thrombosis of the great saphenous vein, little interval change. Original Report Authenticated By: Jerilynn Mages. Daryll Brod, M.D.   ASSESSMENT: 72 y.o. male with     1. 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 JAK2 mutation (-), erythropoietin normal at 3.1  -He previously tested negative for JAK2 V617F and exon 12 mutation, his EPO level was normal.  -We previously discussed that his polycythemia is likely secondary (to smoking). I repeatedly strongly encouraged pt to quit smoking, he has cut it down significantly but could not quit completely   -He has not had bone marrow biopsy. Due to negative JAK2, PV is much less likely, but MPN is not excluded. Bone marrow biopsy would be helpful in the diagnosis. I again encouraged him to think about it  -He does not like phlebotomy, and was started on Hydrea in 03/2013 to maintain his hct less than  47% and reduce his risk of further clots including CVA, MI and DVT. -We previously discussed that we usually do not give Hydrea for secondary polycythemia, due to its long-term side effects, especially MDS and leukemia. After a lengthy discussion, I have held his hydrea since 05/2017 -He is on Coumadin, which are significant for TSA's risk of thrombosis. -His hemoglobin and hematocrit has increased significantly since he came off Hydrea.  Hematocrit 53% today.  He agrees to have a phlebotomy next week. -I again recommended a bone marrow biopsy to rule out primary polycythemia vera or other MPN. If he has secondary, I think his body may be able to tolerate increased hematocrit and will be at a smaller risk for heart attack, stroke and blood clots. I discussed the procedure the pt and he agreed to proceed with biopsy in 11/2017.   -F/u in 12/2017  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 -He will follow-up with his primary care physician for his PT/INR and Coumadin dose adjustment. -He will continue Coumadin, may need to hold around bone marrow biopsy date.   3. Tobacco abuse -Likely contributing to #1. He is not willing to quit but has cut back.  -Repeatedly  I have strongly encouraged him to stop smoking completely. His polycythemia may resolve after quit smoking and he may not need any treatment and he wants to avoid oxygen use in the future.  -We previously discussed lung cancer screening with CT chest again, he declined. -He tried smoking patch but had allergic reaction to adhesive material. He stopped using.  -He previously reduced down to 5 cigarettes a day with Chantix once a day. However now he is currently at 6-7 cigarettes a day given stress from wife's stress. -He will continue Chantix to work on completely stopping smoking.    4. DM -Not on medication -Monitored by his PCP Dr. Rex Kras     Plan -Labs reviewed, HCT at 52.3% -Phlebotomy in a few weeks (Friday) -Bone marrow biopsy by Mendel Ryder in late Sep on Friday morning  -Lab and f/u 2 weeks after biopsy    All questions were answered. The patient knows to call the clinic with any problems, questions or concerns. We can certainly see the patient much sooner if necessary.  I spent 20 minutes counseling the patient face to face. The total time spent in the appointment was 25 minutes.  Oneal Deputy, am acting as scribe for Truitt Merle, MD.   I have reviewed the above documentation for accuracy and completeness, and I agree with the above.    Truitt Merle 09/27/2017

## 2017-09-27 ENCOUNTER — Encounter: Payer: Self-pay | Admitting: Hematology

## 2017-09-27 ENCOUNTER — Inpatient Hospital Stay: Payer: Medicare Other

## 2017-09-27 ENCOUNTER — Telehealth: Payer: Self-pay | Admitting: Hematology

## 2017-09-27 ENCOUNTER — Inpatient Hospital Stay: Payer: Medicare Other | Attending: Hematology | Admitting: Hematology

## 2017-09-27 VITALS — BP 149/78 | HR 84 | Temp 98.2°F | Resp 17 | Ht 66.0 in | Wt 128.2 lb

## 2017-09-27 DIAGNOSIS — D751 Secondary polycythemia: Secondary | ICD-10-CM | POA: Insufficient documentation

## 2017-09-27 DIAGNOSIS — Z86718 Personal history of other venous thrombosis and embolism: Secondary | ICD-10-CM | POA: Diagnosis not present

## 2017-09-27 DIAGNOSIS — F17210 Nicotine dependence, cigarettes, uncomplicated: Secondary | ICD-10-CM

## 2017-09-27 DIAGNOSIS — E119 Type 2 diabetes mellitus without complications: Secondary | ICD-10-CM | POA: Diagnosis not present

## 2017-09-27 DIAGNOSIS — Z79899 Other long term (current) drug therapy: Secondary | ICD-10-CM | POA: Diagnosis not present

## 2017-09-27 DIAGNOSIS — I10 Essential (primary) hypertension: Secondary | ICD-10-CM | POA: Insufficient documentation

## 2017-09-27 DIAGNOSIS — Z7901 Long term (current) use of anticoagulants: Secondary | ICD-10-CM | POA: Diagnosis not present

## 2017-09-27 LAB — CBC WITH DIFFERENTIAL/PLATELET
BASOS ABS: 0.1 10*3/uL (ref 0.0–0.1)
Basophils Relative: 1 %
Eosinophils Absolute: 0.3 10*3/uL (ref 0.0–0.5)
Eosinophils Relative: 5 %
HEMATOCRIT: 52.3 % — AB (ref 38.4–49.9)
Hemoglobin: 17.5 g/dL — ABNORMAL HIGH (ref 13.0–17.1)
LYMPHS ABS: 2 10*3/uL (ref 0.9–3.3)
LYMPHS PCT: 29 %
MCH: 34 pg — ABNORMAL HIGH (ref 27.2–33.4)
MCHC: 33.5 g/dL (ref 32.0–36.0)
MCV: 101.6 fL — AB (ref 79.3–98.0)
Monocytes Absolute: 0.7 10*3/uL (ref 0.1–0.9)
Monocytes Relative: 10 %
Neutro Abs: 3.8 10*3/uL (ref 1.5–6.5)
Neutrophils Relative %: 55 %
Platelets: 214 10*3/uL (ref 140–400)
RBC: 5.15 MIL/uL (ref 4.20–5.82)
RDW: 14.1 % (ref 11.0–14.6)
WBC: 6.8 10*3/uL (ref 4.0–10.3)

## 2017-09-27 NOTE — Telephone Encounter (Signed)
Gave patient avs and calendar of upcoming appts.  °

## 2017-10-13 ENCOUNTER — Inpatient Hospital Stay: Payer: Medicare Other | Attending: Hematology

## 2017-10-13 DIAGNOSIS — E119 Type 2 diabetes mellitus without complications: Secondary | ICD-10-CM | POA: Insufficient documentation

## 2017-10-13 DIAGNOSIS — Z86718 Personal history of other venous thrombosis and embolism: Secondary | ICD-10-CM | POA: Insufficient documentation

## 2017-10-13 DIAGNOSIS — F17210 Nicotine dependence, cigarettes, uncomplicated: Secondary | ICD-10-CM | POA: Insufficient documentation

## 2017-10-13 DIAGNOSIS — Z7901 Long term (current) use of anticoagulants: Secondary | ICD-10-CM | POA: Insufficient documentation

## 2017-10-13 DIAGNOSIS — D7589 Other specified diseases of blood and blood-forming organs: Secondary | ICD-10-CM | POA: Insufficient documentation

## 2017-10-13 DIAGNOSIS — D751 Secondary polycythemia: Secondary | ICD-10-CM | POA: Insufficient documentation

## 2017-10-13 NOTE — Progress Notes (Signed)
0930  -  Attempted IV stick in Right posterior FA with 18G angiocath for phlebotomy without difficulty.  Blood noted and drained very sluggish.  Despite repositioning, blood clotted - unable to perform procedure.  Catheter d/c intact.  Site unremarkable.  Pt voiced no complaints.   Restick pt with 18G angiocath in lateral aspect of antecubital.  Again, blood clotted.  Catheter d/c intact. Site unremarkable.  Pt tolerated procedure without difficulty. Dr. Burr Medico notified.  OK to reschedule pt to next week for IVF and phlebotomy as per md. Explanations given to pt.  Pt voiced understanding.  Pt sent to scheduling dept. Pt was stable at discharge via ambulation with NT to scheduling dept.

## 2017-10-17 ENCOUNTER — Telehealth: Payer: Self-pay | Admitting: Hematology

## 2017-10-17 NOTE — Telephone Encounter (Signed)
Called Isaiah York regarding 8/23 and she requested 8/30 for follow up

## 2017-10-18 ENCOUNTER — Inpatient Hospital Stay: Payer: Medicare Other

## 2017-10-23 ENCOUNTER — Other Ambulatory Visit: Payer: Self-pay

## 2017-10-23 DIAGNOSIS — D751 Secondary polycythemia: Secondary | ICD-10-CM

## 2017-10-24 ENCOUNTER — Inpatient Hospital Stay (HOSPITAL_BASED_OUTPATIENT_CLINIC_OR_DEPARTMENT_OTHER): Payer: Medicare Other | Admitting: Adult Health

## 2017-10-24 ENCOUNTER — Inpatient Hospital Stay: Payer: Medicare Other

## 2017-10-24 VITALS — BP 137/76 | HR 78 | Temp 98.0°F | Resp 16

## 2017-10-24 DIAGNOSIS — D751 Secondary polycythemia: Secondary | ICD-10-CM | POA: Diagnosis present

## 2017-10-24 DIAGNOSIS — F17210 Nicotine dependence, cigarettes, uncomplicated: Secondary | ICD-10-CM | POA: Diagnosis not present

## 2017-10-24 DIAGNOSIS — Z7901 Long term (current) use of anticoagulants: Secondary | ICD-10-CM | POA: Diagnosis not present

## 2017-10-24 DIAGNOSIS — D7589 Other specified diseases of blood and blood-forming organs: Secondary | ICD-10-CM | POA: Diagnosis not present

## 2017-10-24 DIAGNOSIS — E119 Type 2 diabetes mellitus without complications: Secondary | ICD-10-CM | POA: Diagnosis not present

## 2017-10-24 DIAGNOSIS — Z86718 Personal history of other venous thrombosis and embolism: Secondary | ICD-10-CM | POA: Diagnosis not present

## 2017-10-24 LAB — CBC WITH DIFFERENTIAL (CANCER CENTER ONLY)
Basophils Absolute: 0.1 10*3/uL (ref 0.0–0.1)
Basophils Relative: 1 %
Eosinophils Absolute: 0.3 10*3/uL (ref 0.0–0.5)
Eosinophils Relative: 5 %
HEMATOCRIT: 53.6 % — AB (ref 38.4–49.9)
Hemoglobin: 17.6 g/dL — ABNORMAL HIGH (ref 13.0–17.1)
LYMPHS PCT: 23 %
Lymphs Abs: 1.5 10*3/uL (ref 0.9–3.3)
MCH: 33 pg (ref 27.2–33.4)
MCHC: 32.9 g/dL (ref 32.0–36.0)
MCV: 100.4 fL — AB (ref 79.3–98.0)
MONOS PCT: 9 %
Monocytes Absolute: 0.6 10*3/uL (ref 0.1–0.9)
NEUTROS ABS: 4.1 10*3/uL (ref 1.5–6.5)
Neutrophils Relative %: 62 %
Platelet Count: 233 10*3/uL (ref 140–400)
RBC: 5.34 MIL/uL (ref 4.20–5.82)
RDW: 14.8 % — AB (ref 11.0–14.6)
WBC Count: 6.6 10*3/uL (ref 4.0–10.3)

## 2017-10-24 MED ORDER — LIDOCAINE HCL 2 % IJ SOLN
INTRAMUSCULAR | Status: AC
  Filled 2017-10-24: qty 20

## 2017-10-24 NOTE — Progress Notes (Signed)
INDICATION: polycythemia    Bone Marrow Biopsy and Aspiration Procedure Note   Informed consent was obtained and potential risks including bleeding, infection and pain were reviewed with the patient.  The patient's name, date of birth, identification, consent and allergies were verified prior to the start of procedure and time out was performed.  The left posterior iliac crest was chosen as the site of biopsy.  The skin was prepped with ChloraPrep.   5 cc of 2% lidocaine was used to provide local anaesthesia.   10 cc of bone marrow aspirate was obtained followed by 1cm biopsy.  Pressure was applied to the biopsy site and bandage was placed over the biopsy site. Patient was made to lie on the back for 30 mins prior to discharge.  The procedure was tolerated well. COMPLICATIONS: None BLOOD LOSS: none The patient was discharged home in stable condition with a 1 week follow up to review results.  Patient was provided with post bone marrow biopsy instructions and instructed to call if there was any bleeding or worsening pain.  Specimens sent for flow cytometry, cytogenetics and additional studies.  Signed Scot Dock, NP

## 2017-10-24 NOTE — Patient Instructions (Signed)
Bone Marrow Aspiration and Bone Marrow Biopsy, Adult, Care After This sheet gives you information about how to care for yourself after your procedure. Your health care provider may also give you more specific instructions. If you have problems or questions, contact your health care provider. What can I expect after the procedure? After the procedure, it is common to have:  Mild pain and tenderness.  Swelling.  Bruising.  Follow these instructions at home:  Take over-the-counter or prescription medicines only as told by your health care provider.  Do not take baths, swim, or use a hot tub until your health care provider approves. Ask if you can take a shower or have a sponge bath.  Follow instructions from your health care provider about how to take care of the puncture site. Make sure you: ? Wash your hands with soap and water before you change your bandage (dressing). If soap and water are not available, use hand sanitizer. ? Change your dressing as told by your health care provider.  Check your puncture siteevery day for signs of infection. Check for: ? More redness, swelling, or pain. ? More fluid or blood. ? Warmth. ? Pus or a bad smell.  Return to your normal activities as told by your health care provider. Ask your health care provider what activities are safe for you.  Do not drive for 24 hours if you were given a medicine to help you relax (sedative).  Keep all follow-up visits as told by your health care provider. This is important. Contact a health care provider if:  You have more redness, swelling, or pain around the puncture site.  You have more fluid or blood coming from the puncture site.  Your puncture site feels warm to the touch.  You have pus or a bad smell coming from the puncture site.  You have a fever.  Your pain is not controlled with medicine. This information is not intended to replace advice given to you by your health care provider. Make sure  you discuss any questions you have with your health care provider. Document Released: 09/10/2004 Document Revised: 09/11/2015 Document Reviewed: 08/05/2015 Elsevier Interactive Patient Education  2018 [employer].

## 2017-10-24 NOTE — Progress Notes (Signed)
Patient was discharged with instructions on care and s/s of infection to monitor for.   No distress.  VS stable.  Patient voiced understanding with no further needs at this time.

## 2017-10-25 ENCOUNTER — Ambulatory Visit: Payer: Medicare Other

## 2017-10-25 ENCOUNTER — Telehealth: Payer: Self-pay | Admitting: Adult Health

## 2017-10-25 NOTE — Telephone Encounter (Signed)
Per 8/20 no los

## 2017-11-03 ENCOUNTER — Inpatient Hospital Stay: Payer: Medicare Other | Admitting: Hematology

## 2017-11-03 ENCOUNTER — Encounter: Payer: Self-pay | Admitting: Hematology

## 2017-11-03 ENCOUNTER — Telehealth: Payer: Self-pay | Admitting: Hematology

## 2017-11-03 VITALS — BP 158/78 | HR 92 | Temp 98.0°F | Resp 17 | Ht 66.0 in | Wt 125.3 lb

## 2017-11-03 DIAGNOSIS — Z720 Tobacco use: Secondary | ICD-10-CM

## 2017-11-03 DIAGNOSIS — E119 Type 2 diabetes mellitus without complications: Secondary | ICD-10-CM | POA: Diagnosis not present

## 2017-11-03 DIAGNOSIS — Z86718 Personal history of other venous thrombosis and embolism: Secondary | ICD-10-CM

## 2017-11-03 DIAGNOSIS — F17210 Nicotine dependence, cigarettes, uncomplicated: Secondary | ICD-10-CM

## 2017-11-03 DIAGNOSIS — D751 Secondary polycythemia: Secondary | ICD-10-CM | POA: Diagnosis not present

## 2017-11-03 DIAGNOSIS — Z7901 Long term (current) use of anticoagulants: Secondary | ICD-10-CM

## 2017-11-03 NOTE — Telephone Encounter (Signed)
Appts scheduled AVS/Calendar printed per 8/30 los

## 2017-11-03 NOTE — Progress Notes (Signed)
[facility] HEMATOLOGY OFFICE PROGRESS NOTE 11/03/2017   York, [name], MD [address] [place] [address]  DIAGNOSIS: Polycythemia, secondary  Tobacco abuse  PREVIOUS THERAPY:  Phlebotomy to maintain a hematocrit less than 47% (has been held due to pt's preference). Hydrea 500 mg daily started on 03/25/2013 and increased to 1,000 mg daily later, and decreased back to 500mg daily due to cytopenia, then reduced to 500mg daily 5 days a week. Stopped on 05/19/17  CURRENT THERAPY:  Observation   INTERVAL HISTORY:  Isaiah York 72 y.o. male returns for follow-up of polycythemia post bone marrow biopsy he presents to the clinic today accompanied bu his daughter. He notes his bone marrow biopsy went well. He notes since being off Hydrea he feel no change. He is doing well overall.       MEDICAL HISTORY: Past Medical History:  Diagnosis Date  . HTN (hypertension)     INTERIM HISTORY: has Polycythemia, secondary; Tobacco abuse; DVT, lower extremity (HCC); DVT of lower extremity (deep venous thrombosis), unspecified laterality; and History of DVT (deep vein thrombosis) on their problem list.    ALLERGIES:  is allergic to tape.  MEDICATIONS: has a current medication list which includes the following prescription(s): amlodipine, folic acid, hydroxyurea, losartan, pravastatin, and warfarin.  SURGICAL HISTORY: History reviewed. No pertinent surgical history.  REVIEW OF SYSTEMS:   Constitutional: Denies fevers, chills or abnormal weight loss Eyes: Denies blurriness of vision Ears, nose, mouth, throat, and face: Denies mucositis or sore throat   Respiratory: Denies cough, dyspnea or wheezes  Cardiovascular: Denies palpitation, chest discomfort or lower extremity swelling  Gastrointestinal:  Denies nausea, heartburn or change in bowel habits Skin: Denies abnormal skin rashes Lymphatics: Denies new lymphadenopathy or easy bruising Neurological:Denies numbness,  tingling or new weaknesses Behavioral/Psych: Mood is stable, no new changes  All other systems were reviewed with the patient and are negative.  PHYSICAL EXAMINATION:  ECOG PERFORMANCE STATUS: 1  BP (!) 158/78 (BP Location: Right Arm, Patient Position: Sitting) Comment: nurse aware of bp  Pulse 92   Temp 98 F (36.7 C) (Oral)   Resp 17   Ht 5' 6" (1.676 m)   Wt 125 lb 4.8 oz (56.8 kg)   SpO2 100%   BMI 20.22 kg/m    GENERAL:alert, no distress and comfortable elderly who appears his stated age; thin SKIN: skin color, texture, turgor are normal, no rashes or significant lesions EYES: normal, Conjunctiva are pink and non-injected, sclera clear OROPHARYNX:no exudate, no erythema and lips, buccal mucosa, and tongue normal  NECK: supple, thyroid normal size, non-tender, without nodularity LYMPH:  no palpable lymphadenopathy in the cervical, axillary or inguinal LUNGS: (+) Diminished breath sounds bilaterally. No rales or wheezing.  HEART: regular rate & rhythm and no murmurs and no lower extremity edema ABDOMEN:abdomen soft, non-tender and normal bowel sounds Musculoskeletal:no cyanosis of digits and no clubbing  NEURO: alert & oriented x 3 with fluent speech, no focal motor/sensory deficits  LABORATORY DATA: CBC Latest Ref Rng & Units 10/24/2017 09/27/2017 06/28/2017  WBC 4.0 - 10.3 K/uL 6.6 6.8 6.2  Hemoglobin 13.0 - 17.1 g/dL 17.6(H) 17.5(H) 17.8(H)  Hematocrit 38.4 - 49.9 % 53.6(H) 52.3(H) 53.3(H)  Platelets 140 - 400 K/uL 233 214 215   CMP Latest Ref Rng & Units 06/28/2017 05/19/2017 01/20/2017  Glucose 70 - 140 mg/dL 147(H) 101 129  BUN 7 - 26 mg/dL 10 10 9.7  Creatinine 0.70 - 1.30 mg/dL 1.10 1.00 1.1  Sodium 136 -   145 mmol/L 138 137 138  Potassium 3.5 - 5.1 mmol/L 4.0 4.2 4.0  Chloride 98 - 109 mmol/L 104 104 -  CO2 22 - 29 mmol/L 20(L) 21(L) 22  Calcium 8.4 - 10.4 mg/dL 10.0 9.3 9.6  Total Protein 6.4 - 8.3 g/dL 7.8 7.3 7.5  Total Bilirubin 0.2 - 1.2 mg/dL 0.7 0.6 0.84   Alkaline Phos 40 - 150 U/L 55 41 48  AST 5 - 34 U/L 25 27 21  ALT 0 - 55 U/L 17 19 13    PATHOLOGY   Diagnosis 10/24/17 Bone Marrow, Aspirate,Biopsy, and Clot - NORMOCELLULAR BONE MARROW WITH TRILINEAGE HEMATOPOIESIS. - SEE COMMENT. PERIPHERAL BLOOD: - INCREASE IN HEMOGLOBIN LEVEL ASSOCIATED WITH MACROCYTOSIS. Diagnosis Note The bone marrow is generally normocellular for age with trilineage hematopoiesis and non specific changes. Morphologic features diagnostic of a myeloid neoplasm are not seen. Correlation with cytogenetic studies is recommended. (BNS:gt, 10/25/17)    RADIOGRAPHIC STUDIES: RIGHT LOWER EXTREMITY VENOUS DUPLEX ULTRASOUND  (10/25/2011) Technique: Gray-scale sonography with graded compression, as well as color Doppler and duplex ultrasound, were performed to evaluate the deep venous system of the lower extremity from the level of the common femoral vein through the popliteal and proximal calf veins. Spectral Doppler was utilized to evaluate flow at rest and with distal augmentation maneuvers. Comparison: 06/02/2011 Findings: Residual echogenic intraluminal thrombus noted in the superficial femoral and popliteal veins. Partially occlusive thrombus persist at the saphenofemoral junction extending into the great saphenous vein. Slight interval decrease in thrombus burden throughout the right lower extremity deep veins and the superficial saphenous vein. No occlusive DVT demonstrated on today's study. IMPRESSION:  Residual partially occlusive right femoral popliteal DVT. Some interval improvement. Residual superficial thrombosis of the great saphenous vein, York interval change. Original Report Authenticated By: M. TREVOR SHICK, M.D.   ASSESSMENT: 72 y.o. male with     1. 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 JAK2 mutation (-), erythropoietin normal at 3.1, bone marrow biopsy (-) -He previously tested negative for JAK2 V617F and exon 12 mutation, his EPO level was  normal.  -We previously discussed that his polycythemia is likely secondary (to smoking). I repeatedly strongly encouraged pt to quit smoking, he has cut it down significantly but could not quit completely   -He has not had bone marrow biopsy. Due to negative JAK2, PV is much less likely, but MPN is not excluded. Bone marrow biopsy would be helpful in the diagnosis. I again encouraged him to think about it  -He does not like phlebotomy, and was started on Hydrea in 03/2013 to maintain his hct less than 47% and reduce his risk of further clots including CVA, MI and DVT. -We previously discussed that we usually do not give Hydrea for secondary polycythemia, due to its long-term side effects, especially MDS and leukemia. After a lengthy discussion, I have held his hydrea since 05/2017 -He is on Coumadin, which are significant for TSA's risk of thrombosis. -His hemoglobin and hematocrit has increased significantly since he came off Hydrea, Hg 17-18 -His 10/24/17 Bone marrow biopsy showed no evidence of MPN.  I reviewed with patient and gave him a copy of the report.  His cytogenetics are still pending.  -His Polycythemia is secondary. I think his body may be able to tolerate increased hematocrit and will be at a smaller risk for heart attack, stroke and blood clots. He is on Coumadin and has York symptoms from high blood count.  -Will continue with observation with labs every 2-3 months.   No need for treatment at this time. If Hg >20, or if he start experiencing hyperviscosity symptoms and then he may require phlebotomy.  -I strongly encouraged him to quit smoking as this will reduce or resolve his blood counts.  -I also strongly advised him to drink plenty of water to avoid dehydration. 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 -He will follow-up with his primary care physician for his PT/INR  and Coumadin dose adjustment. -He will continue Coumadin  3. Tobacco abuse -Likely contributing to #1. He is not willing to quit but has cut back.  -Repeatedly I have strongly encouraged him to stop smoking completely. His polycythemia may resolve after quit smoking and he may not need any treatment and he wants to avoid oxygen use in the future.  -We previously discussed lung cancer screening with CT chest again, he declined. -He tried smoking patch but had allergic reaction to adhesive material. He stopped using.  -He previously reduced down to 5 cigarettes a day with Chantix once a day. However now he is currently at 6-7 cigarettes a day given stress from wife's stress. -He will continue Chantix to work on completely stopping smoking.  -I again advised him to stop smoking as this will help reduce his polycythemia and miantina lung function and overall health.     4. DM -Not on medication -Monitored by his PCP Dr. [name]    Plan -I reviewed his bone marrow biopsy which was negative.  I will follow-up the genetics results.   -We will observe his secondary polycythemia, to you to be off Hydrea.   -Lab in 2-3 months -Lab and f/u in 6 months    All questions were answered. The patient knows to call the clinic with any problems, questions or concerns. We can certainly see the patient much sooner if necessary.  I spent 20 minutes counseling the patient face to face. The total time spent in the appointment was 25 minutes.  I, Amoya Bennett, am acting as scribe for Yan Feng, MD.   I have reviewed the above documentation for accuracy and completeness, and I agree with the above.     Yan Feng 11/03/2017    

## 2017-11-08 ENCOUNTER — Encounter (HOSPITAL_COMMUNITY): Payer: Self-pay | Admitting: Hematology

## 2017-12-01 ENCOUNTER — Other Ambulatory Visit: Payer: Medicare Other

## 2017-12-01 ENCOUNTER — Ambulatory Visit: Payer: Medicare Other | Admitting: Hematology

## 2018-01-03 ENCOUNTER — Inpatient Hospital Stay: Payer: Medicare Other | Attending: Hematology

## 2018-01-03 DIAGNOSIS — D751 Secondary polycythemia: Secondary | ICD-10-CM | POA: Diagnosis present

## 2018-01-03 DIAGNOSIS — F17210 Nicotine dependence, cigarettes, uncomplicated: Secondary | ICD-10-CM | POA: Insufficient documentation

## 2018-01-03 LAB — COMPREHENSIVE METABOLIC PANEL
ALT: 16 U/L (ref 0–44)
AST: 24 U/L (ref 15–41)
Albumin: 4.1 g/dL (ref 3.5–5.0)
Alkaline Phosphatase: 60 U/L (ref 38–126)
Anion gap: 11 (ref 5–15)
BILIRUBIN TOTAL: 0.5 mg/dL (ref 0.3–1.2)
BUN: 9 mg/dL (ref 8–23)
CALCIUM: 9.3 mg/dL (ref 8.9–10.3)
CO2: 25 mmol/L (ref 22–32)
CREATININE: 1.15 mg/dL (ref 0.61–1.24)
Chloride: 103 mmol/L (ref 98–111)
GFR calc Af Amer: >60 mL/min (ref >60)
GFR calc non Af Amer: >60 mL/min (ref >60)
Glucose, Bld: 151 mg/dL — ABNORMAL HIGH (ref 70–99)
Potassium: 4.3 mmol/L (ref 3.5–5.1)
Sodium: 139 mmol/L (ref 135–145)
TOTAL PROTEIN: 7.4 g/dL (ref 6.5–8.1)

## 2018-01-03 LAB — CBC WITH DIFFERENTIAL (CANCER CENTER ONLY)
Abs Immature Granulocytes: 0.01 10*3/uL (ref 0.00–0.07)
BASOS PCT: 2 %
Basophils Absolute: 0.1 10*3/uL (ref 0.0–0.1)
EOS ABS: 0.5 10*3/uL (ref 0.0–0.5)
EOS PCT: 8 %
HCT: 52.1 % — ABNORMAL HIGH (ref 39.0–52.0)
Hemoglobin: 17.2 g/dL — ABNORMAL HIGH (ref 13.0–17.0)
IMMATURE GRANULOCYTES: 0 %
Lymphocytes Relative: 35 %
Lymphs Abs: 2.1 10*3/uL (ref 0.7–4.0)
MCH: 32.5 pg (ref 26.0–34.0)
MCHC: 33 g/dL (ref 30.0–36.0)
MCV: 98.5 fL (ref 80.0–100.0)
Monocytes Absolute: 0.6 10*3/uL (ref 0.1–1.0)
Monocytes Relative: 11 %
NRBC: 0 % (ref 0.0–0.2)
Neutro Abs: 2.7 10*3/uL (ref 1.7–7.7)
Neutrophils Relative %: 44 %
PLATELETS: 229 10*3/uL (ref 150–400)
RBC: 5.29 MIL/uL (ref 4.22–5.81)
RDW: 14.2 % (ref 11.5–15.5)
WBC: 6 10*3/uL (ref 4.0–10.5)

## 2018-01-08 ENCOUNTER — Telehealth: Payer: Self-pay

## 2018-01-08 NOTE — Telephone Encounter (Signed)
Attempted to reach patient to let him know about lab results H&H slighter better, no answer and no voice mail set up.

## 2018-05-04 NOTE — Progress Notes (Signed)
Isaiah York   [phone] [phone]   Clinic Follow up Note   Patient Care Team: Isaiah Fess, MD as PCP - General (Family Medicine)  Date of Service:  05/07/2018  CHIEF COMPLAINT: Polycythemia, secondary   PREVIOUS THERAPY:  Phlebotomy to maintain a hematocrit less than 47% (has been held due to pt's preference). Hydrea 500 mg daily started on 03/25/2013 and increased to 1,000 mg daily later, and decreased back to 570m daily due to cytopenia, then reduced to 5096mdaily 5 days a week. Stopped on 05/19/17  CURRENT THERAPY:  Observation   INTERVAL HISTORY:  Isaiah York here for a follow up of polycythemia. He was last seen by me 6 months ago. She presents to the clinic today by himself. He notes he is doing well and with no new changes. He sees Dr. LiRex York coumadin levels every 6 weeks. He is still working smoking cessation. He is down to 3-5 cigarettes per day. He notes he is more stressed since his wife became sick.      REVIEW OF SYSTEMS:   Constitutional: Denies fevers, chills or abnormal weight loss Eyes: Denies blurriness of vision Ears, nose, mouth, throat, and face: Denies mucositis or sore throat Respiratory: Denies cough, dyspnea or wheezes Cardiovascular: Denies palpitation, chest discomfort or lower extremity swelling Gastrointestinal:  Denies nausea, heartburn or change in bowel habits Skin: Denies abnormal skin rashes Lymphatics: Denies new lymphadenopathy or easy bruising Neurological:Denies numbness, tingling or new weaknesses Behavioral/Psych: Mood is stable, no new changes  All other systems were reviewed with the patient and are negative.  MEDICAL HISTORY:  Past Medical History:  Diagnosis Date  . HTN (hypertension)     SURGICAL HISTORY: History reviewed. No pertinent surgical history.  I have reviewed the social history and family history with the patient and they are unchanged from previous  note.  ALLERGIES:  is allergic to tape.  MEDICATIONS:  Current Outpatient Medications  Medication Sig Dispense Refill  . amLODipine (NORVASC) 5 MG tablet Take 5 mg by mouth daily.    . folic acid (FOLVITE) 1 MG tablet Take 1 mg by mouth daily.    . Marland Kitchenosartan (COZAAR) 50 MG tablet Take 50 mg by mouth daily.    . pravastatin (PRAVACHOL) 10 MG tablet Take 10 mg by mouth daily.  3  . warfarin (COUMADIN) 2 MG tablet Take 1 tablet (2 mg total) by mouth daily at 6 PM. 30 tablet 4  . hydroxyurea (HYDREA) 500 MG capsule TAKE 1 CAPSULE DAILY EXCEPT WEDNESDAY AND SATURDAY (Patient not taking: Reported on 11/03/2017) 22 capsule 1   No current facility-administered medications for this visit.     PHYSICAL EXAMINATION: ECOG PERFORMANCE STATUS: 0 - Asymptomatic  Vitals:   05/07/18 0821  BP: (!) 143/79  Pulse: 95  Resp: 17  Temp: 97.8 F (36.6 C)  SpO2: 98%   Filed Weights   05/07/18 0821  Weight: 128 lb 12.8 oz (58.4 kg)    GENERAL:alert, no distress and comfortable SKIN: skin color, texture, turgor are normal, no rashes or significant lesions EYES: normal, Conjunctiva are pink and non-injected, sclera clear OROPHARYNX:no exudate, no erythema and lips, buccal mucosa, and tongue normal  NECK: supple, thyroid normal size, non-tender, without nodularity LYMPH:  no palpable lymphadenopathy in the cervical, axillary or inguinal LUNGS: clear to auscultation and percussion with normal breathing effort HEART: regular rate & rhythm and no murmurs and no lower extremity edema ABDOMEN:abdomen soft, non-tender and normal bowel sounds  Musculoskeletal:no cyanosis of digits and no clubbing  NEURO: alert & oriented x 3 with fluent speech, no focal motor/sensory deficits  LABORATORY DATA:  I have reviewed the data as listed CBC Latest Ref Rng & Units 05/07/2018 01/03/2018 10/24/2017  WBC 4.0 - 10.5 K/uL 5.9 6.0 6.6  Hemoglobin 13.0 - 17.0 g/dL 18.1(H) 17.2(H) 17.6(H)  Hematocrit 39.0 - 52.0 % 56.0(H)  52.1(H) 53.6(H)  Platelets 150 - 400 K/uL 246 229 233     CMP Latest Ref Rng & Units 05/07/2018 01/03/2018 06/28/2017  Glucose 70 - 99 mg/dL 139(H) 151(H) 147(H)  BUN 8 - 23 mg/dL 7(L) 9 10  Creatinine 0.61 - 1.24 mg/dL 1.20 1.15 1.10  Sodium 135 - 145 mmol/L 138 139 138  Potassium 3.5 - 5.1 mmol/L 4.7 4.3 4.0  Chloride 98 - 111 mmol/L 101 103 104  CO2 22 - 32 mmol/L 26 25 20(L)  Calcium 8.9 - 10.3 mg/dL 9.4 9.3 10.0  Total Protein 6.5 - 8.1 g/dL 7.7 7.4 7.8  Total Bilirubin 0.3 - 1.2 mg/dL 1.2 0.5 0.7  Alkaline Phos 38 - 126 U/L 59 60 55  AST 15 - 41 U/L 23 24 25   ALT 0 - 44 U/L 13 16 17       RADIOGRAPHIC STUDIES: I have personally reviewed the radiological images as listed and agreed with the findings in the report. No results found.   ASSESSMENT & PLAN:  Isaiah York is a 73 y.o. male with   1. 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 JAK2 mutation (-), erythropoietin normal at 3.1, bone marrow biopsy (-) -He previously tested negative for JAK2 V617F and exon 12 mutation, his EPO level was normal. His 10/24/17 bone marrow biopsy and cytogenics was negative.  -We previously discussed that his polycythemia is likely secondary (to smoking). I repeatedly strongly encouraged pt to quit smoking, he has cut it down significantly but could not quit completely  -He does not like phlebotomy, and was started on Hydrea in 03/2013 to maintain his hct less than 47% and reduce his risk of further clots including CVA, MI and DVT. Given his polycythemia is secondary, hydrea is not given to treat this given side effects. So he has held hydrea since 05/2017.  -His hemoglobin and hematocrit has increased significantly since he came off Hydrea, Hg 17-18. He is on Coumadin and has little symptoms from high blood count.  We discussed that risk of thrombosis from secondary polycythemia is low. -Will continue with observation with labs every 4 months. No need for treatment at this time. If Hg >20, or if  he start experiencing hyperviscosity symptoms and then he may require phlebotomy.  -F/u in 8 months then yearly.   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 -He will follow-up with his PCP, Dr. Rex Kras, for his PT/INR and Coumadin dose adjustment. He will continue Coumadin  3. Tobacco abuse  -Likely contributing to #1.  -We previously discussed lung cancer screening with CT chest again, he declined. -He tried smoking patch but had allergic reaction to adhesive material. He stopped using. He has also tried Chantix.  -I again advised him to stop smoking as this will help reduce his polycythemia and maintain lung function and overall health.  -He is down to 3-5 cigarettes a day. I strongly encouraged him to continue to quit completely.     4. DM  -Not on medication -Monitored by his PCP Dr. Rex Kras  Plan Labs today, will call him with results Lab today and 4 months  Lab and f/u in 8 months    No problem-specific Assessment & Plan notes found for this encounter.   No orders of the defined types were placed in this encounter.  All questions were answered. The patient knows to call the clinic with any problems, questions or concerns. No barriers to learning was detected. I spent 10 minutes counseling the patient face to face. The total time spent in the appointment was 15 minutes and more than 50% was on counseling and review of test results     Truitt Merle, MD 05/07/2018   I, Joslyn Devon, am acting as scribe for Truitt Merle, MD.   I have reviewed the above documentation for accuracy and completeness, and I agree with the above.

## 2018-05-07 ENCOUNTER — Telehealth: Payer: Self-pay | Admitting: Hematology

## 2018-05-07 ENCOUNTER — Encounter: Payer: Self-pay | Admitting: Hematology

## 2018-05-07 ENCOUNTER — Inpatient Hospital Stay: Payer: Medicare Other

## 2018-05-07 ENCOUNTER — Inpatient Hospital Stay: Payer: Medicare Other | Attending: Hematology | Admitting: Hematology

## 2018-05-07 ENCOUNTER — Other Ambulatory Visit: Payer: Self-pay

## 2018-05-07 VITALS — BP 143/79 | HR 95 | Temp 97.8°F | Resp 17 | Ht 66.0 in | Wt 128.8 lb

## 2018-05-07 DIAGNOSIS — D751 Secondary polycythemia: Secondary | ICD-10-CM | POA: Diagnosis not present

## 2018-05-07 DIAGNOSIS — Z79899 Other long term (current) drug therapy: Secondary | ICD-10-CM | POA: Insufficient documentation

## 2018-05-07 DIAGNOSIS — Z7901 Long term (current) use of anticoagulants: Secondary | ICD-10-CM | POA: Diagnosis not present

## 2018-05-07 DIAGNOSIS — Z720 Tobacco use: Secondary | ICD-10-CM

## 2018-05-07 DIAGNOSIS — Z86718 Personal history of other venous thrombosis and embolism: Secondary | ICD-10-CM | POA: Diagnosis not present

## 2018-05-07 DIAGNOSIS — F17210 Nicotine dependence, cigarettes, uncomplicated: Secondary | ICD-10-CM | POA: Insufficient documentation

## 2018-05-07 DIAGNOSIS — I10 Essential (primary) hypertension: Secondary | ICD-10-CM | POA: Insufficient documentation

## 2018-05-07 LAB — COMPREHENSIVE METABOLIC PANEL
ALK PHOS: 59 U/L (ref 38–126)
ALT: 13 U/L (ref 0–44)
AST: 23 U/L (ref 15–41)
Albumin: 4.2 g/dL (ref 3.5–5.0)
Anion gap: 11 (ref 5–15)
BUN: 7 mg/dL — AB (ref 8–23)
CO2: 26 mmol/L (ref 22–32)
CREATININE: 1.2 mg/dL (ref 0.61–1.24)
Calcium: 9.4 mg/dL (ref 8.9–10.3)
Chloride: 101 mmol/L (ref 98–111)
GFR calc non Af Amer: >60 mL/min (ref >60)
GLUCOSE: 139 mg/dL — AB (ref 70–99)
Potassium: 4.7 mmol/L (ref 3.5–5.1)
SODIUM: 138 mmol/L (ref 135–145)
Total Bilirubin: 1.2 mg/dL (ref 0.3–1.2)
Total Protein: 7.7 g/dL (ref 6.5–8.1)

## 2018-05-07 LAB — CBC WITH DIFFERENTIAL/PLATELET
Abs Immature Granulocytes: 0.01 10*3/uL (ref 0.00–0.07)
BASOS ABS: 0.1 10*3/uL (ref 0.0–0.1)
Basophils Relative: 2 %
EOS PCT: 5 %
Eosinophils Absolute: 0.3 10*3/uL (ref 0.0–0.5)
HEMATOCRIT: 56 % — AB (ref 39.0–52.0)
HEMOGLOBIN: 18.1 g/dL — AB (ref 13.0–17.0)
Immature Granulocytes: 0 %
LYMPHS ABS: 1.6 10*3/uL (ref 0.7–4.0)
Lymphocytes Relative: 28 %
MCH: 32.6 pg (ref 26.0–34.0)
MCHC: 32.3 g/dL (ref 30.0–36.0)
MCV: 100.7 fL — AB (ref 80.0–100.0)
MONO ABS: 0.5 10*3/uL (ref 0.1–1.0)
MONOS PCT: 9 %
Neutro Abs: 3.3 10*3/uL (ref 1.7–7.7)
Neutrophils Relative %: 56 %
Platelets: 246 10*3/uL (ref 150–400)
RBC: 5.56 MIL/uL (ref 4.22–5.81)
RDW: 14.1 % (ref 11.5–15.5)
WBC: 5.9 10*3/uL (ref 4.0–10.5)
nRBC: 0 % (ref 0.0–0.2)

## 2018-05-07 NOTE — Telephone Encounter (Signed)
Gave avs and calendar ° °

## 2018-05-08 ENCOUNTER — Encounter: Payer: Self-pay | Admitting: Hematology

## 2018-05-15 ENCOUNTER — Telehealth: Payer: Self-pay | Admitting: *Deleted

## 2018-05-15 NOTE — Telephone Encounter (Signed)
-----   Message from Truitt Merle, MD sent at 05/12/2018 10:02 AM EST ----- Please let pt know his lab results, HCT 18.1%, higher than before, encourage him to drink fluids adequately and try to quit smoking completely.   Truitt Merle  05/12/2018

## 2018-05-15 NOTE — Telephone Encounter (Signed)
Called pt & informed of lab results & encouraged oral fluid increase & to work on smoking cessation.  He reports that he has tried patches & pills but unable to quit mainly b/c he really doesn't want to.  Discussed improving his health & encouraged pt & discussed [facility] website & quit smoking classes.  He understands that he needs to.

## 2018-09-06 ENCOUNTER — Other Ambulatory Visit: Payer: Self-pay

## 2018-09-06 ENCOUNTER — Inpatient Hospital Stay: Payer: Medicare Other | Attending: Hematology

## 2018-09-06 DIAGNOSIS — Z86718 Personal history of other venous thrombosis and embolism: Secondary | ICD-10-CM | POA: Insufficient documentation

## 2018-09-06 DIAGNOSIS — F17210 Nicotine dependence, cigarettes, uncomplicated: Secondary | ICD-10-CM | POA: Insufficient documentation

## 2018-09-06 DIAGNOSIS — D751 Secondary polycythemia: Secondary | ICD-10-CM | POA: Diagnosis present

## 2018-09-06 LAB — CBC WITH DIFFERENTIAL/PLATELET
Abs Immature Granulocytes: 0.02 10*3/uL (ref 0.00–0.07)
Basophils Absolute: 0.1 10*3/uL (ref 0.0–0.1)
Basophils Relative: 2 %
Eosinophils Absolute: 0.5 10*3/uL (ref 0.0–0.5)
Eosinophils Relative: 7 %
HCT: 51.5 % (ref 39.0–52.0)
Hemoglobin: 17.2 g/dL — ABNORMAL HIGH (ref 13.0–17.0)
Immature Granulocytes: 0 %
Lymphocytes Relative: 27 %
Lymphs Abs: 1.7 10*3/uL (ref 0.7–4.0)
MCH: 32.8 pg (ref 26.0–34.0)
MCHC: 33.4 g/dL (ref 30.0–36.0)
MCV: 98.1 fL (ref 80.0–100.0)
Monocytes Absolute: 0.7 10*3/uL (ref 0.1–1.0)
Monocytes Relative: 11 %
Neutro Abs: 3.3 10*3/uL (ref 1.7–7.7)
Neutrophils Relative %: 53 %
Platelets: 273 10*3/uL (ref 150–400)
RBC: 5.25 MIL/uL (ref 4.22–5.81)
RDW: 13.8 % (ref 11.5–15.5)
WBC: 6.3 10*3/uL (ref 4.0–10.5)
nRBC: 0 % (ref 0.0–0.2)

## 2018-09-10 ENCOUNTER — Telehealth: Payer: Self-pay

## 2018-09-10 NOTE — Telephone Encounter (Signed)
Attempted to contact patient regarding lab results, no answer and no voice mail is set up.

## 2018-09-10 NOTE — Telephone Encounter (Signed)
-----   Message from Truitt Merle, MD sent at 09/09/2018  9:46 AM EDT ----- Please let pt know his CBC results, HCT better than before, no concers, encourage him to quit smoking, thanks   Truitt Merle  09/09/2018

## 2019-01-02 NOTE — Progress Notes (Signed)
[place]   Telephone:(336) [phone] Fax:(336) [phone]   Clinic Follow up Note   Patient Care Team: [name], MD as PCP - General (Family Medicine)  Date of Service:  01/07/2019  CHIEF COMPLAINT: Polycythemia, secondary    CURRENT THERAPY:  Observation  INTERVAL HISTORY:  Isaiah York is here for a follow up of Polycythemia. He was last seen by me in 8 months. He presents to the clinic alone. He notes he is doing well. He has not had any hospital visits. He notes no new changes. He was seen by his PCP last week with labs. He sees him every 6 months. He notes his coumadin was adjusted due to level being too high. He notes he does still smoke given his family still smokes. He was down to 3 cigarettes but has increased lately to 10 cigarettes a day. He notes he does not go anywhere much.    REVIEW OF SYSTEMS:   Constitutional: Denies fevers, chills or abnormal weight loss Eyes: Denies blurriness of vision Ears, nose, mouth, throat, and face: Denies mucositis or sore throat Respiratory: Denies cough, dyspnea or wheezes Cardiovascular: Denies palpitation, chest discomfort or lower extremity swelling Gastrointestinal:  Denies nausea, heartburn or change in bowel habits Skin: Denies abnormal skin rashes Lymphatics: Denies new lymphadenopathy or easy bruising Neurological:Denies numbness, tingling or new weaknesses Behavioral/Psych: Mood is stable, no new changes  All other systems were reviewed with the patient and are negative.  MEDICAL HISTORY:  Past Medical History:  Diagnosis Date   HTN (hypertension)     SURGICAL HISTORY: History reviewed. No pertinent surgical history.  I have reviewed the social history and family history with the patient and they are unchanged from previous note.  ALLERGIES:  is allergic to tape.  MEDICATIONS:  Current Outpatient Medications  Medication Sig Dispense Refill   amLODipine (NORVASC) 5 MG tablet Take 5 mg by  mouth daily.     folic acid (FOLVITE) 1 MG tablet Take 1 mg by mouth daily.     losartan (COZAAR) 50 MG tablet Take 50 mg by mouth daily.     pravastatin (PRAVACHOL) 10 MG tablet Take 10 mg by mouth daily.  3   warfarin (COUMADIN) 2 MG tablet Take 1 tablet (2 mg total) by mouth daily at 6 PM. (Patient taking differently: Take 2 mg by mouth daily at 6 PM. Taking 2 mg on Monday and Thursday and 1.5 mg all other days of week.) 30 tablet 4   No current facility-administered medications for this visit.     PHYSICAL EXAMINATION: ECOG PERFORMANCE STATUS: 1 - Symptomatic but completely ambulatory  Vitals:   01/07/19 0839  BP: 135/71  Pulse: 100  Resp: 18  Temp: 98.7 F (37.1 C)  SpO2: 100%   Filed Weights   01/07/19 0839  Weight: 129 lb 1.6 oz (58.6 kg)    GENERAL:alert, no distress and comfortable SKIN: skin color, texture, turgor are normal, no rashes or significant lesions EYES: normal, Conjunctiva are pink and non-injected, sclera clear  NECK: supple, thyroid normal size, non-tender, without nodularity LYMPH:  no palpable lymphadenopathy in the cervical, axillary  LUNGS: clear to auscultation and percussion with normal breathing effort HEART: regular rate & rhythm and no murmurs and no lower extremity edema ABDOMEN:abdomen soft, non-tender and normal bowel sounds Musculoskeletal:no cyanosis of digits and no clubbing  NEURO: alert & oriented x 3 with fluent speech, no focal motor/sensory deficits  LABORATORY DATA:  I have reviewed the data as  listed CBC Latest Ref Rng & Units 01/07/2019 09/06/2018 05/07/2018  WBC 4.0 - 10.5 K/uL 5.3 6.3 5.9  Hemoglobin 13.0 - 17.0 g/dL 17.1(H) 17.2(H) 18.1(H)  Hematocrit 39.0 - 52.0 % 50.7 51.5 56.0(H)  Platelets 150 - 400 K/uL 232 273 246     CMP Latest Ref Rng & Units 01/07/2019 05/07/2018 01/03/2018  Glucose 70 - 99 mg/dL 155(H) 139(H) 151(H)  BUN 8 - 23 mg/dL 9 7(L) 9  Creatinine 0.61 - 1.24 mg/dL 1.22 1.20 1.15  Sodium 135 - 145 mmol/L  134(L) 138 139  Potassium 3.5 - 5.1 mmol/L 4.4 4.7 4.3  Chloride 98 - 111 mmol/L 101 101 103  CO2 22 - 32 mmol/L 23 26 25   Calcium 8.9 - 10.3 mg/dL 9.4 9.4 9.3  Total Protein 6.5 - 8.1 g/dL 7.4 7.7 7.4  Total Bilirubin 0.3 - 1.2 mg/dL 0.5 1.2 0.5  Alkaline Phos 38 - 126 U/L 57 59 60  AST 15 - 41 U/L 19 23 24   ALT 0 - 44 U/L 12 13 16       RADIOGRAPHIC STUDIES: I have personally reviewed the radiological images as listed and agreed with the findings in the report. No results found.   ASSESSMENT & PLAN:  [name] is a 73 y.o. male with   1. 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 JAK2 mutation (-), erythropoietin normal at 3.1, bone marrow biopsy (-) -He previously tested negative for JAK2 V617F and exon 12 mutation, his EPO level was normal. His 10/24/17 bone marrow biopsy and cytogenics was negative.  -We previously discussed that his polycythemia is likely secondary (to smoking). I repeatedly strongly encouraged pt to quit smoking, he has cut it down significantly but could not quit completely  -He does not like phlebotomy, and was started on Hydrea in 03/2013 to maintain his hct less than 47% and reduce his risk of further clots including CVA, MI and DVT. Given his polycythemia is secondary, hydrea is not given to treat this given side effects. So he has held hydrea since 05/2017.  -His hemoglobin and hematocrit has increased significantly since he came off Hydrea, Hg 17-18. He is on Coumadin and has no symptoms from high blood count.  We discussed that risk of thrombosis from secondary polycythemia is low. -He is clinically doing well. Labs reviewed, CBC and CMP WNL except hg 17.1. No treatment is indicated at this time. I do not plan to restart Hydrea and to continue observation. I again encouraged him to stop smoking completely.  -If Hg >20,or ifhe start experiencinghyperviscositysymptoms andthen hemay require phlebotomy. He will continue Coumadin.  -Given he remains  stable on observation he is fine to be followed by his PCP. Will f/u with him as needed in the future. He is agreeable.   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 -He will follow-up with his PCP, Dr. Rex Kras, for his PT/INR and Coumadin dose adjustment. He will continue Coumadin  3. Tobacco abuse  -Likely contributing to #1.  -We previously discussed lung cancer screening with CT chest again, he declined. -He tried smoking patch but had allergic reaction to adhesive material. He stopped using. He has also tried Chantix.  -I again advised him to stop smoking as this will help reduce his polycythemia and maintain lung function and overall health. -He was previously down to 3-5 cigarettes a day. Due to COVID and him staying home with family who also smokes he has increased  smoking to 10 cigarettes a day. I encouraged him to reduce again and try to completely quit. He understands and is willing.      Plan -He is clinically stable, alb reviewed -he will follow up with Dr. Norton Pastel and have CBC repeated 1-2 times yearly  -F/u as needed in the future   No problem-specific Assessment & Plan notes found for this encounter.   No orders of the defined types were placed in this encounter.  All questions were answered. The patient knows to call the clinic with any problems, questions or concerns. No barriers to learning was detected. I spent 10 minutes counseling the patient face to face. The total time spent in the appointment was 15 minutes and more than 50% was on counseling and review of test results     Truitt Merle, MD 01/07/2019   I, Joslyn Devon, am acting as scribe for Truitt Merle, MD.   I have reviewed the above documentation for accuracy and completeness, and I agree with the above.

## 2019-01-07 ENCOUNTER — Encounter: Payer: Self-pay | Admitting: Hematology

## 2019-01-07 ENCOUNTER — Telehealth: Payer: Self-pay | Admitting: Hematology

## 2019-01-07 ENCOUNTER — Inpatient Hospital Stay: Payer: Medicare Other

## 2019-01-07 ENCOUNTER — Other Ambulatory Visit: Payer: Self-pay

## 2019-01-07 ENCOUNTER — Inpatient Hospital Stay: Payer: Medicare Other | Attending: Hematology | Admitting: Hematology

## 2019-01-07 VITALS — BP 135/71 | HR 100 | Temp 98.7°F | Resp 18 | Ht 66.0 in | Wt 129.1 lb

## 2019-01-07 DIAGNOSIS — I10 Essential (primary) hypertension: Secondary | ICD-10-CM | POA: Insufficient documentation

## 2019-01-07 DIAGNOSIS — Z7901 Long term (current) use of anticoagulants: Secondary | ICD-10-CM | POA: Diagnosis not present

## 2019-01-07 DIAGNOSIS — Z79899 Other long term (current) drug therapy: Secondary | ICD-10-CM | POA: Insufficient documentation

## 2019-01-07 DIAGNOSIS — Z86718 Personal history of other venous thrombosis and embolism: Secondary | ICD-10-CM | POA: Insufficient documentation

## 2019-01-07 DIAGNOSIS — D751 Secondary polycythemia: Secondary | ICD-10-CM

## 2019-01-07 DIAGNOSIS — F17210 Nicotine dependence, cigarettes, uncomplicated: Secondary | ICD-10-CM | POA: Insufficient documentation

## 2019-01-07 LAB — COMPREHENSIVE METABOLIC PANEL
ALT: 12 U/L (ref 0–44)
AST: 19 U/L (ref 15–41)
Albumin: 4.1 g/dL (ref 3.5–5.0)
Alkaline Phosphatase: 57 U/L (ref 38–126)
Anion gap: 10 (ref 5–15)
BUN: 9 mg/dL (ref 8–23)
CO2: 23 mmol/L (ref 22–32)
Calcium: 9.4 mg/dL (ref 8.9–10.3)
Chloride: 101 mmol/L (ref 98–111)
Creatinine, Ser: 1.22 mg/dL (ref 0.61–1.24)
GFR calc Af Amer: >60 mL/min (ref >60)
GFR calc non Af Amer: 59 mL/min — ABNORMAL LOW (ref >60)
Glucose, Bld: 155 mg/dL — ABNORMAL HIGH (ref 70–99)
Potassium: 4.4 mmol/L (ref 3.5–5.1)
Sodium: 134 mmol/L — ABNORMAL LOW (ref 135–145)
Total Bilirubin: 0.5 mg/dL (ref 0.3–1.2)
Total Protein: 7.4 g/dL (ref 6.5–8.1)

## 2019-01-07 LAB — CBC WITH DIFFERENTIAL/PLATELET
Abs Immature Granulocytes: 0.01 10*3/uL (ref 0.00–0.07)
Basophils Absolute: 0.1 10*3/uL (ref 0.0–0.1)
Basophils Relative: 1 %
Eosinophils Absolute: 0.4 10*3/uL (ref 0.0–0.5)
Eosinophils Relative: 8 %
HCT: 50.7 % (ref 39.0–52.0)
Hemoglobin: 17.1 g/dL — ABNORMAL HIGH (ref 13.0–17.0)
Immature Granulocytes: 0 %
Lymphocytes Relative: 30 %
Lymphs Abs: 1.6 10*3/uL (ref 0.7–4.0)
MCH: 32.4 pg (ref 26.0–34.0)
MCHC: 33.7 g/dL (ref 30.0–36.0)
MCV: 96 fL (ref 80.0–100.0)
Monocytes Absolute: 0.5 10*3/uL (ref 0.1–1.0)
Monocytes Relative: 10 %
Neutro Abs: 2.7 10*3/uL (ref 1.7–7.7)
Neutrophils Relative %: 51 %
Platelets: 232 10*3/uL (ref 150–400)
RBC: 5.28 MIL/uL (ref 4.22–5.81)
RDW: 14.5 % (ref 11.5–15.5)
WBC: 5.3 10*3/uL (ref 4.0–10.5)
nRBC: 0 % (ref 0.0–0.2)

## 2019-01-07 NOTE — Telephone Encounter (Signed)
Per 11/2 los F/u as needed

## 2020-03-18 DIAGNOSIS — Z7901 Long term (current) use of anticoagulants: Secondary | ICD-10-CM | POA: Diagnosis not present

## 2020-03-25 DIAGNOSIS — Z7901 Long term (current) use of anticoagulants: Secondary | ICD-10-CM | POA: Diagnosis not present

## 2020-04-08 DIAGNOSIS — Z7901 Long term (current) use of anticoagulants: Secondary | ICD-10-CM | POA: Diagnosis not present

## 2020-04-09 DIAGNOSIS — H02055 Trichiasis without entropian left lower eyelid: Secondary | ICD-10-CM | POA: Diagnosis not present

## 2020-04-09 DIAGNOSIS — H02052 Trichiasis without entropian right lower eyelid: Secondary | ICD-10-CM | POA: Diagnosis not present

## 2020-04-09 DIAGNOSIS — H04123 Dry eye syndrome of bilateral lacrimal glands: Secondary | ICD-10-CM | POA: Diagnosis not present

## 2020-04-09 DIAGNOSIS — H43813 Vitreous degeneration, bilateral: Secondary | ICD-10-CM | POA: Diagnosis not present

## 2020-05-06 DIAGNOSIS — Z7901 Long term (current) use of anticoagulants: Secondary | ICD-10-CM | POA: Diagnosis not present

## 2020-06-03 DIAGNOSIS — Z7901 Long term (current) use of anticoagulants: Secondary | ICD-10-CM | POA: Diagnosis not present

## 2021-04-16 DIAGNOSIS — Z7901 Long term (current) use of anticoagulants: Secondary | ICD-10-CM | POA: Diagnosis not present

## 2021-04-30 DIAGNOSIS — B351 Tinea unguium: Secondary | ICD-10-CM | POA: Diagnosis not present

## 2021-04-30 DIAGNOSIS — I739 Peripheral vascular disease, unspecified: Secondary | ICD-10-CM | POA: Diagnosis not present

## 2021-04-30 DIAGNOSIS — M792 Neuralgia and neuritis, unspecified: Secondary | ICD-10-CM | POA: Diagnosis not present

## 2021-04-30 DIAGNOSIS — Z7901 Long term (current) use of anticoagulants: Secondary | ICD-10-CM | POA: Diagnosis not present

## 2021-05-14 DIAGNOSIS — Z7901 Long term (current) use of anticoagulants: Secondary | ICD-10-CM | POA: Diagnosis not present

## 2021-06-10 DIAGNOSIS — Z7901 Long term (current) use of anticoagulants: Secondary | ICD-10-CM | POA: Diagnosis not present

## 2021-06-24 DIAGNOSIS — Z7901 Long term (current) use of anticoagulants: Secondary | ICD-10-CM | POA: Diagnosis not present

## 2021-07-22 DIAGNOSIS — Z7901 Long term (current) use of anticoagulants: Secondary | ICD-10-CM | POA: Diagnosis not present

## 2021-08-20 DIAGNOSIS — B351 Tinea unguium: Secondary | ICD-10-CM | POA: Diagnosis not present

## 2021-08-20 DIAGNOSIS — I739 Peripheral vascular disease, unspecified: Secondary | ICD-10-CM | POA: Diagnosis not present

## 2021-08-20 DIAGNOSIS — Z7901 Long term (current) use of anticoagulants: Secondary | ICD-10-CM | POA: Diagnosis not present

## 2021-09-17 DIAGNOSIS — Z7901 Long term (current) use of anticoagulants: Secondary | ICD-10-CM | POA: Diagnosis not present

## 2021-10-15 DIAGNOSIS — Z7901 Long term (current) use of anticoagulants: Secondary | ICD-10-CM | POA: Diagnosis not present

## 2021-11-05 DIAGNOSIS — Z7901 Long term (current) use of anticoagulants: Secondary | ICD-10-CM | POA: Diagnosis not present

## 2021-11-19 DIAGNOSIS — Z7901 Long term (current) use of anticoagulants: Secondary | ICD-10-CM | POA: Diagnosis not present

## 2021-12-03 DIAGNOSIS — Z7901 Long term (current) use of anticoagulants: Secondary | ICD-10-CM | POA: Diagnosis not present

## 2022-01-04 DIAGNOSIS — E1169 Type 2 diabetes mellitus with other specified complication: Secondary | ICD-10-CM | POA: Diagnosis not present

## 2022-01-04 DIAGNOSIS — R809 Proteinuria, unspecified: Secondary | ICD-10-CM | POA: Diagnosis not present

## 2022-01-04 DIAGNOSIS — Z0000 Encounter for general adult medical examination without abnormal findings: Secondary | ICD-10-CM | POA: Diagnosis not present

## 2022-01-04 DIAGNOSIS — D6852 Prothrombin gene mutation: Secondary | ICD-10-CM | POA: Diagnosis not present

## 2022-01-04 DIAGNOSIS — E785 Hyperlipidemia, unspecified: Secondary | ICD-10-CM | POA: Diagnosis not present

## 2022-01-04 DIAGNOSIS — D6869 Other thrombophilia: Secondary | ICD-10-CM | POA: Diagnosis not present

## 2022-01-04 DIAGNOSIS — D751 Secondary polycythemia: Secondary | ICD-10-CM | POA: Diagnosis not present

## 2022-01-04 DIAGNOSIS — I10 Essential (primary) hypertension: Secondary | ICD-10-CM | POA: Diagnosis not present

## 2022-01-04 DIAGNOSIS — J439 Emphysema, unspecified: Secondary | ICD-10-CM | POA: Diagnosis not present

## 2022-01-04 DIAGNOSIS — K219 Gastro-esophageal reflux disease without esophagitis: Secondary | ICD-10-CM | POA: Diagnosis not present

## 2022-01-04 DIAGNOSIS — Z7901 Long term (current) use of anticoagulants: Secondary | ICD-10-CM | POA: Diagnosis not present

## 2022-01-20 DIAGNOSIS — Z7901 Long term (current) use of anticoagulants: Secondary | ICD-10-CM | POA: Diagnosis not present

## 2022-02-01 DIAGNOSIS — D6852 Prothrombin gene mutation: Secondary | ICD-10-CM | POA: Diagnosis not present

## 2022-02-01 DIAGNOSIS — Z7901 Long term (current) use of anticoagulants: Secondary | ICD-10-CM | POA: Diagnosis not present

## 2022-02-22 DIAGNOSIS — Z7901 Long term (current) use of anticoagulants: Secondary | ICD-10-CM | POA: Diagnosis not present

## 2022-02-22 DIAGNOSIS — D6852 Prothrombin gene mutation: Secondary | ICD-10-CM | POA: Diagnosis not present

## 2022-03-01 DIAGNOSIS — Z7901 Long term (current) use of anticoagulants: Secondary | ICD-10-CM | POA: Diagnosis not present

## 2022-03-22 DIAGNOSIS — D6869 Other thrombophilia: Secondary | ICD-10-CM | POA: Diagnosis not present

## 2022-03-22 DIAGNOSIS — E1169 Type 2 diabetes mellitus with other specified complication: Secondary | ICD-10-CM | POA: Diagnosis not present

## 2022-03-22 DIAGNOSIS — J439 Emphysema, unspecified: Secondary | ICD-10-CM | POA: Diagnosis not present

## 2022-03-22 DIAGNOSIS — D6852 Prothrombin gene mutation: Secondary | ICD-10-CM | POA: Diagnosis not present

## 2022-03-22 DIAGNOSIS — Z7901 Long term (current) use of anticoagulants: Secondary | ICD-10-CM | POA: Diagnosis not present

## 2022-04-22 DIAGNOSIS — Z7901 Long term (current) use of anticoagulants: Secondary | ICD-10-CM | POA: Diagnosis not present

## 2022-04-22 DIAGNOSIS — D6852 Prothrombin gene mutation: Secondary | ICD-10-CM | POA: Diagnosis not present

## 2022-05-06 DIAGNOSIS — Z7901 Long term (current) use of anticoagulants: Secondary | ICD-10-CM | POA: Diagnosis not present

## 2022-06-02 DIAGNOSIS — Z7901 Long term (current) use of anticoagulants: Secondary | ICD-10-CM | POA: Diagnosis not present

## 2022-07-08 DIAGNOSIS — Z7901 Long term (current) use of anticoagulants: Secondary | ICD-10-CM | POA: Diagnosis not present

## 2022-07-08 DIAGNOSIS — E46 Unspecified protein-calorie malnutrition: Secondary | ICD-10-CM | POA: Diagnosis not present

## 2022-07-08 DIAGNOSIS — I10 Essential (primary) hypertension: Secondary | ICD-10-CM | POA: Diagnosis not present

## 2022-07-22 DIAGNOSIS — D6852 Prothrombin gene mutation: Secondary | ICD-10-CM | POA: Diagnosis not present

## 2022-07-22 DIAGNOSIS — Z7901 Long term (current) use of anticoagulants: Secondary | ICD-10-CM | POA: Diagnosis not present

## 2022-08-23 DIAGNOSIS — Z7901 Long term (current) use of anticoagulants: Secondary | ICD-10-CM | POA: Diagnosis not present

## 2022-09-30 DIAGNOSIS — E1169 Type 2 diabetes mellitus with other specified complication: Secondary | ICD-10-CM | POA: Diagnosis not present

## 2022-09-30 DIAGNOSIS — I10 Essential (primary) hypertension: Secondary | ICD-10-CM | POA: Diagnosis not present

## 2022-09-30 DIAGNOSIS — E785 Hyperlipidemia, unspecified: Secondary | ICD-10-CM | POA: Diagnosis not present

## 2022-09-30 DIAGNOSIS — Z7901 Long term (current) use of anticoagulants: Secondary | ICD-10-CM | POA: Diagnosis not present

## 2022-09-30 DIAGNOSIS — E119 Type 2 diabetes mellitus without complications: Secondary | ICD-10-CM | POA: Diagnosis not present

## 2022-11-01 DIAGNOSIS — Z7901 Long term (current) use of anticoagulants: Secondary | ICD-10-CM | POA: Diagnosis not present

## 2022-11-15 DIAGNOSIS — I10 Essential (primary) hypertension: Secondary | ICD-10-CM | POA: Diagnosis not present

## 2022-11-15 DIAGNOSIS — D6852 Prothrombin gene mutation: Secondary | ICD-10-CM | POA: Diagnosis not present

## 2022-11-15 DIAGNOSIS — Z7901 Long term (current) use of anticoagulants: Secondary | ICD-10-CM | POA: Diagnosis not present

## 2022-12-13 DIAGNOSIS — Z7901 Long term (current) use of anticoagulants: Secondary | ICD-10-CM | POA: Diagnosis not present

## 2022-12-13 DIAGNOSIS — D6852 Prothrombin gene mutation: Secondary | ICD-10-CM | POA: Diagnosis not present

## 2022-12-13 DIAGNOSIS — I10 Essential (primary) hypertension: Secondary | ICD-10-CM | POA: Diagnosis not present

## 2022-12-13 DIAGNOSIS — Z681 Body mass index (BMI) 19 or less, adult: Secondary | ICD-10-CM | POA: Diagnosis not present

## 2022-12-13 DIAGNOSIS — J439 Emphysema, unspecified: Secondary | ICD-10-CM | POA: Diagnosis not present

## 2023-02-14 DIAGNOSIS — E46 Unspecified protein-calorie malnutrition: Secondary | ICD-10-CM | POA: Diagnosis not present

## 2023-02-14 DIAGNOSIS — E785 Hyperlipidemia, unspecified: Secondary | ICD-10-CM | POA: Diagnosis not present

## 2023-02-14 DIAGNOSIS — E119 Type 2 diabetes mellitus without complications: Secondary | ICD-10-CM | POA: Diagnosis not present

## 2023-02-14 DIAGNOSIS — Z7901 Long term (current) use of anticoagulants: Secondary | ICD-10-CM | POA: Diagnosis not present

## 2023-02-14 DIAGNOSIS — D6852 Prothrombin gene mutation: Secondary | ICD-10-CM | POA: Diagnosis not present

## 2023-02-14 DIAGNOSIS — R809 Proteinuria, unspecified: Secondary | ICD-10-CM | POA: Diagnosis not present

## 2023-02-14 DIAGNOSIS — J439 Emphysema, unspecified: Secondary | ICD-10-CM | POA: Diagnosis not present

## 2023-02-14 DIAGNOSIS — I10 Essential (primary) hypertension: Secondary | ICD-10-CM | POA: Diagnosis not present

## 2023-02-14 DIAGNOSIS — Z0000 Encounter for general adult medical examination without abnormal findings: Secondary | ICD-10-CM | POA: Diagnosis not present

## 2023-02-14 DIAGNOSIS — D751 Secondary polycythemia: Secondary | ICD-10-CM | POA: Diagnosis not present

## 2023-02-14 DIAGNOSIS — E538 Deficiency of other specified B group vitamins: Secondary | ICD-10-CM | POA: Diagnosis not present

## 2023-02-14 DIAGNOSIS — E1169 Type 2 diabetes mellitus with other specified complication: Secondary | ICD-10-CM | POA: Diagnosis not present

## 2023-03-02 DIAGNOSIS — K219 Gastro-esophageal reflux disease without esophagitis: Secondary | ICD-10-CM | POA: Diagnosis not present

## 2023-03-02 DIAGNOSIS — J439 Emphysema, unspecified: Secondary | ICD-10-CM | POA: Diagnosis not present

## 2023-03-02 DIAGNOSIS — J3489 Other specified disorders of nose and nasal sinuses: Secondary | ICD-10-CM | POA: Diagnosis not present

## 2023-03-02 DIAGNOSIS — D6852 Prothrombin gene mutation: Secondary | ICD-10-CM | POA: Diagnosis not present

## 2023-03-02 DIAGNOSIS — I10 Essential (primary) hypertension: Secondary | ICD-10-CM | POA: Diagnosis not present

## 2023-03-02 DIAGNOSIS — E785 Hyperlipidemia, unspecified: Secondary | ICD-10-CM | POA: Diagnosis not present

## 2023-03-02 DIAGNOSIS — Z681 Body mass index (BMI) 19 or less, adult: Secondary | ICD-10-CM | POA: Diagnosis not present

## 2023-03-02 DIAGNOSIS — Z7901 Long term (current) use of anticoagulants: Secondary | ICD-10-CM | POA: Diagnosis not present

## 2023-03-14 DIAGNOSIS — Z7901 Long term (current) use of anticoagulants: Secondary | ICD-10-CM | POA: Diagnosis not present

## 2023-03-14 DIAGNOSIS — E1169 Type 2 diabetes mellitus with other specified complication: Secondary | ICD-10-CM | POA: Diagnosis not present

## 2023-05-11 DIAGNOSIS — R634 Abnormal weight loss: Secondary | ICD-10-CM | POA: Diagnosis not present

## 2023-05-11 DIAGNOSIS — Z681 Body mass index (BMI) 19 or less, adult: Secondary | ICD-10-CM | POA: Diagnosis not present

## 2023-05-11 DIAGNOSIS — I10 Essential (primary) hypertension: Secondary | ICD-10-CM | POA: Diagnosis not present

## 2023-05-11 DIAGNOSIS — R197 Diarrhea, unspecified: Secondary | ICD-10-CM | POA: Diagnosis not present

## 2023-05-11 DIAGNOSIS — E785 Hyperlipidemia, unspecified: Secondary | ICD-10-CM | POA: Diagnosis not present

## 2023-05-11 DIAGNOSIS — D6852 Prothrombin gene mutation: Secondary | ICD-10-CM | POA: Diagnosis not present

## 2023-05-11 DIAGNOSIS — R110 Nausea: Secondary | ICD-10-CM | POA: Diagnosis not present

## 2023-05-15 DIAGNOSIS — R197 Diarrhea, unspecified: Secondary | ICD-10-CM | POA: Diagnosis not present

## 2023-05-16 ENCOUNTER — Other Ambulatory Visit (HOSPITAL_COMMUNITY): Payer: Self-pay | Admitting: Family Medicine

## 2023-05-16 DIAGNOSIS — R197 Diarrhea, unspecified: Secondary | ICD-10-CM

## 2023-05-16 DIAGNOSIS — R1084 Generalized abdominal pain: Secondary | ICD-10-CM

## 2023-05-16 DIAGNOSIS — R110 Nausea: Secondary | ICD-10-CM

## 2023-05-16 DIAGNOSIS — R634 Abnormal weight loss: Secondary | ICD-10-CM

## 2023-05-19 ENCOUNTER — Ambulatory Visit (HOSPITAL_COMMUNITY)
Admission: RE | Admit: 2023-05-19 | Discharge: 2023-05-19 | Disposition: A | Discharge status: Home / Self Care | Source: Ambulatory Visit | Attending: Family Medicine | Admitting: Family Medicine

## 2023-05-19 DIAGNOSIS — R1084 Generalized abdominal pain: Secondary | ICD-10-CM | POA: Diagnosis not present

## 2023-05-19 DIAGNOSIS — R634 Abnormal weight loss: Secondary | ICD-10-CM | POA: Insufficient documentation

## 2023-05-19 DIAGNOSIS — R197 Diarrhea, unspecified: Secondary | ICD-10-CM | POA: Insufficient documentation

## 2023-05-19 DIAGNOSIS — R110 Nausea: Secondary | ICD-10-CM | POA: Diagnosis not present

## 2023-05-19 DIAGNOSIS — R160 Hepatomegaly, not elsewhere classified: Secondary | ICD-10-CM | POA: Diagnosis not present

## 2023-05-19 DIAGNOSIS — K5730 Diverticulosis of large intestine without perforation or abscess without bleeding: Secondary | ICD-10-CM | POA: Diagnosis not present

## 2023-06-05 ENCOUNTER — Encounter: Payer: Self-pay | Admitting: Medical Oncology

## 2023-06-07 ENCOUNTER — Encounter: Payer: Self-pay | Admitting: Medical Oncology

## 2023-06-07 NOTE — Progress Notes (Signed)
 [facility]  Outgoing call to patient's daughter, Isaiah York, to inquire if Isaiah York was available to come to appointment tomorrow at noon. Daughter stated she could not, due to her availability and said they were available anytime on Friday. Daughter Isaiah York, is managing patient's appointments. I informed her that I will check with PA-C and if there is a schedule change for Friday, I will call to notify her. I thanked her for checking her schedule. Encouraged her to call me with questions/concerns in the meantime.  Rexene Edison, RN, BSN, [facility] Oncology Nurse Navigator, [facility] 06/07/2023 1:58 PM   Outgoing call:  [facility] with Isaiah York informing her of appt change for Friday, April 4th at 11:45 AM. Asked for them to arrive 15 minutes earlier to alow for registration. Daughter thanked and encouraged to call.   Rexene Edison, RN, BSN, [facility] Oncology Nurse Navigator, [facility] 06/07/2023 2:04 PM

## 2023-06-08 NOTE — Progress Notes (Addendum)
 [facility] [phone]) [phone]   Fax:(336) [phone]  INITIAL CONSULTATION:  Patient Care Team: Catha Gosselin, MD as PCP - General (Family Medicine) Rexene Edison, RN as Oncology Nurse Navigator (Medical Oncology)  CHIEF COMPLAINTS/PURPOSE OF CONSULTATION:  Liver mass  HISTORY OF PRESENTING ILLNESS:  Isaiah York 78 y.o. male with medical history significant for recurrent DVTs, hypertension and hyperlipidemia presents to the rapid diagnostic clinic for evaluation for abnormal CT scan concerning for liver mass.  He is accompanied by his 2 daughters for this visit.  On review of the previous records, Mr. Gittins presented to PCP on 05/15/2022 for nausea, weight loss, diarrhea and generalized abdominal pain. He underwent CT scan of the abdomen and pelvis on 05/19/2023 which revealed a dominant 13.2 cm mass replacing majority of the right lobe of the liver.   On exam today, Mr. Jividen reports that he presented with abdominal pain for two months before undergoing CT imaging.  In addition, he was having postprandial abdominal pain which resulted in approximately a 20 pound weight loss.  He was struggling with abnormal bowel changes alternating from diarrhea versus constipation.  In the last couple of days he reports his pain and bowel habits have improved.  He is not requiring any pain medication at this time.  He is eating better and has gained proximately 10 pounds over the last few days.  He denies any changes to his energy levels and can complete his baseline ADLs on his own.  He does easily bruise secondary to chronic warfarin use.  He denies any overt signs of bleeding including hematochezia, melena or hematuria.  He denies fevers, chills, sweats, shortness of breath, chest pain or cough.  He has no complaints. Rest of the 10 ROS as below.  MEDICAL HISTORY:  Past Medical History:  Diagnosis Date   DVT (deep venous thrombosis) (HCC)    HTN  (hypertension)    Hyperlipidemia     SURGICAL HISTORY: History reviewed. No pertinent surgical history.  SOCIAL HISTORY: Social History   Socioeconomic History   Marital status: Married    Spouse name: Not on file   Number of children: Not on file   Years of education: Not on file   Highest education level: Not on file  Occupational History   Not on file  Tobacco Use   Smoking status: Every Day    Current packs/day: 0.50    Average packs/day: 0.5 packs/day for 54.0 years (27.0 ttl pk-yrs)    Types: Cigarettes   Smokeless tobacco: Never  Substance and Sexual Activity   Alcohol use: Yes    Comment: 3 beers per day now, prior to 6 pack per day   Drug use: No   Sexual activity: Not on file  Other Topics Concern   Not on file  Social History Narrative   Not on file   Social Drivers of Health   Financial Resource Strain: Not on file  Food Insecurity: Not on file  Transportation Needs: Not on file  Physical Activity: Not on file  Stress: Not on file  Social Connections: Not on file  Intimate Partner Violence: Not on file    FAMILY HISTORY: Family History  Problem Relation Age of Onset   Lung cancer Mother        smoker   Stroke Father    Stomach cancer Maternal Grandmother     ALLERGIES:  is allergic to tape.  MEDICATIONS:  Current Outpatient Medications  Medication Sig Dispense  Refill   amLODipine (NORVASC) 5 MG tablet Take 5 mg by mouth daily.     folic acid (FOLVITE) 1 MG tablet Take 1 mg by mouth daily.     losartan (COZAAR) 50 MG tablet Take 50 mg by mouth daily.     pravastatin (PRAVACHOL) 10 MG tablet Take 10 mg by mouth daily.  3   warfarin (COUMADIN) 2 MG tablet Take 1 tablet (2 mg total) by mouth daily at 6 PM. (Patient taking differently: Take 2 mg by mouth daily at 6 PM. Taking 2 mg on Monday and Thursday and 1.5 mg all other days of week.) 30 tablet 4   No current facility-administered medications for this visit.    REVIEW OF SYSTEMS:    Constitutional: ( - ) fevers, ( - )  chills , ( - ) night sweats Eyes: ( - ) blurriness of vision, ( - ) double vision, ( - ) watery eyes Ears, nose, mouth, throat, and face: ( - ) mucositis, ( - ) sore throat Respiratory: ( - ) cough, ( - ) dyspnea, ( - ) wheezes Cardiovascular: ( - ) palpitation, ( - ) chest discomfort, ( - ) lower extremity swelling Gastrointestinal:  ( - ) nausea, ( - ) heartburn, ( - ) change in bowel habits Skin: ( - ) abnormal skin rashes Lymphatics: ( - ) new lymphadenopathy, ( - ) easy bruising Neurological: ( - ) numbness, ( - ) tingling, ( - ) new weaknesses Behavioral/Psych: ( - ) mood change, ( - ) new changes  All other systems were reviewed with the patient and are negative.  PHYSICAL EXAMINATION: ECOG PERFORMANCE STATUS: 1 - Symptomatic but completely ambulatory  Vitals:   06/09/23 1215  BP: 132/64  Pulse: 90  Resp: 16  Temp: 98.1 F (36.7 C)  SpO2: 100%   Filed Weights   06/09/23 1215  Weight: 102 lb 4.8 oz (46.4 kg)    GENERAL: elderly appearing male in NAD  SKIN: skin color, texture, turgor are normal, no rashes or significant lesions EYES: conjunctiva are pink and non-injected, sclera clear LYMPH:  no palpable lymphadenopathy in the cervical or supraclavicular lymph nodes.  LUNGS: clear to auscultation and percussion with normal breathing effort HEART: regular rate & rhythm and no murmurs and no lower extremity edema ABDOMEN: soft, non-tender, non-distended, normal bowel sounds Musculoskeletal: no cyanosis of digits and no clubbing  PSYCH: alert & oriented x 3, fluent speech NEURO: no focal motor/sensory deficits  LABORATORY DATA:  I have reviewed the data as listed    Latest Ref Rng & Units 06/09/2023    1:03 PM 01/07/2019    8:24 AM 09/06/2018    8:38 AM  CBC  WBC 4.0 - 10.5 K/uL 9.6  5.3  6.3   Hemoglobin 13.0 - 17.0 g/dL 16.1  09.6  04.5   Hematocrit 39.0 - 52.0 % 31.3  50.7  51.5   Platelets 150 - 400 K/uL 367  232  273         Latest Ref Rng & Units 06/09/2023    1:03 PM 01/07/2019    8:24 AM 05/07/2018    8:53 AM  CMP  Glucose 70 - 99 mg/dL 409  811  914   BUN 8 - 23 mg/dL 15  9  7    Creatinine 0.61 - 1.24 mg/dL 7.82  9.56  2.13   Sodium 135 - 145 mmol/L 139  134  138   Potassium 3.5 - 5.1 mmol/L 4.2  4.4  4.7   Chloride 98 - 111 mmol/L 105  101  101   CO2 22 - 32 mmol/L 29  23  26    Calcium 8.9 - 10.3 mg/dL 9.0  9.4  9.4   Total Protein 6.5 - 8.1 g/dL 6.7  7.4  7.7   Total Bilirubin 0.0 - 1.2 mg/dL 0.8  0.5  1.2   Alkaline Phos 38 - 126 U/L 75  57  59   AST 15 - 41 U/L 24  19  23    ALT 0 - 44 U/L 13  12  13       RADIOGRAPHIC STUDIES: I have personally reviewed the radiological images as listed and agreed with the findings in the report. CT ABDOMEN PELVIS WO CONTRAST Result Date: 06/01/2023 CLINICAL DATA:  Nausea. Weight loss. Generalized abdominal pain. * Tracking Code: BO * EXAM: CT ABDOMEN AND PELVIS WITHOUT CONTRAST TECHNIQUE: Multidetector CT imaging of the abdomen and pelvis was performed following the standard protocol without IV contrast. RADIATION DOSE REDUCTION: This exam was performed according to the departmental dose-optimization program which includes automated exposure control, adjustment of the mA and/or kV according to patient size and/or use of iterative reconstruction technique. COMPARISON:  None Available. FINDINGS: Lower chest: Mild right base scarring. Mild centrilobular emphysema. Normal heart size without pericardial or pleural effusion. Multivessel coronary artery atherosclerosis. Anterior right lung base calcified granuloma. Hepatobiliary: No evidence of cirrhosis.  Hepatomegaly at 19.2 cm. A heterogeneous mass replaces the majority of the right lobe of the liver. Example 12.1 x 13.2 cm on 11/02. 11.5 cm craniocaudal on coronal image 41. Area of hyperattenuation within anteriorly measures 3.1 cm on 12/02. Possible dependent gallstone without acute cholecystitis or biliary duct  dilatation. Pancreas: Normal, without mass or ductal dilatation. Spleen: Old granulomatous disease in the spleen.  No splenomegaly. Adrenals/Urinary Tract: Normal adrenal glands. Bilateral punctate renal collecting system calculi. Mild renal cortical thinning bilaterally. No hydronephrosis. No bladder calculi. Stomach/Bowel: Normal stomach, without wall thickening. Scattered colonic diverticula. Normal terminal ileum. Normal small bowel. Vascular/Lymphatic: Advanced aortic and branch vessel atherosclerosis. Right common iliac artery ectasia at 1.9 cm. No abdominopelvic adenopathy. Reproductive: Mild prostatomegaly. Other: No significant free fluid. No evidence of omental or peritoneal disease. Musculoskeletal: Osteopenia. IMPRESSION: 1. Dominant 13.2 cm mass replacing the majority of the right lobe of the liver. Differential considerations include isolated metastasis from an unknown primary or hepatocellular carcinoma. Areas of heterogeneity and hyperattenuation within could represent superimposed hemorrhage as can be seen with melanoma or other hemorrhagic primary metastasis. Recommend multidisciplinary oncologic consultation for tissue sampling versus further imaging with PET to direct sampling. 2. No primary extrahepatic malignancy identified. 3. Aortic atherosclerosis (ICD10-I70.0), coronary artery atherosclerosis and emphysema (ICD10-J43.9). Right common iliac artery ectasia. 4. Bilateral nonobstructive nephrolithiasis. 5. Possible cholelithiasis. These results will be called to the ordering clinician or representative by the Radiologist Assistant, and communication documented in the PACS or [employer]. Electronically Signed   By: Jeronimo Greaves M.D.   On: 06/01/2023 11:52    ASSESSMENT & PLAN MYER BOHLMAN is a 78 y.o. presents to the rapid diagnostic clinic for evaluation for abnormal CT scan concerning for large liver mass.  We reviewed the CT imaging in person and discussed possible etiologies  including potential malignancies such as hepatocellular carcinoma, intrahepatic cholangiocarcinoma versus metastatic disease to the liver.  Patient will proceed with laboratory evaluation today and then determine whether additional diagnostic testing is required.  Patient expressed understanding of the plan provided and would  like to move forward with the workup.  #Large liver mass: --Seen on CT abdomen/pelvis on 05/19/2023.  --Labs today to check CBC, CMP, AFP, CEA and CA19-9 -- If AFP levels are elevated, we will proceed with MRI of the abdomen/pelvis plus CT chest to evaluate for HCC.  Otherwise, we will order a PET CT scan for further staging and arrange for a liver biopsy for tissue diagnosis. --RTC once workup is complete  #Abdominal pain #Weight loss -- Patient denies any need for pain medication at this time.  Advised to closely monitor and follow-up with clinic if he requires any pain medication. -- P.o. intake is improving along with weight gain.  Monitor closely for now.  #Alcohol consumption #Tobacco Use -- Encouraged alcohol and tobacco cessation.  Orders Placed This Encounter  Procedures   CBC with Differential (Cancer Center Only)    Standing Status:   Future    Number of Occurrences:   1    Expiration Date:   06/08/2024   CMP (Cancer Center only)    Standing Status:   Future    Number of Occurrences:   1    Expiration Date:   06/08/2024   AFP tumor marker    Standing Status:   Future    Number of Occurrences:   1    Expiration Date:   06/08/2024   CA 19.9    Standing Status:   Future    Number of Occurrences:   1    Expiration Date:   06/08/2024   CEA (Access)-[facility] ONLY    Standing Status:   Future    Number of Occurrences:   1    Expiration Date:   06/08/2024    All questions were answered. The patient knows to call the clinic with any problems, questions or concerns.  I have spent a total of 60 minutes minutes of face-to-face and non-face-to-face time, preparing to  see the patient, obtaining and/or reviewing separately obtained history, performing a medically appropriate examination, counseling and educating the patient, ordering medications/tests/procedures, referring and communicating with other health care professionals, documenting clinical information in the electronic health record, independently interpreting results and communicating results to the patient, and care coordination.   Georga Kaufmann, PA-C Department of Hematology/Oncology [facility] at [facility] Phone: [phone]  Patient was seen with Dr. Mosetta Putt.   Addendum I have seen the patient, examined him. I agree with the assessment and and plan and have edited the notes.   Patient was previously seen by me for secondary polycythemia and was discharged from my clinic.he was referred back for newly discovered liver mass.  He does drink alcohol moderately.  I personally reviewed his CT scan from May 19, 2022, which showed a 13.2 cm mass replacing the majority of the right lobe of liver, no nodal or distant metastasis seen on the CT scan.  No evidence of cirrhosis seen on the CT.  Will obtain tumor markers today including AFP, CA 19.9, CEA.  If he has elevated AFP, will obtain liver MRI and CT chest.  If not, will obtain PET scan for further evaluation.  Will decide if he needs liver biopsy based on the above workup result.  I answered all patient's questions.  I spent a total of 45 minutes for his visit today, more than 50% time on face-to-face counseling.  Malachy Mood  06/09/2023

## 2023-06-09 ENCOUNTER — Encounter: Payer: Self-pay | Admitting: Physician Assistant

## 2023-06-09 ENCOUNTER — Ambulatory Visit: Admitting: Physician Assistant

## 2023-06-09 ENCOUNTER — Other Ambulatory Visit

## 2023-06-09 ENCOUNTER — Inpatient Hospital Stay: Attending: Physician Assistant | Admitting: Physician Assistant

## 2023-06-09 ENCOUNTER — Encounter: Payer: Self-pay | Admitting: Medical Oncology

## 2023-06-09 ENCOUNTER — Inpatient Hospital Stay

## 2023-06-09 VITALS — BP 132/64 | HR 90 | Temp 98.1°F | Resp 16 | Wt 102.3 lb

## 2023-06-09 DIAGNOSIS — R160 Hepatomegaly, not elsewhere classified: Secondary | ICD-10-CM

## 2023-06-09 DIAGNOSIS — Z800 Family history of malignant neoplasm of digestive organs: Secondary | ICD-10-CM | POA: Insufficient documentation

## 2023-06-09 DIAGNOSIS — R634 Abnormal weight loss: Secondary | ICD-10-CM | POA: Diagnosis not present

## 2023-06-09 DIAGNOSIS — F17210 Nicotine dependence, cigarettes, uncomplicated: Secondary | ICD-10-CM | POA: Diagnosis not present

## 2023-06-09 DIAGNOSIS — R109 Unspecified abdominal pain: Secondary | ICD-10-CM | POA: Insufficient documentation

## 2023-06-09 DIAGNOSIS — R772 Abnormality of alphafetoprotein: Secondary | ICD-10-CM | POA: Diagnosis not present

## 2023-06-09 DIAGNOSIS — Z801 Family history of malignant neoplasm of trachea, bronchus and lung: Secondary | ICD-10-CM | POA: Diagnosis not present

## 2023-06-09 DIAGNOSIS — Z86718 Personal history of other venous thrombosis and embolism: Secondary | ICD-10-CM | POA: Diagnosis not present

## 2023-06-09 LAB — CBC WITH DIFFERENTIAL (CANCER CENTER ONLY)
Abs Immature Granulocytes: 0.04 10*3/uL (ref 0.00–0.07)
Basophils Absolute: 0.1 10*3/uL (ref 0.0–0.1)
Basophils Relative: 1 %
Eosinophils Absolute: 0.2 10*3/uL (ref 0.0–0.5)
Eosinophils Relative: 2 %
HCT: 31.3 % — ABNORMAL LOW (ref 39.0–52.0)
Hemoglobin: 10.3 g/dL — ABNORMAL LOW (ref 13.0–17.0)
Immature Granulocytes: 0 %
Lymphocytes Relative: 19 %
Lymphs Abs: 1.8 10*3/uL (ref 0.7–4.0)
MCH: 31.6 pg (ref 26.0–34.0)
MCHC: 32.9 g/dL (ref 30.0–36.0)
MCV: 96 fL (ref 80.0–100.0)
Monocytes Absolute: 0.8 10*3/uL (ref 0.1–1.0)
Monocytes Relative: 8 %
Neutro Abs: 6.7 10*3/uL (ref 1.7–7.7)
Neutrophils Relative %: 70 %
Platelet Count: 367 10*3/uL (ref 150–400)
RBC: 3.26 MIL/uL — ABNORMAL LOW (ref 4.22–5.81)
RDW: 17.4 % — ABNORMAL HIGH (ref 11.5–15.5)
WBC Count: 9.6 10*3/uL (ref 4.0–10.5)
nRBC: 0 % (ref 0.0–0.2)

## 2023-06-09 LAB — CMP (CANCER CENTER ONLY)
ALT: 13 U/L (ref 0–44)
AST: 24 U/L (ref 15–41)
Albumin: 3.9 g/dL (ref 3.5–5.0)
Alkaline Phosphatase: 75 U/L (ref 38–126)
Anion gap: 5 (ref 5–15)
BUN: 15 mg/dL (ref 8–23)
CO2: 29 mmol/L (ref 22–32)
Calcium: 9 mg/dL (ref 8.9–10.3)
Chloride: 105 mmol/L (ref 98–111)
Creatinine: 1.03 mg/dL (ref 0.61–1.24)
GFR, Estimated: >60 mL/min (ref >60)
Glucose, Bld: 118 mg/dL — ABNORMAL HIGH (ref 70–99)
Potassium: 4.2 mmol/L (ref 3.5–5.1)
Sodium: 139 mmol/L (ref 135–145)
Total Bilirubin: 0.8 mg/dL (ref 0.0–1.2)
Total Protein: 6.7 g/dL (ref 6.5–8.1)

## 2023-06-09 LAB — CEA (ACCESS): CEA (CHCC): 2.17 ng/mL (ref 0.00–5.00)

## 2023-06-09 NOTE — Progress Notes (Signed)
 [facility]  Patient presented to clinic, with his two daughters, for his scheduled appointment with Georga Kaufmann, PA-C. I introduced myself and provided his daughter's with my direct contact information. Patient and daughters were encouraged to call me with questions/concerns.   Gregary Cromer, RN, BSN, [facility] Oncology Nurse Navigator, [facility] 06/09/2023 1:36 PM

## 2023-06-09 NOTE — Patient Instructions (Addendum)
 Diagnostic Clinic Office Visit Discharge Information and Instructions  Thank you for choosing [facility] for your healthcare needs.  Below is a summary of today's discussion, along with our contact information and an outline of what to expect next.  Reason for Visit:  Liver mass  Proposed Diagnostic Care Plan: Labs today Additional imaging based on lab results (MRI abdomen/pelvis + CT chest versus PET/CT) Possible liver biopsy.  What to Expect: - Generally, when lab tests are ordered the results can take up to 1 week for results to be available.  At that point, we will contact you to discuss your results with you.  Unless there is a critical result, we will typically wait for all of your lab results to be available before contacting you. - If a biopsy is part of your Care Plan, those results can take on average 7-10 days to result.  Once results are available, we will contact you to discuss your pathology results and any next steps. - If you have additional imaging ordered, such as a CT Scan, MRI, Ultrasound, Bone Scan, or PET scan, your imaging will need to be authorized then scheduled with the earliest available appointment.  You may be asked to travel to another hospital within [facility] who has a sooner availability, please consider doing so if asked. - If you use MyChart, your results will be available to you in the MyChart portal.  Your provider will be in touch with you as soon as all of your results are available to be discussed.  Your Diagnostic Clinic Provider:  Georga Kaufmann PA-C and Dr. Malachy Mood, contact number [phone] Your Diagnostic Navigator:  Chauncy Lean, RN, contact number [phone]  If you or your caregiver have number blocking on your cell phones, please ensure the cancer center's numbers are not blocked.  If you are not a registered MyChart user, please consider enrolling in MyChart to receive your test results and visit notes.  You can also access your  discharge instructions electronically.  MyChart also gives you an electronic means to communicate with your Care Team instead of needing to call in to the cancer center.  We appreciate you trusting [place] with your healthcare and look forward to partnering with you as we work to uncover what your potential diagnosis may be.  Please do not hesitate to reach out at any point with questions or concerns.

## 2023-06-10 LAB — CANCER ANTIGEN 19-9: CA 19-9: 20 U/mL (ref 0–35)

## 2023-06-10 LAB — AFP TUMOR MARKER: AFP, Serum, Tumor Marker: 31.8 ng/mL — ABNORMAL HIGH (ref 0.0–8.4)

## 2023-06-12 ENCOUNTER — Other Ambulatory Visit: Payer: Self-pay | Admitting: Physician Assistant

## 2023-06-12 ENCOUNTER — Encounter: Payer: Self-pay | Admitting: Medical Oncology

## 2023-06-12 DIAGNOSIS — R160 Hepatomegaly, not elsewhere classified: Secondary | ICD-10-CM

## 2023-06-12 NOTE — Progress Notes (Signed)
 [facility]  Call to patient's daughter, Tresa Endo, to inform her that, per Earlie Counts, tumor marker resulted elevated and that "which suggests primary liver cancer (HCC)" and that Karena Addison has ordered an MRI of the liver and a Chest CT.  Also that a biopsy may not be needed, pending the MRI results. Tresa Endo gave her verbal understanding. I inquired what their  availability is so I may schedule the imaging. Tresa Endo stated that the soonest availability would work.   Call back to [facility] to inform her that Mr. Mera is scheduled here at [facility] for the MRI tomorrow (4/8) @ 0830 and the CT will be right after, with a check-in time at 0815. Mr. Lightsey is to not eat or drink anything four hours before his 0830 appointment . Tresa Endo confirmed understanding of appointment times and instructions, denies further questions at this time and was encouraged to call with questions/concerns, in the meantime.   Gregary Cromer, RN, BSN, [facility] Oncology Nurse Navigator, [facility] 06/12/2023 2:13 PM

## 2023-06-13 ENCOUNTER — Ambulatory Visit (HOSPITAL_COMMUNITY)
Admission: RE | Admit: 2023-06-13 | Discharge: 2023-06-13 | Discharge status: Home / Self Care | Source: Ambulatory Visit | Attending: Physician Assistant

## 2023-06-13 ENCOUNTER — Ambulatory Visit (HOSPITAL_COMMUNITY)
Admission: RE | Admit: 2023-06-13 | Discharge: 2023-06-13 | Disposition: A | Discharge status: Home / Self Care | Source: Ambulatory Visit | Attending: Physician Assistant | Admitting: Physician Assistant

## 2023-06-13 ENCOUNTER — Encounter: Payer: Self-pay | Admitting: Medical Oncology

## 2023-06-13 DIAGNOSIS — R160 Hepatomegaly, not elsewhere classified: Secondary | ICD-10-CM

## 2023-06-13 DIAGNOSIS — I700 Atherosclerosis of aorta: Secondary | ICD-10-CM | POA: Diagnosis not present

## 2023-06-13 DIAGNOSIS — R918 Other nonspecific abnormal finding of lung field: Secondary | ICD-10-CM | POA: Diagnosis not present

## 2023-06-13 DIAGNOSIS — C229 Malignant neoplasm of liver, not specified as primary or secondary: Secondary | ICD-10-CM | POA: Diagnosis not present

## 2023-06-13 MED ORDER — SODIUM CHLORIDE (PF) 0.9 % IJ SOLN
INTRAMUSCULAR | Status: AC
Start: 2023-06-13 — End: ?
  Filled 2023-06-13: qty 50

## 2023-06-13 MED ORDER — GADOBUTROL 1 MMOL/ML IV SOLN
5.0000 mL | Freq: Once | INTRAVENOUS | Status: AC | PRN
  Administered 2023-06-13: 5 mL via INTRAVENOUS

## 2023-06-13 MED ORDER — IOHEXOL 300 MG/ML  SOLN
75.0000 mL | Freq: Once | INTRAMUSCULAR | Status: AC | PRN
  Administered 2023-06-13: 75 mL via INTRAVENOUS

## 2023-06-13 NOTE — Progress Notes (Signed)
  R[facility] Diagnostic Clinic for Malignancies [facility]  Diagnostic Nurse Navigator Treatment Team Hand-Off Note  06/13/23  Patient Name:  Isaiah York Patient MRN:  [id number] Patient DOB:  May 19, 1945   Patient Care Team: Catha Gosselin, MD as PCP - General (Family Medicine) Rexene Edison, RN as Oncology Nurse Navigator (Medical Oncology)  Chief Complaint Liver Mass  Oncology History   No history exists.    Cancer Staging  No matching staging information was found for the patient.   SDOH Screening and Interventions Updated:  No  SDOH Screenings   Tobacco Use: High Risk (06/09/2023)     Genetics Assessment Completed:  No Genetics Referral Made:  no  Care Team Updated:  Yes  Gregary Cromer, RN, BSN, [facility] Oncology Nurse Navigator, [facility] 06/13/2023 10:38 AM

## 2023-06-13 NOTE — Progress Notes (Signed)
 [facility]  LVM with patient's daughter, Tresa Endo, informing her of appointment with Dr. Mosetta Putt 06/15/23 @ 10:20 AM.  Call back number provided for questions.   Gregary Cromer, RN, BSN, [facility] Oncology Nurse Navigator, [facility] 06/13/2023 10:17 AM

## 2023-06-14 DIAGNOSIS — C220 Liver cell carcinoma: Secondary | ICD-10-CM | POA: Insufficient documentation

## 2023-06-15 ENCOUNTER — Inpatient Hospital Stay: Admitting: Hematology

## 2023-06-15 ENCOUNTER — Other Ambulatory Visit: Payer: Self-pay

## 2023-06-15 VITALS — BP 128/70 | HR 92 | Temp 98.8°F | Resp 17 | Wt 107.2 lb

## 2023-06-15 DIAGNOSIS — C220 Liver cell carcinoma: Secondary | ICD-10-CM

## 2023-06-15 DIAGNOSIS — Z801 Family history of malignant neoplasm of trachea, bronchus and lung: Secondary | ICD-10-CM | POA: Diagnosis not present

## 2023-06-15 DIAGNOSIS — R634 Abnormal weight loss: Secondary | ICD-10-CM | POA: Diagnosis not present

## 2023-06-15 DIAGNOSIS — F17210 Nicotine dependence, cigarettes, uncomplicated: Secondary | ICD-10-CM | POA: Diagnosis not present

## 2023-06-15 DIAGNOSIS — R160 Hepatomegaly, not elsewhere classified: Secondary | ICD-10-CM | POA: Diagnosis not present

## 2023-06-15 DIAGNOSIS — R109 Unspecified abdominal pain: Secondary | ICD-10-CM | POA: Diagnosis not present

## 2023-06-15 DIAGNOSIS — Z86718 Personal history of other venous thrombosis and embolism: Secondary | ICD-10-CM | POA: Diagnosis not present

## 2023-06-15 DIAGNOSIS — Z800 Family history of malignant neoplasm of digestive organs: Secondary | ICD-10-CM | POA: Diagnosis not present

## 2023-06-15 DIAGNOSIS — R772 Abnormality of alphafetoprotein: Secondary | ICD-10-CM | POA: Diagnosis not present

## 2023-06-15 NOTE — Progress Notes (Signed)
 [facility]   [phone] [phone]   Clinic Follow up Note   Patient Care Team: Catha Gosselin, MD as PCP - General (Family Medicine)  Date of Service:  06/15/2023  CHIEF COMPLAINT: f/u of HCC   CURRENT THERAPY:  Pending   Assessment & Plan Hepatocellular carcinoma (HCC) Newly diagnosed HCC with a large tumor (18x13 cm) in the right liver lobe, elevated AFP at 31.8, and no metastasis on CT chest. Small benign lung nodules present. No gross liver cirrhosis, but significant alcohol use history.  Based on the MRI and elevated AFP, his diagnosis HCC is pretty certain, but will review in tumor board next week to see if biopsy is still needed.  Differential includes cholangiocarcinoma but less likely due to AFP elevation and normal CA 19.9.  -Treatment options discussed: surgical resection, liver transplant not an option due to large size, TACE, Y90, immunotherapy, and biological agents. Surgery is high risk due to tumor size and potential liver function post-resection. Without treatment, prognosis is poor. Immunotherapy offers a 40-50% response rate, with potential benefits in 70% of cases. He prefers non-surgical options but is open to surgical evaluation if necessary, expressing concern about surgical risks. - Present case at tumor board for multidisciplinary input. - Consider initiating immunotherapy and liver targeted therapy such as embolization pending surgical evaluation. - Order hepatitis B/C testing. - Advise cessation of alcohol and smoking to improve surgical candidacy and health. - Encourage high protein, high calorie diet and physical activity to enhance treatment tolerance.  Alcohol use disorder Heavy alcohol use reduced to three beers daily. No withdrawal symptoms. Alcohol contributes to liver damage and affects HCC treatment options. Complete cessation is essential for liver transplant candidacy and treatment improvement. - Advise complete  alcohol cessation to prevent further liver damage and enhance treatment outcomes.  Smoking history Significant smoking history, no COPD diagnosis. Smoking may affect surgical candidacy and recovery. Cessation is advised for health and treatment improvement. - Advise smoking reduction and cessation to improve surgical candidacy and health.  Nutritional status Underweight status may affect surgical candidacy and treatment tolerance. Nutritional intake improvement is encouraged for health support. - Encourage high protein, high calorie diet to improve nutritional status.  PLAN -I reviewed his CT chest, abdominal MRI scan images and findings with patient and his daughters in detail. -Also reviewed lab, diagnosis, prognosis and treatment options. -Case will be presented in tumor board next week. -Will likely refer him to IR for liver targeted therapy such as Y90. -I recommend systemic therapy atezolizumab and bevacizumab if he is not a candidate for surgery.   Discussed the use of AI scribe software for clinical note transcription with the patient, who gave verbal consent to proceed.  History of Present Illness A 78 year old patient with a recent diagnosis of hepatocellular carcinoma (HCC) presents for a follow-up visit. The patient reports feeling "about the same" since the last visit and denies any new symptoms. The patient has not experienced any abdominal pain for the past two weeks and reports a normal appetite. Bowel movements are regular, and there are no reports of fever, chills, or nausea. The patient has a history of heavy alcohol consumption and smoking, which are potential risk factors for liver disease and cancer. The patient has not been previously tested for hepatitis C, but denies any symptoms suggestive of the disease. The patient is underweight and has a history of significant alcohol and tobacco use.     All other systems were reviewed with the  patient and are  negative.  MEDICAL HISTORY:  Past Medical History:  Diagnosis Date   DVT (deep venous thrombosis) (HCC)    HTN (hypertension)    Hyperlipidemia     SURGICAL HISTORY: No past surgical history on file.  I have reviewed the social history and family history with the patient and they are unchanged from previous note.  ALLERGIES:  is allergic to tape.  MEDICATIONS:  Current Outpatient Medications  Medication Sig Dispense Refill   amLODipine (NORVASC) 5 MG tablet Take 5 mg by mouth daily.     folic acid (FOLVITE) 1 MG tablet Take 1 mg by mouth daily.     losartan (COZAAR) 50 MG tablet Take 50 mg by mouth daily.     pravastatin (PRAVACHOL) 10 MG tablet Take 10 mg by mouth daily.  3   warfarin (COUMADIN) 2 MG tablet Take 1 tablet (2 mg total) by mouth daily at 6 PM. (Patient taking differently: Take 2 mg by mouth daily at 6 PM. Taking 2 mg on Monday and Thursday and 1.5 mg all other days of week.) 30 tablet 4   No current facility-administered medications for this visit.    PHYSICAL EXAMINATION: ECOG PERFORMANCE STATUS: 1 - Symptomatic but completely ambulatory  Vitals:   06/15/23 1033  BP: 128/70  Pulse: 92  Resp: 17  Temp: 98.8 F (37.1 C)  SpO2: 99%   Wt Readings from Last 3 Encounters:  06/15/23 107 lb 3.2 oz (48.6 kg)  06/09/23 102 lb 4.8 oz (46.4 kg)  01/07/19 129 lb 1.6 oz (58.6 kg)     GENERAL:alert, no distress and comfortable SKIN: skin color, texture, turgor are normal, no rashes or significant lesions EYES: normal, Conjunctiva are pink and non-injected, sclera clear NECK: supple, thyroid normal size, non-tender, without nodularity LYMPH:  no palpable lymphadenopathy in the cervical, axillary  LUNGS: clear to auscultation and percussion with normal breathing effort HEART: regular rate & rhythm and no murmurs and no lower extremity edema ABDOMEN:abdomen soft, non-tender and normal bowel sounds Musculoskeletal:no cyanosis of digits and no clubbing  NEURO:  alert & oriented x 3 with fluent speech, no focal motor/sensory deficits  Physical Exam    LABORATORY DATA:  I have reviewed the data as listed    Latest Ref Rng & Units 06/09/2023    1:03 PM 01/07/2019    8:24 AM 09/06/2018    8:38 AM  CBC  WBC 4.0 - 10.5 K/uL 9.6  5.3  6.3   Hemoglobin 13.0 - 17.0 g/dL 06.2  37.6  28.3   Hematocrit 39.0 - 52.0 % 31.3  50.7  51.5   Platelets 150 - 400 K/uL 367  232  273         Latest Ref Rng & Units 06/09/2023    1:03 PM 01/07/2019    8:24 AM 05/07/2018    8:53 AM  CMP  Glucose 70 - 99 mg/dL 151  761  607   BUN 8 - 23 mg/dL 15  9  7    Creatinine 0.61 - 1.24 mg/dL 3.71  0.62  6.94   Sodium 135 - 145 mmol/L 139  134  138   Potassium 3.5 - 5.1 mmol/L 4.2  4.4  4.7   Chloride 98 - 111 mmol/L 105  101  101   CO2 22 - 32 mmol/L 29  23  26    Calcium 8.9 - 10.3 mg/dL 9.0  9.4  9.4   Total Protein 6.5 - 8.1 g/dL 6.7  7.4  7.7   Total Bilirubin 0.0 - 1.2 mg/dL 0.8  0.5  1.2   Alkaline Phos 38 - 126 U/L 75  57  59   AST 15 - 41 U/L 24  19  23    ALT 0 - 44 U/L 13  12  13        RADIOGRAPHIC STUDIES: I have personally reviewed the radiological images as listed and agreed with the findings in the report. No results found.    No orders of the defined types were placed in this encounter.  All questions were answered. The patient knows to call the clinic with any problems, questions or concerns. No barriers to learning was detected. The total time spent in the appointment was 40 minutes.     Malachy Mood, MD 06/15/2023

## 2023-06-15 NOTE — Progress Notes (Unsigned)
 PATIENT NAVIGATOR PROGRESS NOTE  Name: Isaiah York Date: 06/15/2023 MRN: [id number]  DOB: 1945/12/18   Reason for visit:  New patient visit with Dr. Mosetta Putt  Comments:    Patient in office for his initial visit with Dr. Mosetta Putt.  Patient in office with his daughters. My direct contact information was given to patient and his daughters.  Patient also given Journey's binder with information specific diagnosis.    Request made for social work to reach out to patient.  Will place referral to nutrition once plan of care is determined.    Will follow-up with patient at his next visit.    Time spent counseling/coordinating care: > 60 minutes

## 2023-06-19 ENCOUNTER — Inpatient Hospital Stay: Admitting: Licensed Clinical Social Worker

## 2023-06-19 DIAGNOSIS — C220 Liver cell carcinoma: Secondary | ICD-10-CM

## 2023-06-19 NOTE — Progress Notes (Signed)
 [facility] Clinical Social Work  Initial Assessment   Isaiah York is a 78 y.o. year old male . Clinical Social Work was referred by medical provider for assessment of psychosocial needs.  CSW contacted pt's daughter, Loetta Ringer, to obtain details for assessment.  SDOH (Social Determinants of Health) assessments performed: Yes   SDOH Screenings   Tobacco Use: High Risk (06/09/2023)     Distress Screen completed: No     No data to display            Family/Social Information:  Housing Arrangement: patient lives with his wife and their 2 adult daughters.   Family members/support persons in your life? Pt's daughters and wife are his primary support and are available to assist as needed. Transportation concerns: no  Employment: Retired .  Income source: Actor concerns: No Type of concern: None Food access concerns: no Religious or spiritual practice: Not known Advanced directives: Not known Services Currently in place:  none  Coping/ Adjustment to diagnosis: Patient understands treatment plan and what happens next? Pt is still undergoing diagnostics, treatment plan not yet determined. Concerns about diagnosis and/or treatment: Overwhelmed by information, Afraid of cancer, and Quality of life Patient reported stressors: Adjusting to my illness Hopes and/or priorities: pt's priority is to start treatment w/ the hope of positive results Patient enjoys time with family/ friends Current coping skills/ strengths: Capable of independent living , Motivation for treatment/growth , and Physical Health     SUMMARY: Current SDOH Barriers:  No barriers identified at this time.  Clinical Social Work Clinical Goal(s):  No clinical social work goals at this time  Interventions: Discussed common feeling and emotions when being diagnosed with cancer, and the importance of support during treatment Informed patient of the support team roles and support services at  [facility] Provided CSW contact information and encouraged patient to call with any questions or concerns CSW spoke w/ pt's daughter regarding substance abuse history.  Per daughter pt has stopped drinking when medically necessary in the past and has not had a drink since his appt w/ Dr. Maryalice Smaller.  Pt is reportedly very nervous about treatment, but is motivated and will do what is necessary to be able to engage in treatment.  Pt's daughter informed of financial resources should needs arise while pt undergoes treatment.     Follow Up Plan: Patient will contact CSW with any support or resource needs Patient verbalizes understanding of plan: Yes    Quenton Bruns, LCSW Clinical Social Worker [facility]

## 2023-06-21 ENCOUNTER — Other Ambulatory Visit: Payer: Self-pay

## 2023-06-21 DIAGNOSIS — R160 Hepatomegaly, not elsewhere classified: Secondary | ICD-10-CM

## 2023-06-21 DIAGNOSIS — C220 Liver cell carcinoma: Secondary | ICD-10-CM

## 2023-06-23 NOTE — Progress Notes (Signed)
 BLOOD THINNER HOLD REQUEST :   Per Dr. Maryalice Smaller ok top hold

## 2023-06-23 NOTE — Progress Notes (Signed)
 Liver Biopsy ok per Dr. Elene Griffes :

## 2023-06-26 ENCOUNTER — Telehealth: Payer: Self-pay | Admitting: Hematology

## 2023-06-26 NOTE — Telephone Encounter (Signed)
 Left a detailed message of appointment details. Informed patient to call back if appointments do not work.

## 2023-06-28 NOTE — Progress Notes (Signed)
 The proposed treatment discussed in conference is for discussion purpose only and is not a binding recommendation.  The patients have not been physically examined, or presented with their treatment options.  Therefore, final treatment plans cannot be decided.

## 2023-07-12 ENCOUNTER — Other Ambulatory Visit: Payer: Self-pay | Admitting: Radiology

## 2023-07-12 DIAGNOSIS — R160 Hepatomegaly, not elsewhere classified: Secondary | ICD-10-CM

## 2023-07-13 ENCOUNTER — Telehealth: Payer: Self-pay | Admitting: Hematology

## 2023-07-13 ENCOUNTER — Encounter (HOSPITAL_COMMUNITY): Payer: Self-pay

## 2023-07-13 ENCOUNTER — Ambulatory Visit (HOSPITAL_COMMUNITY)

## 2023-07-13 NOTE — Progress Notes (Signed)
 PATIENT NAVIGATOR PROGRESS NOTE  Name: IMOGENE KAMATH Date: 07/13/2023 MRN: [id number]  DOB: [birth year]   Reason for visit:  Phone Call Follow-up  Comments:   Received phone call from patient's daughter stating patient was scheduled for Liver Biopsy today but patient did not hold his coumadin  dose as he was instructed to.  IR made aware and agreed to have schedulers reach out to patient's daughter to rescheduled procedure.  Dr. Maryalice Smaller was made aware as well.    Liver biopsy has been rescheduled.  Scheduling message sent to reschedule patient's follow-up appt.    Time spent counseling/coordinating care: 30-45 minutes

## 2023-07-14 ENCOUNTER — Other Ambulatory Visit: Payer: Self-pay

## 2023-07-14 DIAGNOSIS — C220 Liver cell carcinoma: Secondary | ICD-10-CM

## 2023-07-14 NOTE — Progress Notes (Unsigned)
 Heller, Isiah Mare, RN  Erica Hau, MD; Sonja [place], MD; Elene Griffes, MD; [name], MelissaDerrell Flight; 3 others Thank you so much.    I just spoke to patient's daughter Minor Amble and let her know of the IR referral.  Patient's daughter Minor Amble will be out of town for the next week. Dierdre Foyer, and Vickie--if you need to reach out to patient, please call patient's daughter Loetta Ringer (phone number is listed in chart) if you need to contact patient.  Thank you, Ira Mann       Previous Messages    ----- Message ----- From: Erica Hau, MD Sent: 07/14/2023   7:46 AM EDT To: Lesli Rasmussen; Elene Griffes, MD; Lavona Pounds; * Subject: RE: Liver Biopsy Rescheduled                  Antoniette Batty and Leary Provencal,  Is there an earlier biopsy slot available? Include [facility] slots.  Vickie, patient will need OP consult for liver TACE/Y-90.  Thanks,  GY ----- Message ----- From: Sonja Campton, MD Sent: 07/13/2023   8:57 PM EDT To: Elene Griffes, MD; Erica Hau, MD; * Subject: RE: Liver Biopsy Rescheduled                    Ira Mann,  Please call his daughter and let them know my recommendation of IR referral for liver targeted therapy while he is waiting for biopsy. If they agree, please refer.    Allison Ivory and Adam, this [facility] pt was discussed in tumor board a few weeks ago and liver biopsy was recommended. He unfortunately did not stop coumadin  and today's biopsy was rescheduled to 3 weeks later (first available). Could you check if you have any earlier biopsy appointment you can offer him? Please also see him to discuss liver targeted therapy. Due to the large tumor burden, I will also recommend systemic immunotherapy after biopsy. Pt is not interested in surgery.  Thanks much  Gracie Lav ----- Message ----- From: Rhetta Cellar, RN Sent: 07/13/2023   4:04 PM EDT To: Sonja [place], MD; Vera Gip Subject: Liver Biopsy Rescheduled                      Dr. Maryalice Smaller,  This is the patient I spoke  to you about earlier.  He was scheduled for liver biopsy today but did not hold his coumadin .  Biopsy has been rescheduled to the next available appt, which is 5/29.  Vanessa--Please reschedule his follow-up appt on 5/13 to the week of 6/2.    Thank you, Ira Mann

## 2023-07-14 NOTE — Progress Notes (Unsigned)
 Erica Hau, MD  Sonja [place], MD; Rhetta Cellar, RN; Elene Griffes, MD; [name], [name]; Aileen Amore L; 3 others Antoniette Batty and Leary Provencal,  Is there an earlier biopsy slot available? Include [facility] slots.  Isaiah York, patient will need OP consult for liver TACE/Y-90.  Thanks,  GY       Previous Messages    ----- Message ----- From: Sonja [place], MD Sent: 07/13/2023   8:57 PM EDT To: Elene Griffes, MD; Erica Hau, MD; * Subject: RE: Liver Biopsy Rescheduled                    Ira Mann,  Please call his daughter and let them know my recommendation of IR referral for liver targeted therapy while he is waiting for biopsy. If they agree, please refer.    Allison Ivory and Adam, this [facility] pt was discussed in tumor board a few weeks ago and liver biopsy was recommended. He unfortunately did not stop coumadin  and today's biopsy was rescheduled to 3 weeks later (first available). Could you check if you have any earlier biopsy appointment you can offer him? Please also see him to discuss liver targeted therapy. Due to the large tumor burden, I will also recommend systemic immunotherapy after biopsy. Pt is not interested in surgery.  Thanks much  Gracie Lav ----- Message ----- From: Rhetta Cellar, RN Sent: 07/13/2023   4:04 PM EDT To: Sonja Sargeant, MD; Vera Gip Subject: Liver Biopsy Rescheduled                      Dr. Maryalice Smaller,  This is the patient I spoke to you about earlier.  He was scheduled for liver biopsy today but did not hold his coumadin .  Biopsy has been rescheduled to the next available appt, which is 5/29.  Vanessa--Please reschedule his follow-up appt on 5/13 to the week of 6/2.    Thank you, Ira Mann

## 2023-07-14 NOTE — Progress Notes (Signed)
 PATIENT NAVIGATOR PROGRESS NOTE  Name: Isaiah York Date: 07/14/2023 MRN: [id number]  DOB: 1945/04/24   Reason for visit:  Follow-up Phone Call  Comments:  Called patient's daughter Minor Amble) to let her know Dr. Maryalice Smaller has recommended for patient to be referred to IR for liver directed therapy, if he patient agreed.  Daughter stated patient would like to proceed.  IR referral placed. Minor Amble stated she will be out of town next week (5/10-5/17) and asked for our office to contact her sister Loetta Ringer while she is away.      Time spent counseling/coordinating care: 45-60 minutes

## 2023-07-17 ENCOUNTER — Other Ambulatory Visit

## 2023-07-18 ENCOUNTER — Other Ambulatory Visit

## 2023-07-18 ENCOUNTER — Ambulatory Visit: Admitting: Hematology

## 2023-08-01 ENCOUNTER — Other Ambulatory Visit: Payer: Self-pay | Admitting: Radiology

## 2023-08-03 ENCOUNTER — Ambulatory Visit (HOSPITAL_COMMUNITY)
Admission: RE | Admit: 2023-08-03 | Discharge: 2023-08-03 | Disposition: A | Discharge status: Home / Self Care | Source: Ambulatory Visit | Attending: Hematology | Admitting: Hematology

## 2023-08-03 ENCOUNTER — Encounter (HOSPITAL_COMMUNITY): Payer: Self-pay

## 2023-08-03 ENCOUNTER — Other Ambulatory Visit: Payer: Self-pay

## 2023-08-03 VITALS — BP 138/72 | HR 71 | Temp 98.4°F | Resp 16 | Ht 64.0 in | Wt 103.0 lb

## 2023-08-03 DIAGNOSIS — C220 Liver cell carcinoma: Secondary | ICD-10-CM | POA: Insufficient documentation

## 2023-08-03 DIAGNOSIS — Z01818 Encounter for other preprocedural examination: Secondary | ICD-10-CM

## 2023-08-03 DIAGNOSIS — R160 Hepatomegaly, not elsewhere classified: Secondary | ICD-10-CM | POA: Diagnosis not present

## 2023-08-03 LAB — COMPREHENSIVE METABOLIC PANEL WITH GFR
ALT: 18 U/L (ref 0–44)
AST: 44 U/L — ABNORMAL HIGH (ref 15–41)
Albumin: 3.5 g/dL (ref 3.5–5.0)
Alkaline Phosphatase: 169 U/L — ABNORMAL HIGH (ref 38–126)
Anion gap: 7 (ref 5–15)
BUN: 14 mg/dL (ref 8–23)
CO2: 28 mmol/L (ref 22–32)
Calcium: 9 mg/dL (ref 8.9–10.3)
Chloride: 102 mmol/L (ref 98–111)
Creatinine, Ser: 1.02 mg/dL (ref 0.61–1.24)
GFR, Estimated: >60 mL/min (ref >60)
Glucose, Bld: 104 mg/dL — ABNORMAL HIGH (ref 70–99)
Potassium: 4 mmol/L (ref 3.5–5.1)
Sodium: 137 mmol/L (ref 135–145)
Total Bilirubin: 1 mg/dL (ref 0.0–1.2)
Total Protein: 6.9 g/dL (ref 6.5–8.1)

## 2023-08-03 LAB — CBC WITH DIFFERENTIAL/PLATELET
Abs Immature Granulocytes: 0.03 10*3/uL (ref 0.00–0.07)
Basophils Absolute: 0.1 10*3/uL (ref 0.0–0.1)
Basophils Relative: 1 %
Eosinophils Absolute: 0.3 10*3/uL (ref 0.0–0.5)
Eosinophils Relative: 3 %
HCT: 34.5 % — ABNORMAL LOW (ref 39.0–52.0)
Hemoglobin: 10.8 g/dL — ABNORMAL LOW (ref 13.0–17.0)
Immature Granulocytes: 0 %
Lymphocytes Relative: 26 %
Lymphs Abs: 2.2 10*3/uL (ref 0.7–4.0)
MCH: 31.1 pg (ref 26.0–34.0)
MCHC: 31.3 g/dL (ref 30.0–36.0)
MCV: 99.4 fL (ref 80.0–100.0)
Monocytes Absolute: 0.7 10*3/uL (ref 0.1–1.0)
Monocytes Relative: 9 %
Neutro Abs: 5.3 10*3/uL (ref 1.7–7.7)
Neutrophils Relative %: 61 %
Platelets: 386 10*3/uL (ref 150–400)
RBC: 3.47 MIL/uL — ABNORMAL LOW (ref 4.22–5.81)
RDW: 15.8 % — ABNORMAL HIGH (ref 11.5–15.5)
WBC: 8.6 10*3/uL (ref 4.0–10.5)
nRBC: 0 % (ref 0.0–0.2)

## 2023-08-03 LAB — PROTIME-INR
INR: 1.1 (ref 0.8–1.2)
Prothrombin Time: 14.8 s (ref 11.4–15.2)

## 2023-08-03 MED ORDER — MIDAZOLAM HCL 2 MG/2ML IJ SOLN
INTRAMUSCULAR | Status: AC
  Filled 2023-08-03: qty 2

## 2023-08-03 MED ORDER — SODIUM CHLORIDE 0.9 % IV SOLN: INTRAVENOUS | Status: DC

## 2023-08-03 MED ORDER — MIDAZOLAM HCL 2 MG/2ML IJ SOLN
INTRAMUSCULAR | Status: AC | PRN
  Administered 2023-08-03 (×2): 1 mg via INTRAVENOUS

## 2023-08-03 MED ORDER — FENTANYL CITRATE (PF) 100 MCG/2ML IJ SOLN
INTRAMUSCULAR | Status: AC
  Filled 2023-08-03: qty 2

## 2023-08-03 MED ORDER — FENTANYL CITRATE (PF) 100 MCG/2ML IJ SOLN
INTRAMUSCULAR | Status: AC | PRN
  Administered 2023-08-03 (×2): 50 ug via INTRAVENOUS

## 2023-08-03 MED ORDER — HYDROCODONE-ACETAMINOPHEN 5-325 MG PO TABS: 1.0000 | ORAL_TABLET | ORAL | Status: DC | PRN

## 2023-08-03 MED ORDER — LIDOCAINE HCL 1 % IJ SOLN
INTRAMUSCULAR | Status: AC
  Filled 2023-08-03: qty 20

## 2023-08-03 NOTE — Discharge Instructions (Signed)
 For questions /concerns may call Interventional Radiology at [phone] or  Interventional Radiology clinic [phone]   You may remove your dressing and shower tomorrow afternoon                                                                       Liver Biopsy, Care After After a liver biopsy, it is common to have these things in the area where the biopsy was done. You may: Have pain. Feel sore. Have bruising. You may also feel tired for a few days. Follow these instructions at home: Medicines Take over-the-counter and prescription medicines only as told by your doctor. If you were prescribed an antibiotic medicine, take it as told by your doctor. Do not stop taking the antibiotic, even if you start to feel better. Do not take medicines that may thin your blood. These medicines include aspirin and ibuprofen. Take them only if your doctor tells you to. If told, take steps to prevent problems with pooping (constipation). You may need to: Drink enough fluid to keep your pee (urine) pale yellow. Take medicines. You will be told what medicines to take. Eat foods that are high in fiber. These include beans, whole grains, and fresh fruits and vegetables. Limit foods that are high in fat and sugar. These include fried or sweet foods. Ask your doctor if you should avoid driving or using machines while you are taking your medicine. Caring for your incision Follow instructions from your doctor about how to take care of your cut from surgery (incisions). Make sure you: Wash your hands with soap and water for at least 20 seconds before and after you change your bandage. If you cannot use soap and water, use hand sanitizer. Change your bandage. Leavestitches or skin glue in place for at least two weeks. Leave tape strips alone unless you are told to take them off. You may trim the edges of the tape strips if they curl up. Check your incision every day for signs of infection. Check for: Redness,  swelling, or more pain. Fluid or blood. Warmth. Pus or a bad smell. Do not take baths, swim, or use a hot tub. Ask your doctor about taking showers or sponge baths. Activity Rest at home for 1-2 days, or as told by your doctor. Get up to take short walks every 1 to 2 hours. Ask for help if you feel weak or unsteady. Do not lift anything that is heavier than 10 lb (4.5 kg), or the limit that you are told. Do not play contact sports for 2 weeks after the procedure. Return to your normal activities as told by your doctor. Ask what activities are safe for you. General instructions  Do not drink alcohol in the first week after the procedure. Plan to have a responsible adult care for you for the time you are told after you leave the hospital or clinic. This is important. It is up to you to get the results of your procedure. Ask how to get your results when they are ready. Keep all follow-up visits. Contact a doctor if: You have more bleeding in your incision. Your incision swells, or is red and more painful. You have fluid that comes from your incision. You develop a rash.  You have fever or chills. Get help right away if: You have swelling, bloating, or pain in your belly (abdomen). You get dizzy or faint. You vomit or you feel like vomiting. You have trouble breathing or feel short of breath. You have chest pain. You have problems talking or seeing. You have trouble with your balance or moving your arms or legs. These symptoms may be an emergency. Get help right away. Call your local emergency services (911 in the [place] Do not wait to see if the symptoms will go away. Do not drive yourself to the hospital. Summary After the procedure, it is common to have pain, soreness, bruising, and tiredness. Your doctor will tell you how to take care of yourself at home. Change your bandage, take your medicines, and limit your activities as told by your doctor. Call your doctor if you have  symptoms of infection. Get help right away if your belly swells, your cut bleeds a lot, or you have trouble talking or breathing. This information is not intended to replace advice given to you by your health care provider. Make sure you discuss any questions you have with your health care provider. Document Revised: 01/06/2020 Document Reviewed: 01/06/2020 Elsevier Patient Education  2024 [employer] Inc.                                                               Moderate Conscious Sedation, Adult, Care After After the procedure, it is common to have: Sleepiness for a few hours. Impaired judgment for a few hours. Trouble with balance. Nausea or vomiting if you eat too soon. Follow these instructions at home: For the time period you were told by your health care provider:  Rest. Do not participate in activities where you could fall or become injured. Do not drive or use machinery. Do not drink alcohol. Do not take sleeping pills or medicines that cause drowsiness. Do not make important decisions or sign legal documents. Do not take care of children on your own. Eating and drinking Follow instructions from your health care provider about what you may eat and drink. Drink enough fluid to keep your urine pale yellow. If you vomit: Drink clear fluids slowly and in small amounts as you are able. Clear fluids include water, ice chips, low-calorie sports drinks, and fruit juice that has water added to it (diluted fruit juice). Eat light and bland foods in small amounts as you are able. These foods include bananas, applesauce, rice, lean meats, toast, and crackers. General instructions Take over-the-counter and prescription medicines only as told by your health care provider. Have a responsible adult stay with you for the time you are told. Do not use any products that contain nicotine or tobacco. These products include cigarettes, chewing tobacco, and vaping devices, such as e-cigarettes. If  you need help quitting, ask your health care provider. Return to your normal activities as told by your health care provider. Ask your health care provider what activities are safe for you. Your health care provider may give you more instructions. Make sure you know what you can and cannot do. Contact a health care provider if: You are still sleepy or having trouble with balance after 24 hours. You feel light-headed. You vomit every time you eat or drink. You get  a rash. You have a fever. You have redness or swelling around the IV site. Get help right away if: You have trouble breathing. You start to feel confused at home. These symptoms may be an emergency. Get help right away. Call 911. Do not wait to see if the symptoms will go away. Do not drive yourself to the hospital. This information is not intended to replace advice given to you by your health care provider. Make sure you discuss any questions you have with your health care provider. Document Revised: 09/06/2021 Document Reviewed: 09/06/2021 Elsevier Patient Education  2024 [employer].

## 2023-08-03 NOTE — Consult Note (Signed)
 Chief Complaint: Patient was seen in consultation today for liver mass  Referring Physician(s): Feng,Yan  Supervising Physician: Marland Silvas  Patient Status: [facility] - Out-pt  History of Present Illness: Isaiah York is a 78 y.o. 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  After discussion of case amongst tumor board, it was recommended that patient proceed with biopsy.  He presented for biopsy 5/8, however, unfortunately had not held his coumadin . He has since be rescheduled and presents today for procedure in his usual state of health after 5 day coumadin  hold.   Mr.  York presents today accompanied by his daughter.  His family is available for post-procedure care and transportation. He did hold his [facility] as requested. He does bring a MOST form with him today which designates him as a limited code.  No chest compressions, no intubation, but medications ok during the procedure.   Past Medical History:  Diagnosis Date   DVT (deep venous thrombosis) (HCC)    HTN (hypertension)    Hyperlipidemia     History reviewed. No pertinent surgical history.  Allergies: Tape  Medications: Prior to Admission medications   Medication Sig Start Date End Date Taking? Authorizing Provider  amLODipine (NORVASC) 5 MG tablet Take 5 mg by mouth daily.    [provider]  folic acid (FOLVITE) 1 MG tablet Take 1 mg by mouth daily.    [provider]  losartan (COZAAR) 50 MG tablet Take 50 mg by mouth daily.    [provider]  pravastatin (PRAVACHOL) 10 MG tablet Take 10 mg by mouth daily. 07/06/14   [provider]  warfarin (COUMADIN ) 2 MG tablet Take 1 tablet (2 mg total) by mouth daily at 6 PM. Patient taking differently: Take 2 mg by mouth daily at 6 PM. Taking 2 mg on Monday and Thursday and  1.5 mg all other days of week. 04/29/14   Sonja Everman, MD     Family History  Problem Relation Age of Onset   Lung cancer Mother        smoker   Stroke Father    Stomach cancer Maternal Grandmother     Social History   Socioeconomic History   Marital status: Married    Spouse name: Not on file   Number of children: Not on file   Years of education: Not on file   Highest education level: Not on file  Occupational History   Not on file  Tobacco Use   Smoking status: Every Day    Current packs/day: 0.50    Average packs/day: 0.5 packs/day for 54.0 years (27.0 ttl pk-yrs)    Types: Cigarettes   Smokeless tobacco: Never  Substance and Sexual Activity   Alcohol use: Yes    Comment: 3 beers per day now, prior to 6 pack per day   Drug use: No   Sexual activity: Not on file  Other Topics Concern   Not on file  Social History Narrative   Not on file   Social Drivers of Health   Financial Resource Strain: Not on file  Food Insecurity: Not on file  Transportation Needs: Not on file  Physical Activity: Not on file  Stress: Not on file  Social Connections: Not on file     Review of Systems: A 12 point ROS discussed and pertinent positives are indicated in the  HPI above.  All other systems are negative.  Review of Systems  Constitutional:  Negative for fatigue and fever.  Respiratory:  Negative for cough and shortness of breath.   Cardiovascular:  Negative for chest pain.  Gastrointestinal:  Negative for abdominal pain, nausea and vomiting.  Musculoskeletal:  Negative for back pain.  Psychiatric/Behavioral:  Negative for behavioral problems and confusion.     Vital Signs: BP (!) 172/91 (BP Location: Right Arm)   Pulse 96   Temp 97.8 F (36.6 C) (Oral)   Resp 16   Ht 5\' 4"  (1.626 m)   Wt 103 lb (46.7 kg)   SpO2 98%   BMI 17.68 kg/m   Physical Exam Vitals and nursing note reviewed.  Constitutional:      General: He is not in acute distress.    Appearance: Normal  appearance. He is not ill-appearing.  HENT:     Mouth/Throat:     Mouth: Mucous membranes are moist.     Pharynx: Oropharynx is clear.  Cardiovascular:     Rate and Rhythm: Normal rate and regular rhythm.  Pulmonary:     Effort: Pulmonary effort is normal.     Breath sounds: Normal breath sounds.  Abdominal:     General: Abdomen is flat. There is no distension.     Palpations: Abdomen is soft.  Skin:    General: Skin is warm and dry.  Neurological:     General: No focal deficit present.     Mental Status: He is alert and oriented to person, place, and time. Mental status is at baseline.  Psychiatric:        Mood and Affect: Mood normal.        Behavior: Behavior normal.        Thought Content: Thought content normal.        Judgment: Judgment normal.      MD Evaluation Airway: WNL Heart: WNL Abdomen: WNL Chest/ Lungs: WNL ASA  Classification: 3 Mallampati/Airway Score: Two   Imaging: No results found.  Labs:  CBC: Recent Labs    06/09/23 1303 08/03/23 1135  WBC 9.6 8.6  HGB 10.3* 10.8*  HCT 31.3* 34.5*  PLT 367 386    COAGS: No results for input(s): "INR", "APTT" in the last 8760 hours.  BMP: Recent Labs    06/09/23 1303 08/03/23 1135  NA 139 137  K 4.2 4.0  CL 105 102  CO2 29 28  GLUCOSE 118* 104*  BUN 15 14  CALCIUM 9.0 9.0  CREATININE 1.03 1.02  GFRNONAA >60 >60    LIVER FUNCTION TESTS: Recent Labs    06/09/23 1303 08/03/23 1135  BILITOT 0.8 1.0  AST 24 44*  ALT 13 18  ALKPHOS 75 169*  PROT 6.7 6.9  ALBUMIN 3.9 3.5    TUMOR MARKERS: Recent Labs    06/09/23 1303  CEA 2.17    Assessment and Plan: Patient with past medical history of polycythemia vera presents with complaint of large liver lesion.  IR consulted for liver lesion biopsy at the request of Dr. Maryalice Smaller. Case reviewed by Dr. Julietta Ogren who approves patient for procedure.  Patient presents today in their usual state of health.  He has been NPO and is not currently on  blood thinners.   Risks and benefits of liver lesion biopsy was discussed with the patient and/or patient's family including, but not limited to bleeding, infection, damage to adjacent structures or low yield requiring additional tests.  All of the questions were  answered and there is agreement to proceed.  Consent signed and in chart.    Thank you for this interesting consult.  I greatly enjoyed meeting MARGUERITE JARBOE and look forward to participating in their care.  A copy of this report was sent to the requesting provider on this date.  Electronically Signed: Tip Atienza Sue-Ellen Kamiryn Bezanson, PA 08/03/2023, 12:20 PM   I spent a total of 40 Minutes    in face to face in clinical consultation, greater than 50% of which was counseling/coordinating care for liver lesion.

## 2023-08-03 NOTE — Procedures (Signed)
  Procedure:  US  core biopsy R liver lesion 18g x3 Preprocedure diagnosis: The primary encounter diagnosis was Pre-op testing. Diagnoses of Hepatocellular carcinoma (HCC) and Liver mass, right lobe were also pertinent to this visit. Postprocedure diagnosis: same EBL:    minimal Complications:   none immediate  See full dictation in [employer].  Nicky Barrack MD Main # [phone] Pager  [phone] Mobile [phone]

## 2023-08-04 LAB — SURGICAL PATHOLOGY

## 2023-08-07 NOTE — Assessment & Plan Note (Addendum)
 T2N0M0, stage II - Presented with abdominal pain, nausea and weight loss. -CT and MRI showed a large 13 cm mass in the right lobe of liver, no nodal or distant metastasis. -Baseline AFP 31.8 -Liver biopsy on Aug 03, 2023 confirmed a well-differentiated hepatocellular carcinoma. - Will refer her to surgery and interventional radiology to discuss treatment options. - Given the large size of tumor, I also recommend systemic treatment with immunotherapy

## 2023-08-08 ENCOUNTER — Inpatient Hospital Stay: Attending: Physician Assistant | Admitting: Hematology

## 2023-08-08 VITALS — BP 124/62 | HR 106 | Temp 98.6°F | Resp 20 | Ht 64.0 in | Wt 100.4 lb

## 2023-08-08 DIAGNOSIS — E440 Moderate protein-calorie malnutrition: Secondary | ICD-10-CM | POA: Insufficient documentation

## 2023-08-08 DIAGNOSIS — C220 Liver cell carcinoma: Secondary | ICD-10-CM | POA: Insufficient documentation

## 2023-08-08 DIAGNOSIS — D45 Polycythemia vera: Secondary | ICD-10-CM | POA: Insufficient documentation

## 2023-08-08 NOTE — Progress Notes (Addendum)
 [facility]   [phone] [phone]   Clinic Follow up Note   Patient Care Team: Morgan Drivers, MD as PCP - General (Family Medicine) Ardis, Evalene CROME, RN as Oncology Nurse Navigator  Date of Service:  08/08/2023  CHIEF COMPLAINT: f/u of [facility]  CURRENT THERAPY:  Pending review versus radioembolization  Oncology History   Hepatocellular carcinoma (HCC) T2N0M0, stage II - Presented with abdominal pain, nausea and weight loss. -CT and MRI showed a large 13 cm mass in the right lobe of liver, no nodal or distant metastasis. -Baseline AFP 31.8 -Liver biopsy on Aug 03, 2023 confirmed a well-differentiated hepatocellular carcinoma. - Will refer her to surgery and interventional radiology to discuss treatment options. - Given the large size of tumor, I also recommend systemic treatment with immunotherapy  Assessment & Plan Liver cancer (Hepatocellular carcinoma) Newly diagnosed hepatocellular carcinoma in the right lobe, occupying almost the entire lobe. The tumor is large, approximately the size of a grapefruit, and well-differentiated, indicating slower growth. He experiences intermittent left-sided abdominal pain, likely due to gastric issues, as it responds to calcium carbonate. He has opted for surgical intervention, which offers the best chance of cure, despite the tumor's size posing challenges. If surgery is not feasible, liver-targeted therapy and immunotherapy are alternatives. Discussed potential side effects of immunotherapy, including autoimmune and vascular effects. Immunotherapy may involve bevacizumab and tocilizumab, which are not chemotherapy and do not cause nausea, vomiting, or alopecia but can cause autoimmune and vascular issues. Approximately 40% of patients experience mild to moderate side effects, with severe side effects being less common. - Urgent referral to surgeon, Dr. Cori or Dr. Dasie, in [place]. - If local surgery is not  feasible, consider referral to [facility], [place], or [facility]. - Continue calcium carbonate for gastric pain relief. - Monitor blood pressure due to potential immunotherapy side effects. - Communicate with interventional radiology regarding potential embolization. - Schedule follow-up post-surgical consultation.  Polycythemia vera Polycythemia vera with previous thrombotic episodes, managed with warfarin. He is not experiencing bleeding issues. Warfarin dosage adjusted to maintain therapeutic INR. Discussed holding warfarin pre-surgery without bridging therapy, as he is not at high thromboembolic risk. - Hold warfarin pre-surgery without bridging therapy. - Continue monitoring INR to ensure therapeutic range.   Moderate protein and calorie malnutrition -I encouraged him to have high-calorie and high-protein diet  Plan - Reviewed his liver biopsy from last week, which showed well-differentiated HCC - Patient is open to surgery, I will make urgent referral to Dr. Aron or Dr. Dasie -Given his very large size of tumor, I would consider neoadjuvant or adjuvant systemic treatment with Atezolizumab plus bevacizumab - I suggest patient to keep his consult appointment with IR on 6/20 -pt will call me after his consult with surgery and IR     SUMMARY OF ONCOLOGIC HISTORY: Oncology History  Hepatocellular carcinoma (HCC)  06/14/2023 Initial Diagnosis   Hepatocellular carcinoma (HCC)   08/03/2023 Cancer Staging   Staging form: Liver, AJCC 8th Edition - Clinical stage from 08/03/2023: Stage II (cT2, cN0, cM0) - Signed by Lanny Callander, MD on 08/07/2023      Discussed the use of AI scribe software for clinical note transcription with the patient, who gave verbal consent to proceed.  History of Present Illness Isaiah York is a 78 year old male with newly diagnosed hepatocellular carcinoma who presents for follow-up.  He was diagnosed with hepatocellular carcinoma following a biopsy  performed last Thursday. The tumor is  located in the right lobe of the liver, large, approximately the size of a grapefruit, and well-differentiated.  He experiences intermittent abdominal pain on the left side, rated as 4 out of 10, relieved by Tums, and not associated with meals or bowel movements. He avoids Tylenol  due to warfarin use.  He is on warfarin for blood clots related to polycythemia, with a dose adjustment from 3 mg to 1.5 mg to maintain a therapeutic INR range of 2 to 3. No bleeding issues are reported.     All other systems were reviewed with the patient and are negative.  MEDICAL HISTORY:  Past Medical History:  Diagnosis Date   DVT (deep venous thrombosis) (HCC)    HTN (hypertension)    Hyperlipidemia     SURGICAL HISTORY: No past surgical history on file.  I have reviewed the social history and family history with the patient and they are unchanged from previous note.  ALLERGIES:  is allergic to tape.  MEDICATIONS:  Current Outpatient Medications  Medication Sig Dispense Refill   amLODipine (NORVASC) 5 MG tablet Take 5 mg by mouth daily.     folic acid (FOLVITE) 1 MG tablet Take 1 mg by mouth daily.     losartan (COZAAR) 50 MG tablet Take 50 mg by mouth daily.     pravastatin (PRAVACHOL) 10 MG tablet Take 10 mg by mouth daily.  3   warfarin (COUMADIN ) 2 MG tablet Take 1 tablet (2 mg total) by mouth daily at 6 PM. (Patient taking differently: Take 2 mg by mouth daily at 6 PM. Taking 2 mg on Monday and Thursday and 1.5 mg all other days of week.) 30 tablet 4   No current facility-administered medications for this visit.    PHYSICAL EXAMINATION: ECOG PERFORMANCE STATUS: 1 - Symptomatic but completely ambulatory  Vitals:   08/08/23 1154  BP: 124/62  Pulse: (!) 106  Resp: 20  Temp: 98.6 F (37 C)  SpO2: 98%   Wt Readings from Last 3 Encounters:  08/08/23 100 lb 6.4 oz (45.5 kg)  08/03/23 103 lb (46.7 kg)  06/15/23 107 lb 3.2 oz (48.6 kg)      GENERAL:alert, no distress and comfortable, chronically ill-appearing SKIN: skin color, texture, turgor are normal, no rashes or significant lesions EYES: normal, Conjunctiva are pink and non-injected, sclera clear NMusculoskeletal:no cyanosis of digits and no clubbing  NEURO: alert & oriented x 3 with fluent speech, no focal motor/sensory deficits  Physical Exam    LABORATORY DATA:  I have reviewed the data as listed    Latest Ref Rng & Units 08/03/2023   11:35 AM 06/09/2023    1:03 PM 01/07/2019    8:24 AM  CBC  WBC 4.0 - 10.5 K/uL 8.6  9.6  5.3   Hemoglobin 13.0 - 17.0 g/dL 89.1  89.6  82.8   Hematocrit 39.0 - 52.0 % 34.5  31.3  50.7   Platelets 150 - 400 K/uL 386  367  232         Latest Ref Rng & Units 08/03/2023   11:35 AM 06/09/2023    1:03 PM 01/07/2019    8:24 AM  CMP  Glucose 70 - 99 mg/dL 895  881  844   BUN 8 - 23 mg/dL 14  15  9    Creatinine 0.61 - 1.24 mg/dL 8.97  8.96  8.77   Sodium 135 - 145 mmol/L 137  139  134   Potassium 3.5 - 5.1 mmol/L 4.0  4.2  4.4   Chloride 98 - 111 mmol/L 102  105  101   CO2 22 - 32 mmol/L 28  29  23    Calcium 8.9 - 10.3 mg/dL 9.0  9.0  9.4   Total Protein 6.5 - 8.1 g/dL 6.9  6.7  7.4   Total Bilirubin 0.0 - 1.2 mg/dL 1.0  0.8  0.5   Alkaline Phos 38 - 126 U/L 169  75  57   AST 15 - 41 U/L 44  24  19   ALT 0 - 44 U/L 18  13  12        RADIOGRAPHIC STUDIES: I have personally reviewed the radiological images as listed and agreed with the findings in the report. No results found.    Orders Placed This Encounter  Procedures   Ambulatory referral to General Surgery    Referral Priority:   Routine    Referral Type:   Surgical    Referral Reason:   Specialty Services Required    Requested Specialty:   General Surgery    Number of Visits Requested:   1   All questions were answered. The patient knows to call the clinic with any problems, questions or concerns. No barriers to learning was detected. The total time spent in the  appointment was 40 minutes, including review of chart and various tests results, discussions about plan of care and coordination of care plan     Onita Mattock, MD 08/08/2023

## 2023-08-10 ENCOUNTER — Other Ambulatory Visit: Payer: Self-pay

## 2023-08-11 DIAGNOSIS — C220 Liver cell carcinoma: Secondary | ICD-10-CM | POA: Diagnosis not present

## 2023-08-16 DIAGNOSIS — Z681 Body mass index (BMI) 19 or less, adult: Secondary | ICD-10-CM | POA: Diagnosis not present

## 2023-08-16 DIAGNOSIS — E538 Deficiency of other specified B group vitamins: Secondary | ICD-10-CM | POA: Diagnosis not present

## 2023-08-16 DIAGNOSIS — E785 Hyperlipidemia, unspecified: Secondary | ICD-10-CM | POA: Diagnosis not present

## 2023-08-16 DIAGNOSIS — E46 Unspecified protein-calorie malnutrition: Secondary | ICD-10-CM | POA: Diagnosis not present

## 2023-08-16 DIAGNOSIS — I10 Essential (primary) hypertension: Secondary | ICD-10-CM | POA: Diagnosis not present

## 2023-08-16 DIAGNOSIS — D6869 Other thrombophilia: Secondary | ICD-10-CM | POA: Diagnosis not present

## 2023-08-16 DIAGNOSIS — D751 Secondary polycythemia: Secondary | ICD-10-CM | POA: Diagnosis not present

## 2023-08-16 DIAGNOSIS — M25511 Pain in right shoulder: Secondary | ICD-10-CM | POA: Diagnosis not present

## 2023-08-16 DIAGNOSIS — E1169 Type 2 diabetes mellitus with other specified complication: Secondary | ICD-10-CM | POA: Diagnosis not present

## 2023-08-16 DIAGNOSIS — D6852 Prothrombin gene mutation: Secondary | ICD-10-CM | POA: Diagnosis not present

## 2023-08-16 DIAGNOSIS — C220 Liver cell carcinoma: Secondary | ICD-10-CM | POA: Diagnosis not present

## 2023-08-18 DIAGNOSIS — C220 Liver cell carcinoma: Secondary | ICD-10-CM | POA: Diagnosis not present

## 2023-08-25 ENCOUNTER — Ambulatory Visit
Admission: RE | Admit: 2023-08-25 | Discharge: 2023-08-25 | Disposition: A | Discharge status: Home / Self Care | Source: Ambulatory Visit | Attending: Hematology | Admitting: Hematology

## 2023-08-25 DIAGNOSIS — C220 Liver cell carcinoma: Secondary | ICD-10-CM

## 2023-08-25 DIAGNOSIS — C228 Malignant neoplasm of liver, primary, unspecified as to type: Secondary | ICD-10-CM | POA: Insufficient documentation

## 2023-08-25 DIAGNOSIS — J439 Emphysema, unspecified: Secondary | ICD-10-CM | POA: Insufficient documentation

## 2023-08-25 DIAGNOSIS — E538 Deficiency of other specified B group vitamins: Secondary | ICD-10-CM | POA: Insufficient documentation

## 2023-08-25 DIAGNOSIS — I739 Peripheral vascular disease, unspecified: Secondary | ICD-10-CM | POA: Insufficient documentation

## 2023-08-25 DIAGNOSIS — R809 Proteinuria, unspecified: Secondary | ICD-10-CM | POA: Insufficient documentation

## 2023-08-25 DIAGNOSIS — Z7901 Long term (current) use of anticoagulants: Secondary | ICD-10-CM | POA: Insufficient documentation

## 2023-08-25 DIAGNOSIS — D6852 Prothrombin gene mutation: Secondary | ICD-10-CM | POA: Insufficient documentation

## 2023-08-25 DIAGNOSIS — E46 Unspecified protein-calorie malnutrition: Secondary | ICD-10-CM | POA: Insufficient documentation

## 2023-08-25 DIAGNOSIS — F1021 Alcohol dependence, in remission: Secondary | ICD-10-CM | POA: Insufficient documentation

## 2023-08-25 DIAGNOSIS — I10 Essential (primary) hypertension: Secondary | ICD-10-CM | POA: Insufficient documentation

## 2023-08-25 DIAGNOSIS — D6869 Other thrombophilia: Secondary | ICD-10-CM | POA: Insufficient documentation

## 2023-08-25 DIAGNOSIS — E785 Hyperlipidemia, unspecified: Secondary | ICD-10-CM | POA: Insufficient documentation

## 2023-08-25 DIAGNOSIS — K219 Gastro-esophageal reflux disease without esophagitis: Secondary | ICD-10-CM | POA: Insufficient documentation

## 2023-08-25 DIAGNOSIS — E119 Type 2 diabetes mellitus without complications: Secondary | ICD-10-CM | POA: Insufficient documentation

## 2023-08-25 HISTORY — PX: IR RADIOLOGIST EVAL & MGMT: IMG5224

## 2023-08-25 NOTE — Progress Notes (Signed)
 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 Pt is joined in consultation by his 2 daughters, who are reliable historians. They report that he initially presented to his PCP w abdomimal pain, N/D and unintentional wt loss. Work up, including CT AP (05/19/23) demonstrated a 13 cm R hepatic lobe mass, and follow up Isaiah Abd (06/13/23) favored HCC. Pt was not overtly cirrhotic on imaging therefore liver mass Bx (08/03/23) was performed by my colleage, Dr Johann, confirming [facility]. He was seen by medical oncology and referred for surgical evaluation.   Given comorbidities and tumor size he is contraindicated from transplant or resection. His tumor board recommendation was for, and referred to me to discuss potential, locoregional liver therapy. He reports to feel well in general and is without discomfort or complaint. He endorses a normal appetite, normal bowel and bladder function and denies fatigue.   Review of Systems: A 12-point ROS discussed, and pertinent positives are indicated in Isaiah HPI above.  All other systems are negative.   Past Medical History:  Diagnosis Date   DVT (deep venous thrombosis) (HCC)    HTN (hypertension)    Hyperlipidemia     No past surgical history on file.  Allergies: Tape  Medications: Prior to Admission medications   Medication Sig Start Date End Date Taking? Authorizing Provider  amLODipine (NORVASC) 5 MG tablet Take 5 mg by mouth daily.    [provider]  folic acid (FOLVITE) 1 MG tablet Take 1 mg by mouth daily.    [provider]  losartan (COZAAR) 50 MG tablet Take 50 mg by mouth daily.    [provider]   pravastatin (PRAVACHOL) 10 MG tablet Take 10 mg by mouth daily. 07/06/14   [provider]  warfarin (COUMADIN ) 2 MG tablet Take 1 tablet (2 mg total) by mouth daily at 6 PM. Patient taking differently: Take 2 mg by mouth daily at 6 PM. Taking 2 mg on Monday and Thursday and 1.5 mg all other days of week. 04/29/14   Lanny Callander, MD     Family History  Problem Relation Age of Onset   Lung cancer Mother        smoker   Stroke Father    Stomach cancer Maternal Grandmother     Social History   Socioeconomic History   Marital status: Married    Spouse name: Not on file   Number of children: Not on file   Years of education: Not on file   Highest education level: Not on file  Occupational History   Not on file  Tobacco Use   Smoking status: Every Day    Current packs/day: 0.50    Average packs/day: 0.5 packs/day for 54.0 years (27.0 ttl pk-yrs)    Types: Cigarettes   Smokeless tobacco: Never  Substance and Sexual Activity   Alcohol use: Yes    Comment: 3 beers per day now, prior to 6 pack per day   Drug use: No   Sexual activity: Not on file  Other Topics Concern   Not on file  Social History Narrative   Not on file   Social Drivers of Health  Financial Resource Strain: Not on file  Food Insecurity: Not on file  Transportation Needs: Not on file  Physical Activity: Not on file  Stress: Not on file  Social Connections: Not on file    ECOG Status: 1 - Symptomatic but completely ambulatory  Review of Systems As above  Vital Signs: BP 136/69 (BP Location: Left Arm, Patient Position: Sitting, Cuff Size: Small)   Pulse 93   Resp 16   SpO2 92%   Physical Exam  General: Elderly male, thin, NAD  CV: RRR on monitor Pulm: normal work of breathing on RA Abd: S, ND, NT MSK: Grossly normal Psych: Appropriate affect.    Imaging:      Isaiah Abd W/WO, 06/13/23 IMPRESSION:  1. Large, heterogeneously hyperenhancing mass occupying essentially Isaiah entirety of Isaiah  right lobe of Isaiah liver, measuring 13.1 x 11.8 cm, with clear evidence of washout, capsular enhancement, and areas of intrinsically T1 hyperintense internal necrosis and hemorrhage. Findings strongly favor a large hepatocellular carcinoma.  2. No evidence of lymphadenopathy or metastatic disease in Isaiah abdomen.  3. No morphologic evidence of cirrhosis.   US  BIOPSY (LIVER) Result Date: 08/03/2023 CLINICAL DATA:  Right hepatic mass EXAM: ULTRASOUND-GUIDED CORE LIVER BIOPSY TECHNIQUE: An ultrasound guided liver biopsy was thoroughly discussed with Isaiah patient and questions were answered. Isaiah benefits, risks, alternatives, and complications were also discussed. Isaiah patient understands and wishes to proceed with Isaiah procedure. A verbal as well as written consent was obtained. Survey ultrasound of Isaiah liver was performed, lesion localized, and an appropriate skin entry site was determined. Skin site was marked, prepped with chlorhexidine, and draped in usual sterile fashion, and infiltrated locally with 1% lidocaine . Intravenous Fentanyl  50mcg and Versed  1mg  were administered by RN during a total moderate (conscious) sedation time of 20 minutes; Isaiah patient's level of consciousness and physiological / cardiorespiratory status were monitored continuously by radiology RN under my direct supervision. A 17 gauge trocar needle was advanced under ultrasound guidance into Isaiah liver to Isaiah margin of Isaiah lesion. 3 solid-appearing coaxial 18gauge core samples were then obtained through Isaiah guide needle. Isaiah guide needle was removed. Post procedure scans demonstrate no apparent complication. COMPLICATIONS: COMPLICATIONS None immediate FINDINGS: Heterogenous large right hepatic lobe mass was localized. Representative core biopsy samples were obtained. IMPRESSION: 1. Technically successful ultrasound guided core liver lesion biopsy. Electronically Signed   By: JONETTA Faes M.D.   On: 08/03/2023 17:01    Labs:  CBC: Recent  Labs    06/09/23 1303 08/03/23 1135  WBC 9.6 8.6  HGB 10.3* 10.8*  HCT 31.3* 34.5*  PLT 367 386    COAGS: Recent Labs    08/03/23 1250  INR 1.1    BMP: Recent Labs    06/09/23 1303 08/03/23 1135  NA 139 137  K 4.2 4.0  CL 105 102  CO2 29 28  GLUCOSE 118* 104*  BUN 15 14  CALCIUM 9.0 9.0  CREATININE 1.03 1.02  GFRNONAA >60 >60    LIVER FUNCTION TESTS: Recent Labs    06/09/23 1303 08/03/23 1135  BILITOT 0.8 1.0  AST 24 44*  ALT 13 18  ALKPHOS 75 169*  PROT 6.7 6.9  ALBUMIN 3.9 3.5   MELD 3.0: 7 at 08/03/2023 12:50 PM MELD-Na: 7 at 08/03/2023 12:50 PM Calculated from: Serum Creatinine: 1.02 mg/dL at 4/70/7974 88:64 AM Serum Sodium: 137 mmol/L at 08/03/2023 11:35 AM Total Bilirubin: 1 mg/dL at 4/70/7974 88:64 AM Serum Albumin: 3.5 g/dL at 4/70/7974 88:64 AM  INR(ratio): 1.1 at 08/03/2023 12:50 PM Age at listing (hypothetical): 2 years Sex: Male at 08/03/2023 12:50 PM  TUMOR MARKERS: Recent Labs    06/09/23 1303  CEA 2.17    Latest Reference Range & Units 06/09/23 13:03  AFP, Serum, Tumor Marker 0.0 - 8.4 ng/mL 31.8 (H)  (H): Data is abnormally high   Cancer Staging  Hepatocellular carcinoma (HCC) Staging form: Liver, AJCC 8th Edition - Clinical stage from 08/03/2023: Stage II (cT2, cN0, cM0) - Signed by Lanny Callander, MD on 08/07/2023   PATHOLOGY;  SURGICAL PATHOLOGY CASE: [id number] PATIENT: Isaiah York Surgical Pathology Report  Clinical History: large liver mass, no primary (tb)  FINAL MICROSCOPIC DIAGNOSIS:  A. 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  Bx proven HCC. 13 cm, dominant within R hepatic lobe   ECOG 1 Compensated liver function. Child-Pugh score, 7 pts / Class A. MELD 7 (08/03/23) AFP 31.8 (06/09/23)   Pt is currently contraindicated against transplant or surgical  resection given mass size and comorbidity. They are interested in pursuing a minimally-invasive option for locoregional HCC control and we discussed ablative vs transarterial therapies, with radioembolization (TARE) and chemoembolization (TACE). Isaiah patient understood that this is not curative and may help control their disease burden.     Given their disease burden and current health state, I anticipate radioembolization with Y-90 as Isaiah best option at this time. Isaiah procedure has been fully reviewed with Isaiah patient/patient's authorized representative. Isaiah risks, benefits and alternatives have been explained, and Isaiah patient/patient's authorized representative has consented to Isaiah procedure.  *Isaiah Abdomen W/WO reviewed. Request imaging update with multiphase CTA Abdomen prior to treatment *OK to begin authorization process for Y-90 mapping and treatment *Will plan for trans-radial access, for both mapping and Rx *No need to hold warfarin *procedure to be performed at [facility] Proceed to schedule based on mutual availability. *Same day procedures, no overnight admission. *Will obtain MELD labs (CBC, CMP, INR) and AFP on procedure day arrival   Thank you for this interesting consult.  I greatly enjoyed meeting Isaiah York and look forward to participating in their care.  A copy of this report was sent to Isaiah requesting provider on this date.  Electronically Signed:  Thom Hall, MD Vascular and Interventional Radiology Specialists [facility] Radiology   Pager. [phone] Clinic. [phone]  I spent a total of 60 Minutes  in face to face in clinical consultation, greater than 50% of which was counseling/coordinating care for Isaiah York Isaiah York evaluation for treatment.

## 2023-09-18 ENCOUNTER — Other Ambulatory Visit: Payer: Self-pay | Admitting: Interventional Radiology

## 2023-09-18 DIAGNOSIS — C220 Liver cell carcinoma: Secondary | ICD-10-CM

## 2023-09-18 NOTE — Addendum Note (Signed)
 Encounter addended by: Hughes Simmonds, MD on: 09/18/2023 5:21 AM  Actions taken: Clinical Note Signed

## 2023-09-22 NOTE — Addendum Note (Signed)
 Encounter addended by: Jarold Nest on: 09/22/2023 11:03 AM  Actions taken: Imaging Exam ended

## 2023-09-25 ENCOUNTER — Other Ambulatory Visit: Payer: Self-pay | Admitting: Interventional Radiology

## 2023-09-25 DIAGNOSIS — C220 Liver cell carcinoma: Secondary | ICD-10-CM

## 2023-10-04 ENCOUNTER — Telehealth: Payer: Self-pay | Admitting: Hematology

## 2023-10-04 NOTE — Telephone Encounter (Signed)
Scheduled appointments per staff message. Talked with the patients daughter and she is aware of the made appointments for the patient.

## 2023-10-05 ENCOUNTER — Ambulatory Visit
Admission: RE | Admit: 2023-10-05 | Discharge: 2023-10-05 | Disposition: A | Discharge status: Home / Self Care | Source: Ambulatory Visit | Attending: Interventional Radiology

## 2023-10-05 DIAGNOSIS — C220 Liver cell carcinoma: Secondary | ICD-10-CM

## 2023-10-05 DIAGNOSIS — I10 Essential (primary) hypertension: Secondary | ICD-10-CM | POA: Diagnosis not present

## 2023-10-05 MED ORDER — IOPAMIDOL (ISOVUE-370) INJECTION 76%
100.0000 mL | Freq: Once | INTRAVENOUS | Status: AC | PRN
  Administered 2023-10-05: 100 mL via INTRAVENOUS

## 2023-10-07 ENCOUNTER — Other Ambulatory Visit: Payer: Self-pay | Admitting: Radiology

## 2023-10-07 DIAGNOSIS — C220 Liver cell carcinoma: Secondary | ICD-10-CM

## 2023-10-08 ENCOUNTER — Other Ambulatory Visit: Payer: Self-pay | Admitting: Radiology

## 2023-10-08 DIAGNOSIS — C220 Liver cell carcinoma: Secondary | ICD-10-CM

## 2023-10-08 NOTE — H&P (Signed)
 Chief Complaint: Hepatocellular carcinoma; referred for visceral/hepatic arteriogram with possible embolization/test Y-90 dosing (arterial roadmapping study)  Referring Provider(s): Feng,Y  Supervising Physician: Hughes Simmonds  Patient Status: [facility] - Out-pt  History of Present Illness: Isaiah York is a 78 y.o. 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 He underwent consultation with Dr. Hughes on 08/25/23 to discuss treatment options for his [facility] and was deemed an appropriate candidate for hepatic Y-90 radioembolization. He presents today for hepatic/visceral arteriogram with possible embolization/test Y-90 dosing (preliminary arterial roadmapping study).  *** Patient is Full Code  Past Medical History:  Diagnosis Date   DVT (deep venous thrombosis) (HCC)    HTN (hypertension)    Hyperlipidemia     Past Surgical History:  Procedure Laterality Date   IR RADIOLOGIST EVAL & MGMT  08/25/2023    Allergies: Tape  Medications: Prior to Admission medications   Medication Sig Start Date End Date Taking? Authorizing Provider  amLODipine (NORVASC) 5 MG tablet Take 5 mg by mouth daily.    [provider]  folic acid (FOLVITE) 1 MG tablet Take 1 mg by mouth daily.    [provider]  losartan (COZAAR) 50 MG tablet Take 50 mg by mouth daily.    [provider]  pravastatin (PRAVACHOL) 10 MG tablet Take 10 mg by mouth daily. 07/06/14   [provider]  warfarin (COUMADIN ) 2 MG tablet Take 1 tablet (2 mg total) by mouth daily at 6 PM. Patient taking differently: Take 2 mg by mouth daily at 6 PM. Taking 2 mg on Monday and Thursday and 1.5 mg all other days of week. 04/29/14   Lanny Callander, MD     Family History  Problem Relation Age of Onset   Lung cancer Mother        smoker   Stroke  Father    Stomach cancer Maternal Grandmother     Social History   Socioeconomic History   Marital status: Married    Spouse name: Not on file   Number of children: Not on file   Years of education: Not on file   Highest education level: Not on file  Occupational History   Not on file  Tobacco Use   Smoking status: Every Day    Current packs/day: 0.50    Average packs/day: 0.5 packs/day for 54.0 years (27.0 ttl pk-yrs)    Types: Cigarettes   Smokeless tobacco: Never  Substance and Sexual Activity   Alcohol use: Yes    Comment: 3 beers per day now, prior to 6 pack per day   Drug use: No   Sexual activity: Not on file  Other Topics Concern   Not on file  Social History Narrative   Not on file   Social Drivers of Health   Financial Resource Strain: Not on file  Food Insecurity: Not on file  Transportation Needs: Not on file  Physical Activity: Not on file  Stress: Not on file  Social Connections: Not on file       Review of Systems  Vital Signs:   Advance Care Plan: no documents on file   Physical Exam  Imaging: No results found.  Labs:  CBC: Recent Labs    06/09/23 1303 08/03/23 1135  WBC 9.6 8.6  HGB 10.3* 10.8*  HCT 31.3* 34.5*  PLT 367  386    COAGS: Recent Labs    08/03/23 1250  INR 1.1    BMP: Recent Labs    06/09/23 1303 08/03/23 1135  NA 139 137  K 4.2 4.0  CL 105 102  CO2 29 28  GLUCOSE 118* 104*  BUN 15 14  CALCIUM 9.0 9.0  CREATININE 1.03 1.02  GFRNONAA >60 >60    LIVER FUNCTION TESTS: Recent Labs    06/09/23 1303 08/03/23 1135  BILITOT 0.8 1.0  AST 24 44*  ALT 13 18  ALKPHOS 75 169*  PROT 6.7 6.9  ALBUMIN  3.9 3.5    TUMOR MARKERS: Recent Labs    06/09/23 1303  CEA 2.17    Assessment and Plan: 78 y.o. 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 He underwent consultation with Dr. Hughes on 08/25/23 to discuss treatment options for his [facility] and was deemed an appropriate candidate for hepatic Y-90 radioembolization. He presents today for hepatic/visceral arteriogram with possible embolization/test Y-90 dosing (preliminary arterial roadmapping study).Risks and benefits of procedure were discussed with the patient including, but not limited to bleeding, infection, vascular injury or contrast induced renal failure.  This interventional procedure involves the use of X-rays and because of the nature of the planned procedure, it is possible that we will have prolonged use of X-ray fluoroscopy.  Potential radiation risks to you include (but are not limited to) the following: - A slightly elevated risk for cancer  several years later in life. This risk is typically less than 0.5% percent. This risk is low in comparison to the normal incidence of human cancer, which is 33% for women and 50% for men according to the [employer] Cancer Society. - Radiation induced injury can include skin redness, resembling a rash, tissue breakdown / ulcers and hair loss (which can be temporary or permanent).   The likelihood of either of these occurring depends on the difficulty of the procedure and whether you are sensitive to radiation due to previous procedures, disease, or genetic conditions.   IF your procedure requires a prolonged use of radiation, you will be notified and given written instructions for further action.  It is your responsibility to monitor the irradiated area for the 2 weeks following the procedure and to notify your physician if you are concerned that you have suffered a radiation induced injury.    All of the patient's questions were answered, patient is agreeable to proceed.  Consent signed and in chart.      Thank you for allowing our service to participate in Isaiah York 's care.  Electronically Signed: D.  Franky Rakers, PA-C   10/08/2023, 5:33 PM      I spent a total of    25 Minutes in face to face in clinical consultation, greater than 50% of which was counseling/coordinating care for visceral/hepatic arteriogram with possible embolization/test Y-90 dosing

## 2023-10-10 ENCOUNTER — Other Ambulatory Visit: Payer: Self-pay | Admitting: Interventional Radiology

## 2023-10-10 ENCOUNTER — Ambulatory Visit (HOSPITAL_COMMUNITY)
Admission: RE | Admit: 2023-10-10 | Discharge: 2023-10-10 | Disposition: A | Discharge status: Home / Self Care | Source: Ambulatory Visit | Attending: Interventional Radiology | Admitting: Interventional Radiology

## 2023-10-10 ENCOUNTER — Encounter (HOSPITAL_COMMUNITY): Payer: Self-pay

## 2023-10-10 ENCOUNTER — Other Ambulatory Visit: Payer: Self-pay

## 2023-10-10 DIAGNOSIS — C220 Liver cell carcinoma: Secondary | ICD-10-CM | POA: Diagnosis not present

## 2023-10-10 DIAGNOSIS — Z86718 Personal history of other venous thrombosis and embolism: Secondary | ICD-10-CM | POA: Diagnosis not present

## 2023-10-10 DIAGNOSIS — Z7901 Long term (current) use of anticoagulants: Secondary | ICD-10-CM | POA: Insufficient documentation

## 2023-10-10 DIAGNOSIS — D751 Secondary polycythemia: Secondary | ICD-10-CM | POA: Diagnosis not present

## 2023-10-10 DIAGNOSIS — F1010 Alcohol abuse, uncomplicated: Secondary | ICD-10-CM | POA: Diagnosis not present

## 2023-10-10 DIAGNOSIS — F17210 Nicotine dependence, cigarettes, uncomplicated: Secondary | ICD-10-CM | POA: Diagnosis not present

## 2023-10-10 HISTORY — PX: IR US GUIDE VASC ACCESS RIGHT: IMG2390

## 2023-10-10 HISTORY — PX: IR ANGIOGRAM VISCERAL SELECTIVE: IMG657

## 2023-10-10 HISTORY — PX: IR ANGIOGRAM SELECTIVE EACH ADDITIONAL VESSEL: IMG667

## 2023-10-10 HISTORY — PX: IR EMBO ARTERIAL NOT HEMORR HEMANG INC GUIDE ROADMAPPING: IMG5448

## 2023-10-10 LAB — CBC WITH DIFFERENTIAL/PLATELET
Abs Immature Granulocytes: 0.03 K/uL (ref 0.00–0.07)
Basophils Absolute: 0.1 K/uL (ref 0.0–0.1)
Basophils Relative: 1 %
Eosinophils Absolute: 0.3 K/uL (ref 0.0–0.5)
Eosinophils Relative: 3 %
HCT: 30.7 % — ABNORMAL LOW (ref 39.0–52.0)
Hemoglobin: 9.4 g/dL — ABNORMAL LOW (ref 13.0–17.0)
Immature Granulocytes: 0 %
Lymphocytes Relative: 20 %
Lymphs Abs: 1.7 K/uL (ref 0.7–4.0)
MCH: 29.7 pg (ref 26.0–34.0)
MCHC: 30.6 g/dL (ref 30.0–36.0)
MCV: 96.8 fL (ref 80.0–100.0)
Monocytes Absolute: 0.7 K/uL (ref 0.1–1.0)
Monocytes Relative: 9 %
Neutro Abs: 5.6 K/uL (ref 1.7–7.7)
Neutrophils Relative %: 67 %
Platelets: 335 K/uL (ref 150–400)
RBC: 3.17 MIL/uL — ABNORMAL LOW (ref 4.22–5.81)
RDW: 20.8 % — ABNORMAL HIGH (ref 11.5–15.5)
WBC: 8.5 K/uL (ref 4.0–10.5)
nRBC: 0 % (ref 0.0–0.2)

## 2023-10-10 LAB — COMPREHENSIVE METABOLIC PANEL WITH GFR
ALT: 15 U/L (ref 0–44)
AST: 38 U/L (ref 15–41)
Albumin: 3.2 g/dL — ABNORMAL LOW (ref 3.5–5.0)
Alkaline Phosphatase: 103 U/L (ref 38–126)
Anion gap: 12 (ref 5–15)
BUN: 11 mg/dL (ref 8–23)
CO2: 23 mmol/L (ref 22–32)
Calcium: 9.2 mg/dL (ref 8.9–10.3)
Chloride: 109 mmol/L (ref 98–111)
Creatinine, Ser: 1.14 mg/dL (ref 0.61–1.24)
GFR, Estimated: >60 mL/min (ref >60)
Glucose, Bld: 113 mg/dL — ABNORMAL HIGH (ref 70–99)
Potassium: 3.9 mmol/L (ref 3.5–5.1)
Sodium: 144 mmol/L (ref 135–145)
Total Bilirubin: 1.6 mg/dL — ABNORMAL HIGH (ref 0.0–1.2)
Total Protein: 6.9 g/dL (ref 6.5–8.1)

## 2023-10-10 LAB — PROTIME-INR
INR: 1.6 — ABNORMAL HIGH (ref 0.8–1.2)
Prothrombin Time: 20.3 s — ABNORMAL HIGH (ref 11.4–15.2)

## 2023-10-10 MED ORDER — MIDAZOLAM HCL 2 MG/2ML IJ SOLN
INTRAMUSCULAR | Status: AC
  Filled 2023-10-10: qty 2

## 2023-10-10 MED ORDER — IOHEXOL 300 MG/ML  SOLN
50.0000 mL | Freq: Once | INTRAMUSCULAR | Status: AC | PRN
  Administered 2023-10-10: 40 mL via INTRA_ARTERIAL

## 2023-10-10 MED ORDER — IOHEXOL 300 MG/ML  SOLN
100.0000 mL | Freq: Once | INTRAMUSCULAR | Status: AC | PRN
  Administered 2023-10-10: 80 mL via INTRA_ARTERIAL

## 2023-10-10 MED ORDER — TECHNETIUM TO 99M ALBUMIN AGGREGATED
4.1000 | Freq: Once | INTRAVENOUS | Status: AC
  Administered 2023-10-10: 4.1 via INTRAVENOUS

## 2023-10-10 MED ORDER — MIDAZOLAM HCL 2 MG/2ML IJ SOLN
INTRAMUSCULAR | Status: AC | PRN
  Administered 2023-10-10: .5 mg via INTRAVENOUS

## 2023-10-10 MED ORDER — FENTANYL CITRATE (PF) 100 MCG/2ML IJ SOLN
INTRAMUSCULAR | Status: AC | PRN
  Administered 2023-10-10: 25 ug via INTRAVENOUS

## 2023-10-10 MED ORDER — FENTANYL CITRATE (PF) 100 MCG/2ML IJ SOLN
INTRAMUSCULAR | Status: AC | PRN
  Administered 2023-10-10: 50 ug via INTRAVENOUS

## 2023-10-10 MED ORDER — FENTANYL CITRATE (PF) 100 MCG/2ML IJ SOLN
INTRAMUSCULAR | Status: AC
  Filled 2023-10-10: qty 2

## 2023-10-10 MED ORDER — FENTANYL CITRATE (PF) 100 MCG/2ML IJ SOLN
INTRAMUSCULAR | Status: AC
Start: 2023-10-10 — End: 2023-10-10
  Filled 2023-10-10: qty 2

## 2023-10-10 MED ORDER — MIDAZOLAM HCL 2 MG/2ML IJ SOLN
INTRAMUSCULAR | Status: AC | PRN
Start: 2023-10-10 — End: 2023-10-10
  Administered 2023-10-10: 1 mg via INTRAVENOUS

## 2023-10-10 MED ORDER — MIDAZOLAM HCL 2 MG/2ML IJ SOLN
INTRAMUSCULAR | Status: AC | PRN
  Administered 2023-10-10: 1 mg via INTRAVENOUS

## 2023-10-10 MED ORDER — LIDOCAINE HCL 1 % IJ SOLN
INTRAMUSCULAR | Status: AC
  Filled 2023-10-10: qty 20

## 2023-10-10 MED ORDER — IOHEXOL 300 MG/ML  SOLN
100.0000 mL | Freq: Once | INTRAMUSCULAR | Status: AC | PRN
  Administered 2023-10-10: 15 mL via INTRA_ARTERIAL

## 2023-10-10 MED ORDER — FENTANYL CITRATE (PF) 100 MCG/2ML IJ SOLN
INTRAMUSCULAR | Status: AC | PRN
  Administered 2023-10-10 (×2): 50 ug via INTRAVENOUS

## 2023-10-10 MED ORDER — LIDOCAINE HCL 1 % IJ SOLN
20.0000 mL | Freq: Once | INTRAMUSCULAR | Status: AC
  Administered 2023-10-10: 8 mL via INTRADERMAL

## 2023-10-10 MED ORDER — SODIUM CHLORIDE 0.9 % IV SOLN: INTRAVENOUS | Status: DC

## 2023-10-10 NOTE — Sedation Documentation (Signed)
 Patient transported to MI for scan via stretcher in stable condition by this RN.

## 2023-10-10 NOTE — Discharge Instructions (Signed)
 Femoral Site Care  What can I expect after the procedure? After the procedure, it is common to have: Bruising that usually fades within 1-2 weeks. Tenderness at the site.  Follow these instructions at home: Wound care Follow instructions from your health care provider about how to take care of your insertion site. Make sure you: Wash your hands with soap and water before you change your bandage (dressing). If soap and water are not available, use hand sanitizer. Change your dressing as told by your health care provider. Leave stitches (sutures), skin glue, or adhesive strips in place. These skin closures may need to stay in place for 2 weeks or longer. If adhesive strip edges start to loosen and curl up, you may trim the loose edges. Do not remove adhesive strips completely unless your health care provider tells you to do that. Do not take baths, swim, or use a hot tub until your health care provider approves. You may shower 24-48 hours after the procedure or as told by your health care provider. Gently wash the site with plain soap and water. Pat the area dry with a clean towel. Do not rub the site. This may cause bleeding. Do not apply powder or lotion to the site. Keep the site clean and dry. Check your femoral site every day for signs of infection. Check for: Redness, swelling, or pain. Fluid or blood. Warmth. Pus or a bad smell. Activity For the first 2-3 days after your procedure, or as long as directed: Avoid climbing stairs as much as possible. Do not squat. Do not lift anything that is heavier than 10 lb (4.5 kg), or the limit that you are told, until your health care provider says that it is safe. Rest as directed. Avoid sitting for a long time without moving. Get up to take short walks every 1-2 hours. Do not drive for 24 hours if you were given a medicine to help you relax (sedative).  General instructions Take over-the-counter and prescription medicines only as told by  your health care provider. Keep all follow-up visits as told by your health care provider. This is important.  Contact a health care provider if you have: A fever or chills. You have redness, swelling, or pain around your insertion site.  Get help right away if: The catheter insertion area swells very fast. You pass out, or have the feeling that you will pass out The catheter insertion area is bleeding, and the bleeding does not stop when you hold steady pressure on the area. The area near or just beyond the catheter insertion site becomes pale, cool, tingly, or numb. (Right Leg)   Please rest for the next 24 hours, drink plenty of fluid, you can shower tomorrow.

## 2023-10-10 NOTE — Sedation Documentation (Signed)
 Angioseal vascular closure device deployed by Dr Hughes in Right femoral artery for hemostasis.

## 2023-10-10 NOTE — Procedures (Signed)
 Vascular and Interventional Radiology Procedure Note  Patient: Isaiah York DOB: [birth year] Medical Record Number: [id number] Note Date/Time: 10/10/23 2:07 PM   Performing Physician: Thom Hall, MD Assistant(s): None  Diagnosis:  HCC.   Procedure:  HEPATIC ARTERIOGRAPHY  MAA INJECTION FOR Y90 MAPPING  Anesthesia: Conscious Sedation Complications: None Estimated Blood Loss: Minimal Specimens: None  Findings:  - access via the LEFT femoral artery. - diagnostic hepatic angiography was performed.   - successful 99mTc MAA injection into the RIGHT hepatic artery. - AngioSeal closure at the R groin with distal RLE pulses at the end of the case.  Plan: - Post sheath removal precautions.  - Bedrest with RLE straight x2hrs.  Final report to follow once all images are reviewed and compared with previous studies.  See detailed dictation with images in PACS. The patient tolerated the procedure well without incident or complication and was returned to Recovery in stable condition.    Thom Hall, MD Vascular and Interventional Radiology Specialists [facility] Radiology   Pager. [phone] Clinic. [phone]

## 2023-10-10 NOTE — Sedation Documentation (Signed)
 RN Deborah Lazcano pulled 4 mg Versed  and 200 mcg Fentanyl  in Ir room. Pt. Received 3 mg Versed  and 150 mcg Fentanyl  before Sherrilee Rn handoff to [employer]. Alvon Nygaard RN handoff 1mg  Versed  and 50mcg Fentanyl .

## 2023-10-11 LAB — AFP TUMOR MARKER: AFP, Serum, Tumor Marker: 31.6 ng/mL — ABNORMAL HIGH (ref 0.0–8.4)

## 2023-10-16 ENCOUNTER — Other Ambulatory Visit: Payer: Self-pay | Admitting: Interventional Radiology

## 2023-10-16 ENCOUNTER — Encounter (HOSPITAL_COMMUNITY): Payer: Self-pay | Admitting: Radiology

## 2023-10-16 DIAGNOSIS — C220 Liver cell carcinoma: Secondary | ICD-10-CM

## 2023-10-22 ENCOUNTER — Other Ambulatory Visit: Payer: Self-pay | Admitting: Radiology

## 2023-10-22 DIAGNOSIS — C220 Liver cell carcinoma: Secondary | ICD-10-CM

## 2023-10-23 NOTE — H&P (Signed)
 Chief Complaint: Hepatocellular carcinoma; referred for hepatic arteriogram with Y90 radioembolization  Referring Provider(s): Feng,Y  Supervising Physician: Hughes Simmonds  Patient Status: [facility] - Out-pt  History of Present Illness: Isaiah York is a 78 y.o. 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 He underwent consultation with Dr. Hughes on 08/25/23 to discuss treatment options for his [facility] and was deemed an appropriate candidate for hepatic Y-90 radioembolization .  He presents today for the procedure.  He underwent hepatic/visceral arterial roadmapping study on 10/10/2023.   Patient is Full Code  Past Medical History:  Diagnosis Date   DVT (deep venous thrombosis) (HCC)    HTN (hypertension)    Hyperlipidemia     Past Surgical History:  Procedure Laterality Date   IR ANGIOGRAM SELECTIVE EACH ADDITIONAL VESSEL  10/10/2023   IR ANGIOGRAM SELECTIVE EACH ADDITIONAL VESSEL  10/10/2023   IR ANGIOGRAM VISCERAL SELECTIVE  10/10/2023   IR RADIOLOGIST EVAL & MGMT  08/25/2023   IR US  GUIDE VASC ACCESS RIGHT  10/10/2023    Allergies: Tape  Medications: Prior to Admission medications   Medication Sig Start Date End Date Taking? Authorizing Provider  amLODipine (NORVASC) 5 MG tablet Take 5 mg by mouth daily.    [provider]  folic acid (FOLVITE) 1 MG tablet Take 1 mg by mouth daily.    [provider]  losartan (COZAAR) 50 MG tablet Take 50 mg by mouth daily.    [provider]  pravastatin (PRAVACHOL) 10 MG tablet Take 10 mg by mouth daily. 07/06/14   [provider]  warfarin (COUMADIN ) 2 MG tablet Take 1 tablet (2 mg total) by mouth daily at 6 PM. Patient taking differently: Take 2 mg by mouth daily at 6 PM. Taking 2 mg on Monday and Thursday and 1.5 mg all other days of week.  04/29/14   Lanny Callander, MD     Family History  Problem Relation Age of Onset   Lung cancer Mother        smoker   Stroke Father    Stomach cancer Maternal Grandmother     Social History   Socioeconomic History   Marital status: Married    Spouse name: Not on file   Number of children: Not on file   Years of education: Not on file   Highest education level: Not on file  Occupational History   Not on file  Tobacco Use   Smoking status: Every Day    Current packs/day: 0.50    Average packs/day: 0.5 packs/day for 54.0 years (27.0 ttl pk-yrs)    Types: Cigarettes   Smokeless tobacco: Never  Substance and Sexual Activity   Alcohol use: Yes    Comment: 3 beers per day now, prior to 6 pack per day   Drug use: No   Sexual activity: Not on file  Other Topics Concern   Not on file  Social History Narrative   Not on file   Social Drivers of Health   Financial Resource Strain: Not on file  Food Insecurity: Not on file  Transportation Needs: Not on file  Physical Activity: Not on file  Stress: Not on file  Social Connections: Not on file       Review of Systems  Vital Signs:   Advance Care Plan: No documents on file  Physical Exam  Imaging: NM PRE Y90 LIVER SPECT LUNG SHUNT ASSESSMENT Result Date: 10/16/2023 CLINICAL DATA:  Unresectable hepatocellular carcinoma. Pre yttrium 90 radio embolization arterial mapping. EXAM: NUCLEAR MEDICINE LIVER SCAN TECHNIQUE: Abdominal images were obtained in multiple projections after intrahepatic arterial injection of radiopharmaceutical. SPECT imaging was performed. Lung shunt calculation was performed. RADIOPHARMACEUTICALS:  4.78millicurie MAA TECHNETIUM TO 61M ALBUMIN  AGGREGATED COMPARISON:  CT 10/05/2023 FINDINGS: The injected microaggregated albumin  localizes within RIGHT hepatic lobe. Large tumor outlined with central photopenia. No evidence of activity within the stomach, duodenum, or bowel. Calculated shunt fraction to the lungs  equals 10.7%. IMPRESSION: 1. No significant extrahepatic radiotracer activity following intrahepatic arterial injection of MAA. 2. Lung shunt fraction equals 10.7% Electronically Signed   By: Jackquline Boxer M.D.   On: 10/16/2023 13:45   IR US  Guide Vasc Access Right Result Date: 10/12/2023 INDICATION: PRE Y90 Briefly, 78 year old male with massive RIGHT hepatocellular carcinoma. Patient presents today for pre Y-90/mapping. EXAM: Title; TRANSARTERIAL MAA INJECTION FOR Y90 MAPPING Listed procedures; 1. ULTRASOUND-GUIDED ACCESS OF THE RIGHT FEMORAL ARTERY 2. CELIAC, COMMON HEPATIC and RIGHT HEPATIC ARTERIOGRAMS 3. CONE BEAM CTA OF THE LIVER 4. TRANSARTERIAL ADMINISTRATION OF Tc MAA COMPARISON:  CTA abdomen, 10/05/2023. MR abdomen, 06/13/2023. CT AP, 05/19/2023. MEDICATIONS: None RADIOPHARMACEUTICALS:  4.1 mCi Tc57m MAA administered via the RIGHT hepatic artery CONTRAST:  15mL OMNIPAQUE  IOHEXOL  300 MG/ML SOLN, 80mL OMNIPAQUE  IOHEXOL  300 MG/ML SOLN, 40mL OMNIPAQUE  IOHEXOL  300 MG/ML SOLN, 40mL OMNIPAQUE  IOHEXOL  300 MG/ML SOLN ANESTHESIA/SEDATION: Moderate (conscious) sedation was employed during this procedure. A total of Versed  3.5 mg and Fentanyl  175 mcg was administered intravenously. Moderate Sedation Time: 117 minutes. The patient's level of consciousness and vital signs were monitored continuously by radiology nursing throughout the procedure under my direct supervision. FLUOROSCOPY: Radiation Exposure Index and estimated peak skin dose (PSD); Reference air kerma (RAK), 114 mGy. ACCESS: RIGHT, transfemoral. Hemostasis achieved with Angio-Seal closure. COMPLICATIONS: None immediate. TECHNIQUE: Informed consent was obtained from the patient following explanation of the procedure, risks, benefits and alternatives. The patient understands, agrees and consents for the procedure. All questions were addressed. A time out was performed prior to the initiation of the procedure. Maximal barrier sterile  technique utilized including caps, mask, sterile gowns, sterile gloves, large sterile drape, hand hygiene, and sterile prep. FEMORAL ARTERY ACCESS; The RIGHT femoral head was marked fluoroscopically. Under sterile conditions and local anesthesia, the RIGHT common femoral artery access was performed with a micropuncture needle. Under direct ultrasound guidance, the right common femoral was accessed with a micropuncture kit. An ultrasound image was saved for documentation purposes. This allowed for placement of a 5 Fr, 35 cm Brite tip vascular sheath. A limited arteriogram was performed through the side arm of the sheath confirming appropriate access within the RIGHT common femoral artery. Initial attempts at selecting the celiac access using the 5 Fr C2 catheter was unsuccessful. A 5 Fr Mickelson catheter was utilized to select the celiac axis. Once catheterized, a digital subtraction angiogram (DSA) was obtained. Sub selective access into the common, then RIGHT hepatic artery was obtained with a 2.8 Fr renegade hi Flo microcatheter and 0.016 Fathom microwire. Selective DSAs were performed at each level. Cone beam CT was also performed. After appropriate reconstruction and imaging review MAA was delivered into the RIGHT hepatic artery. CONE BEAM CT ANGIOGRAPHY: To utilize vascular tracking software, a cone beam CTA was then performed, and post processing was performed at a separate workstation. Multiplanar image (3D post-processing) reconstructions and MIPs were  obtained to evaluate the vascular anatomy. PURPOSE OF THE ARTERIOGRAMS: No previous catheter-directed angiogram was available. Therefore a new complete diagnostic angiography was performed. The decision to proceed with an interventional procedure was made based on this new diagnostic angiogram. TECHNETIUM 99-m MAA INJECTION: With the microcatheter into the RIGHT hepatic artery, technetium 99-m MAA was injected intra-arterially. The 5 Fr catheter, and all  equipment involved in the radiotracer injection was removed and placed in a separate trash container designated for radiation waste. ARTERIAL CLOSURE: The sheath was removed and hemostasis was achieved by Angio-Seal closure of the RIGHT common femoral artery access. The puncture site was be dressed in a sterile manner. The patient tolerated the procedure well without immediate complication was transferred to the recovery area in good condition. FINDINGS: *Dominant, massive enhancing tumor involving the majority of the RIGHT hepatic lobe. RIGHT hepatic arteriogram and cone beam CTA demonstrating tumor uptake without significant nontarget liver tissue. *CELIAC ARTERIOGRAM: Aortic atherosclerosis with suspected celiac ostial stenosis. Trifurcation into the celiac, LEFT gastric and splenic arteries. *RIGHT HEPATIC ARTERY: Normal origin, caliber, and patency. *BRANCH RIGHT HEPATIC ARTERY: Tumor blush was identified within this target vessel, and a suitable site for injection of radio-embolic agent was identified. IMPRESSION: 1. Pre Y-90 mapping and planning hepatic arteriography. POSITIVE tumor blush consistent with dominant HCC at RIGHT hepatic lobe. 2. Successful infusion of 4.1 mCi of Tc MAA into the RIGHT hepatic artery, via a transfemoral approach. 3. Liver SPECT scan to evaluate and calculate lung shunt fraction was performed in Nuclear Medicine, to be reported separately. PLAN: Pending results of the NM liver SPECT scan, the patient will return to Vascular Interventional Radiology (VIR) for Y-90 radio-embolization in 2-4 weeks. Thom Hall, MD [facility] Radiology Electronically Signed   By: Thom Hall M.D.   On: 10/12/2023 16:45   IR Angiogram Selective Each Additional Vessel Result Date: 10/12/2023 INDICATION: PRE Y90 Briefly, 78 year old male with massive RIGHT hepatocellular carcinoma. Patient presents today for pre Y-90/mapping. EXAM: Title;  TRANSARTERIAL MAA INJECTION FOR Y90 MAPPING Listed procedures; 1. ULTRASOUND-GUIDED ACCESS OF THE RIGHT FEMORAL ARTERY 2. CELIAC, COMMON HEPATIC and RIGHT HEPATIC ARTERIOGRAMS 3. CONE BEAM CTA OF THE LIVER 4. TRANSARTERIAL ADMINISTRATION OF Tc MAA COMPARISON:  CTA abdomen, 10/05/2023. MR abdomen, 06/13/2023. CT AP, 05/19/2023. MEDICATIONS: None RADIOPHARMACEUTICALS:  4.1 mCi Tc61m MAA administered via the RIGHT hepatic artery CONTRAST:  15mL OMNIPAQUE  IOHEXOL  300 MG/ML SOLN, 80mL OMNIPAQUE  IOHEXOL  300 MG/ML SOLN, 40mL OMNIPAQUE  IOHEXOL  300 MG/ML SOLN, 40mL OMNIPAQUE  IOHEXOL  300 MG/ML SOLN ANESTHESIA/SEDATION: Moderate (conscious) sedation was employed during this procedure. A total of Versed  3.5 mg and Fentanyl  175 mcg was administered intravenously. Moderate Sedation Time: 117 minutes. The patient's level of consciousness and vital signs were monitored continuously by radiology nursing throughout the procedure under my direct supervision. FLUOROSCOPY: Radiation Exposure Index and estimated peak skin dose (PSD); Reference air kerma (RAK), 114 mGy. ACCESS: RIGHT, transfemoral. Hemostasis achieved with Angio-Seal closure. COMPLICATIONS: None immediate. TECHNIQUE: Informed consent was obtained from the patient following explanation of the procedure, risks, benefits and alternatives. The patient understands, agrees and consents for the procedure. All questions were addressed. A time out was performed prior to the initiation of the procedure. Maximal barrier sterile technique utilized including caps, mask, sterile gowns, sterile gloves, large sterile drape, hand hygiene, and sterile prep. FEMORAL ARTERY ACCESS; The RIGHT femoral head was marked fluoroscopically. Under sterile conditions and local anesthesia, the RIGHT common femoral artery access was performed with a  micropuncture needle. Under direct ultrasound guidance, the right common femoral was accessed with a micropuncture kit. An ultrasound image was  saved for documentation purposes. This allowed for placement of a 5 Fr, 35 cm Brite tip vascular sheath. A limited arteriogram was performed through the side arm of the sheath confirming appropriate access within the RIGHT common femoral artery. Initial attempts at selecting the celiac access using the 5 Fr C2 catheter was unsuccessful. A 5 Fr Mickelson catheter was utilized to select the celiac axis. Once catheterized, a digital subtraction angiogram (DSA) was obtained. Sub selective access into the common, then RIGHT hepatic artery was obtained with a 2.8 Fr renegade hi Flo microcatheter and 0.016 Fathom microwire. Selective DSAs were performed at each level. Cone beam CT was also performed. After appropriate reconstruction and imaging review MAA was delivered into the RIGHT hepatic artery. CONE BEAM CT ANGIOGRAPHY: To utilize vascular tracking software, a cone beam CTA was then performed, and post processing was performed at a separate workstation. Multiplanar image (3D post-processing) reconstructions and MIPs were obtained to evaluate the vascular anatomy. PURPOSE OF THE ARTERIOGRAMS: No previous catheter-directed angiogram was available. Therefore a new complete diagnostic angiography was performed. The decision to proceed with an interventional procedure was made based on this new diagnostic angiogram. TECHNETIUM 99-m MAA INJECTION: With the microcatheter into the RIGHT hepatic artery, technetium 99-m MAA was injected intra-arterially. The 5 Fr catheter, and all equipment involved in the radiotracer injection was removed and placed in a separate trash container designated for radiation waste. ARTERIAL CLOSURE: The sheath was removed and hemostasis was achieved by Angio-Seal closure of the RIGHT common femoral artery access. The puncture site was be dressed in a sterile manner. The patient tolerated the procedure well without immediate complication was transferred to the recovery area in good condition.  FINDINGS: *Dominant, massive enhancing tumor involving the majority of the RIGHT hepatic lobe. RIGHT hepatic arteriogram and cone beam CTA demonstrating tumor uptake without significant nontarget liver tissue. *CELIAC ARTERIOGRAM: Aortic atherosclerosis with suspected celiac ostial stenosis. Trifurcation into the celiac, LEFT gastric and splenic arteries. *RIGHT HEPATIC ARTERY: Normal origin, caliber, and patency. *BRANCH RIGHT HEPATIC ARTERY: Tumor blush was identified within this target vessel, and a suitable site for injection of radio-embolic agent was identified. IMPRESSION: 1. Pre Y-90 mapping and planning hepatic arteriography. POSITIVE tumor blush consistent with dominant HCC at RIGHT hepatic lobe. 2. Successful infusion of 4.1 mCi of Tc MAA into the RIGHT hepatic artery, via a transfemoral approach. 3. Liver SPECT scan to evaluate and calculate lung shunt fraction was performed in Nuclear Medicine, to be reported separately. PLAN: Pending results of the NM liver SPECT scan, the patient will return to Vascular Interventional Radiology (VIR) for Y-90 radio-embolization in 2-4 weeks. Thom Hall, MD [facility] Radiology Electronically Signed   By: Thom Hall M.D.   On: 10/12/2023 16:45   IR Angiogram Visceral Selective Result Date: 10/12/2023 INDICATION: PRE Y90 Briefly, 78 year old male with massive RIGHT hepatocellular carcinoma. Patient presents today for pre Y-90/mapping. EXAM: Title; TRANSARTERIAL MAA INJECTION FOR Y90 MAPPING Listed procedures; 1. ULTRASOUND-GUIDED ACCESS OF THE RIGHT FEMORAL ARTERY 2. CELIAC, COMMON HEPATIC and RIGHT HEPATIC ARTERIOGRAMS 3. CONE BEAM CTA OF THE LIVER 4. TRANSARTERIAL ADMINISTRATION OF Tc MAA COMPARISON:  CTA abdomen, 10/05/2023. MR abdomen, 06/13/2023. CT AP, 05/19/2023. MEDICATIONS: None RADIOPHARMACEUTICALS:  4.1 mCi Tc75m MAA administered via the RIGHT hepatic artery CONTRAST:  15mL OMNIPAQUE   IOHEXOL  300 MG/ML SOLN, 80mL OMNIPAQUE  IOHEXOL   300 MG/ML SOLN, 40mL OMNIPAQUE  IOHEXOL  300 MG/ML SOLN, 40mL OMNIPAQUE  IOHEXOL  300 MG/ML SOLN ANESTHESIA/SEDATION: Moderate (conscious) sedation was employed during this procedure. A total of Versed  3.5 mg and Fentanyl  175 mcg was administered intravenously. Moderate Sedation Time: 117 minutes. The patient's level of consciousness and vital signs were monitored continuously by radiology nursing throughout the procedure under my direct supervision. FLUOROSCOPY: Radiation Exposure Index and estimated peak skin dose (PSD); Reference air kerma (RAK), 114 mGy. ACCESS: RIGHT, transfemoral. Hemostasis achieved with Angio-Seal closure. COMPLICATIONS: None immediate. TECHNIQUE: Informed consent was obtained from the patient following explanation of the procedure, risks, benefits and alternatives. The patient understands, agrees and consents for the procedure. All questions were addressed. A time out was performed prior to the initiation of the procedure. Maximal barrier sterile technique utilized including caps, mask, sterile gowns, sterile gloves, large sterile drape, hand hygiene, and sterile prep. FEMORAL ARTERY ACCESS; The RIGHT femoral head was marked fluoroscopically. Under sterile conditions and local anesthesia, the RIGHT common femoral artery access was performed with a micropuncture needle. Under direct ultrasound guidance, the right common femoral was accessed with a micropuncture kit. An ultrasound image was saved for documentation purposes. This allowed for placement of a 5 Fr, 35 cm Brite tip vascular sheath. A limited arteriogram was performed through the side arm of the sheath confirming appropriate access within the RIGHT common femoral artery. Initial attempts at selecting the celiac access using the 5 Fr C2 catheter was unsuccessful. A 5 Fr Mickelson catheter was utilized to select the celiac axis. Once catheterized, a digital subtraction angiogram (DSA) was  obtained. Sub selective access into the common, then RIGHT hepatic artery was obtained with a 2.8 Fr renegade hi Flo microcatheter and 0.016 Fathom microwire. Selective DSAs were performed at each level. Cone beam CT was also performed. After appropriate reconstruction and imaging review MAA was delivered into the RIGHT hepatic artery. CONE BEAM CT ANGIOGRAPHY: To utilize vascular tracking software, a cone beam CTA was then performed, and post processing was performed at a separate workstation. Multiplanar image (3D post-processing) reconstructions and MIPs were obtained to evaluate the vascular anatomy. PURPOSE OF THE ARTERIOGRAMS: No previous catheter-directed angiogram was available. Therefore a new complete diagnostic angiography was performed. The decision to proceed with an interventional procedure was made based on this new diagnostic angiogram. TECHNETIUM 99-m MAA INJECTION: With the microcatheter into the RIGHT hepatic artery, technetium 99-m MAA was injected intra-arterially. The 5 Fr catheter, and all equipment involved in the radiotracer injection was removed and placed in a separate trash container designated for radiation waste. ARTERIAL CLOSURE: The sheath was removed and hemostasis was achieved by Angio-Seal closure of the RIGHT common femoral artery access. The puncture site was be dressed in a sterile manner. The patient tolerated the procedure well without immediate complication was transferred to the recovery area in good condition. FINDINGS: *Dominant, massive enhancing tumor involving the majority of the RIGHT hepatic lobe. RIGHT hepatic arteriogram and cone beam CTA demonstrating tumor uptake without significant nontarget liver tissue. *CELIAC ARTERIOGRAM: Aortic atherosclerosis with suspected celiac ostial stenosis. Trifurcation into the celiac, LEFT gastric and splenic arteries. *RIGHT HEPATIC ARTERY: Normal origin, caliber, and patency. *BRANCH RIGHT HEPATIC ARTERY: Tumor blush was  identified within this target vessel, and a suitable site for injection of radio-embolic agent was identified. IMPRESSION: 1. Pre Y-90 mapping and planning hepatic arteriography. POSITIVE tumor blush consistent with dominant HCC at RIGHT hepatic lobe. 2. Successful infusion of 4.1 mCi of Tc 99mTc MAA into the RIGHT  hepatic artery, via a transfemoral approach. 3. Liver SPECT scan to evaluate and calculate lung shunt fraction was performed in Nuclear Medicine, to be reported separately. PLAN: Pending results of the NM liver SPECT scan, the patient will return to Vascular Interventional Radiology (VIR) for Y-90 radio-embolization in 2-4 weeks. Thom Hall, MD [facility] Radiology Electronically Signed   By: Thom Hall M.D.   On: 10/12/2023 16:45   IR Angiogram Selective Each Additional Vessel Result Date: 10/12/2023 INDICATION: PRE Y90 Briefly, 78 year old male with massive RIGHT hepatocellular carcinoma. Patient presents today for pre Y-90/mapping. EXAM: Title; TRANSARTERIAL MAA INJECTION FOR Y90 MAPPING Listed procedures; 1. ULTRASOUND-GUIDED ACCESS OF THE RIGHT FEMORAL ARTERY 2. CELIAC, COMMON HEPATIC and RIGHT HEPATIC ARTERIOGRAMS 3. CONE BEAM CTA OF THE LIVER 4. TRANSARTERIAL ADMINISTRATION OF Tc MAA COMPARISON:  CTA abdomen, 10/05/2023. MR abdomen, 06/13/2023. CT AP, 05/19/2023. MEDICATIONS: None RADIOPHARMACEUTICALS:  4.1 mCi Tc95m MAA administered via the RIGHT hepatic artery CONTRAST:  15mL OMNIPAQUE  IOHEXOL  300 MG/ML SOLN, 80mL OMNIPAQUE  IOHEXOL  300 MG/ML SOLN, 40mL OMNIPAQUE  IOHEXOL  300 MG/ML SOLN, 40mL OMNIPAQUE  IOHEXOL  300 MG/ML SOLN ANESTHESIA/SEDATION: Moderate (conscious) sedation was employed during this procedure. A total of Versed  3.5 mg and Fentanyl  175 mcg was administered intravenously. Moderate Sedation Time: 117 minutes. The patient's level of consciousness and vital signs were monitored continuously by radiology nursing  throughout the procedure under my direct supervision. FLUOROSCOPY: Radiation Exposure Index and estimated peak skin dose (PSD); Reference air kerma (RAK), 114 mGy. ACCESS: RIGHT, transfemoral. Hemostasis achieved with Angio-Seal closure. COMPLICATIONS: None immediate. TECHNIQUE: Informed consent was obtained from the patient following explanation of the procedure, risks, benefits and alternatives. The patient understands, agrees and consents for the procedure. All questions were addressed. A time out was performed prior to the initiation of the procedure. Maximal barrier sterile technique utilized including caps, mask, sterile gowns, sterile gloves, large sterile drape, hand hygiene, and sterile prep. FEMORAL ARTERY ACCESS; The RIGHT femoral head was marked fluoroscopically. Under sterile conditions and local anesthesia, the RIGHT common femoral artery access was performed with a micropuncture needle. Under direct ultrasound guidance, the right common femoral was accessed with a micropuncture kit. An ultrasound image was saved for documentation purposes. This allowed for placement of a 5 Fr, 35 cm Brite tip vascular sheath. A limited arteriogram was performed through the side arm of the sheath confirming appropriate access within the RIGHT common femoral artery. Initial attempts at selecting the celiac access using the 5 Fr C2 catheter was unsuccessful. A 5 Fr Mickelson catheter was utilized to select the celiac axis. Once catheterized, a digital subtraction angiogram (DSA) was obtained. Sub selective access into the common, then RIGHT hepatic artery was obtained with a 2.8 Fr renegade hi Flo microcatheter and 0.016 Fathom microwire. Selective DSAs were performed at each level. Cone beam CT was also performed. After appropriate reconstruction and imaging review MAA was delivered into the RIGHT hepatic artery. CONE BEAM CT ANGIOGRAPHY: To utilize vascular tracking software, a cone beam CTA was then performed, and  post processing was performed at a separate workstation. Multiplanar image (3D post-processing) reconstructions and MIPs were obtained to evaluate the vascular anatomy. PURPOSE OF THE ARTERIOGRAMS: No previous catheter-directed angiogram was available. Therefore a new complete diagnostic angiography was performed. The decision to proceed with an interventional procedure was made based on this new diagnostic angiogram. TECHNETIUM 99-m MAA INJECTION: With the microcatheter into the RIGHT hepatic artery, technetium 99-m MAA was injected intra-arterially. The 5 Fr catheter,  and all equipment involved in the radiotracer injection was removed and placed in a separate trash container designated for radiation waste. ARTERIAL CLOSURE: The sheath was removed and hemostasis was achieved by Angio-Seal closure of the RIGHT common femoral artery access. The puncture site was be dressed in a sterile manner. The patient tolerated the procedure well without immediate complication was transferred to the recovery area in good condition. FINDINGS: *Dominant, massive enhancing tumor involving the majority of the RIGHT hepatic lobe. RIGHT hepatic arteriogram and cone beam CTA demonstrating tumor uptake without significant nontarget liver tissue. *CELIAC ARTERIOGRAM: Aortic atherosclerosis with suspected celiac ostial stenosis. Trifurcation into the celiac, LEFT gastric and splenic arteries. *RIGHT HEPATIC ARTERY: Normal origin, caliber, and patency. *BRANCH RIGHT HEPATIC ARTERY: Tumor blush was identified within this target vessel, and a suitable site for injection of radio-embolic agent was identified. IMPRESSION: 1. Pre Y-90 mapping and planning hepatic arteriography. POSITIVE tumor blush consistent with dominant HCC at RIGHT hepatic lobe. 2. Successful infusion of 4.1 mCi of Tc MAA into the RIGHT hepatic artery, via a transfemoral approach. 3. Liver SPECT scan to evaluate and calculate lung shunt fraction was performed in  Nuclear Medicine, to be reported separately. PLAN: Pending results of the NM liver SPECT scan, the patient will return to Vascular Interventional Radiology (VIR) for Y-90 radio-embolization in 2-4 weeks. Thom Hall, MD [facility] Radiology Electronically Signed   By: Thom Hall M.D.   On: 10/12/2023 16:45   CT ANGIO ABDOMEN W &/OR WO CONTRAST Result Date: 10/12/2023 CLINICAL DATA:  diagnosed with liver cancer 4 months ago. Vascular mapping. EXAM: CT ANGIOGRAPHY ABDOMEN TECHNIQUE: Multidetector CT imaging of the abdomen was performed using the standard protocol during bolus administration of intravenous contrast. Multiplanar reconstructed images and MIPs were obtained and reviewed to evaluate the vascular anatomy. RADIATION DOSE REDUCTION: This exam was performed according to the departmental dose-optimization program which includes automated exposure control, adjustment of the mA and/or kV according to patient size and/or use of iterative reconstruction technique. CONTRAST:  ISOVUE -370 IOPAMIDOL  (ISOVUE -370) INJECTION 76% COMPARISON:  CT AP, 05/19/2018 MRI ABDOMEN, 06/13/2023. CT CHEST, 06/13/2023. FINDINGS: VASCULAR Aorta: Moderate-to-severe burden of calcified aortic atherosclerosis, with 3.2 cm dilatation of the distal infrarenal abdominal aorta. Non calcified posteriorly dominant plaque at the level of the renal arteries. No dissection, vasculitis or significant stenosis. Celiac: Moderate burden of circumferential calcified ostial atherosclerosis with mild ostial stenosis. Otherwise, patent without evidence of aneurysm, dissection, or vasculitis. Tumor supply from the RIGHT hepatic artery. SMA: Mild-to-moderate burden of non-circumferential ostial atherosclerosis. No evidence of aneurysm, dissection, vasculitis or significant stenosis. Renals: 3 renal arteries are present, main and accessory on the LEFT. Mild-to-moderate LEFT renal artery calcified  ostial atherosclerosis. The renal arteries are otherwise patent without evidence of aneurysm, dissection, vasculitis, or fibromuscular dysplasia. IMA: Diminutive but patent, with moderate-to-severe burden of calcified ostial atherosclerosis. No evidence of aneurysm, dissection, or vasculitis Pelvis: Severe burden of bi-iliac calcified atherosclerosis. RIGHT distal common iliac artery aneurysm, measuring up to 1.8 x 2.2 cm (AP by transaxial). No evidence of dissection, vasculitis or significant stenosis. Veins: No obvious venous abnormality within the limitations of this arterial phase study. Review of the MIP images confirms the above findings. NON-VASCULAR Lower chest: Middle lobe calcified granuloma. Severe burden of calcified coronary atherosclerosis. Increased AP thoracic diameter. Hepatobiliary: Smooth contour liver. Dominant, vascular and heterogeneously-dense RIGHT hepatic lobe mass measuring approximately 15.5 x 12.7 x 13.0 cm. No gallstones, gallbladder wall thickening, or biliary dilatation.  Pancreas: No pancreatic ductal dilatation or surrounding inflammatory changes. Spleen: Normal in size.  Calcified splenic granulomas. Adrenals/Urinary Tract: Adrenal glands are unremarkable. Relative cortical thinning, kidneys are otherwise normal. No renal calculi, focal lesion, or hydronephrosis. Bladder is unremarkable. Stomach/Bowel: Stomach is within normal limits. Imaged portions of small bowel and colon are nonobstructed. No evidence of bowel wall thickening, distention, or inflammatory changes. Lymphatic: No enlarged abdominal lymph nodes. Other: No abdominal wall hernia or abnormality. No abdominopelvic ascites. Musculoskeletal: No acute osseous findings. IMPRESSION: VASCULAR 1. Moderate burden of circumferential calcified ostial atherosclerosis at the celiac axis with at least mild ostial stenosis. 2. 3.2 cm distal infrarenal abdominal aortic aneurysm. Recommend surveillance ultrasound in 3 years. Reference:  Journal of Vascular Surgery 67.1 (2018): 2-77. J Am Coll Radiol [phone]. NON-VASCULAR 1. Dominant, vascularized RIGHT hepatic lobe mass, measuring up to 16 cm. Tumor supply via the RIGHT hepatic artery. 2. Scattered thoracoabdominal calcified granulomas, likely sequelae of prior granulomatous disease. Thom Hall, MD [facility] Radiology Electronically Signed   By: Thom Hall M.D.   On: 10/12/2023 14:01    Labs:  CBC: Recent Labs    06/09/23 1303 08/03/23 1135 10/10/23 1056  WBC 9.6 8.6 8.5  HGB 10.3* 10.8* 9.4*  HCT 31.3* 34.5* 30.7*  PLT 367 386 335    COAGS: Recent Labs    08/03/23 1250 10/10/23 1056  INR 1.1 1.6*    BMP: Recent Labs    06/09/23 1303 08/03/23 1135 10/10/23 1056  NA 139 137 144  K 4.2 4.0 3.9  CL 105 102 109  CO2 29 28 23   GLUCOSE 118* 104* 113*  BUN 15 14 11   CALCIUM 9.0 9.0 9.2  CREATININE 1.03 1.02 1.14  GFRNONAA >60 >60 >60    LIVER FUNCTION TESTS: Recent Labs    06/09/23 1303 08/03/23 1135 10/10/23 1056  BILITOT 0.8 1.0 1.6*  AST 24 44* 38  ALT 13 18 15   ALKPHOS 75 169* 103  PROT 6.7 6.9 6.9  ALBUMIN  3.9 3.5 3.2*    TUMOR MARKERS: Recent Labs    06/09/23 1303  CEA 2.17    Assessment and Plan: 78 y.o. 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 He underwent consultation with Dr. Hall on 08/25/23 to discuss treatment options for his [facility] and was deemed an appropriate candidate for hepatic Y-90 radioembolization .  He presents today for the procedure.  He underwent hepatic/visceral arterial roadmapping study on 10/10/2023.Risks and benefits of procedure were discussed with the patient including, but not limited to bleeding, infection, vascular injury or contrast induced renal failure.  This interventional  procedure involves the use of X-rays and because of the nature of the planned procedure, it is possible that we will have prolonged use of X-ray fluoroscopy.  Potential radiation risks to you include (but are not limited to) the following: - A slightly elevated risk for cancer  several years later in life. This risk is typically less than 0.5% percent. This risk is low in comparison to the normal incidence of human cancer, which is 33% for women and 50% for men according to the [employer]. - Radiation induced injury can include skin redness, resembling a rash, tissue breakdown / ulcers and hair loss (which can be temporary or permanent).   The likelihood of either of these occurring  depends on the difficulty of the procedure and whether you are sensitive to radiation due to previous procedures, disease, or genetic conditions.   IF your procedure requires a prolonged use of radiation, you will be notified and given written instructions for further action.  It is your responsibility to monitor the irradiated area for the 2 weeks following the procedure and to notify your physician if you are concerned that you have suffered a radiation induced injury.    All of the patient's questions were answered, patient is agreeable to proceed.  Consent signed and in chart.      Thank you for allowing our service to participate in Isaiah York 's care.  Electronically Signed: D. Franky Rakers, PA-C   10/23/2023, 4:36 PM      I spent a total of 25 minutes    in face to face in clinical consultation, greater than 50% of which was counseling/coordinating care for hepatic arteriogram with Y90 radioembolization

## 2023-10-24 ENCOUNTER — Ambulatory Visit (HOSPITAL_COMMUNITY)
Admission: RE | Admit: 2023-10-24 | Discharge: 2023-10-24 | Disposition: A | Discharge status: Home / Self Care | Source: Ambulatory Visit | Attending: Interventional Radiology | Admitting: Interventional Radiology

## 2023-10-24 ENCOUNTER — Other Ambulatory Visit: Payer: Self-pay | Admitting: Interventional Radiology

## 2023-10-24 ENCOUNTER — Other Ambulatory Visit: Payer: Self-pay

## 2023-10-24 ENCOUNTER — Encounter (HOSPITAL_COMMUNITY): Payer: Self-pay

## 2023-10-24 DIAGNOSIS — Z86718 Personal history of other venous thrombosis and embolism: Secondary | ICD-10-CM | POA: Diagnosis not present

## 2023-10-24 DIAGNOSIS — Z7901 Long term (current) use of anticoagulants: Secondary | ICD-10-CM | POA: Diagnosis not present

## 2023-10-24 DIAGNOSIS — C220 Liver cell carcinoma: Secondary | ICD-10-CM | POA: Diagnosis not present

## 2023-10-24 DIAGNOSIS — F17210 Nicotine dependence, cigarettes, uncomplicated: Secondary | ICD-10-CM | POA: Insufficient documentation

## 2023-10-24 HISTORY — PX: IR ANGIOGRAM VISCERAL SELECTIVE: IMG657

## 2023-10-24 HISTORY — PX: IR US GUIDE VASC ACCESS RIGHT: IMG2390

## 2023-10-24 HISTORY — PX: IR EMBO TUMOR ORGAN ISCHEMIA INFARCT INC GUIDE ROADMAPPING: IMG5449

## 2023-10-24 HISTORY — PX: IR ANGIOGRAM SELECTIVE EACH ADDITIONAL VESSEL: IMG667

## 2023-10-24 LAB — CBC WITH DIFFERENTIAL/PLATELET
Abs Immature Granulocytes: 0.06 K/uL (ref 0.00–0.07)
Basophils Absolute: 0.1 K/uL (ref 0.0–0.1)
Basophils Relative: 1 %
Eosinophils Absolute: 0.4 K/uL (ref 0.0–0.5)
Eosinophils Relative: 4 %
HCT: 28.4 % — ABNORMAL LOW (ref 39.0–52.0)
Hemoglobin: 8.7 g/dL — ABNORMAL LOW (ref 13.0–17.0)
Immature Granulocytes: 1 %
Lymphocytes Relative: 19 %
Lymphs Abs: 1.8 K/uL (ref 0.7–4.0)
MCH: 31.2 pg (ref 26.0–34.0)
MCHC: 30.6 g/dL (ref 30.0–36.0)
MCV: 101.8 fL — ABNORMAL HIGH (ref 80.0–100.0)
Monocytes Absolute: 0.7 K/uL (ref 0.1–1.0)
Monocytes Relative: 8 %
Neutro Abs: 6.5 K/uL (ref 1.7–7.7)
Neutrophils Relative %: 67 %
Platelets: 480 K/uL — ABNORMAL HIGH (ref 150–400)
RBC: 2.79 MIL/uL — ABNORMAL LOW (ref 4.22–5.81)
RDW: 18.6 % — ABNORMAL HIGH (ref 11.5–15.5)
WBC: 9.6 K/uL (ref 4.0–10.5)
nRBC: 0 % (ref 0.0–0.2)

## 2023-10-24 LAB — COMPREHENSIVE METABOLIC PANEL WITH GFR
ALT: 15 U/L (ref 0–44)
AST: 33 U/L (ref 15–41)
Albumin: 3.1 g/dL — ABNORMAL LOW (ref 3.5–5.0)
Alkaline Phosphatase: 91 U/L (ref 38–126)
Anion gap: 7 (ref 5–15)
BUN: 16 mg/dL (ref 8–23)
CO2: 26 mmol/L (ref 22–32)
Calcium: 8.9 mg/dL (ref 8.9–10.3)
Chloride: 107 mmol/L (ref 98–111)
Creatinine, Ser: 1.1 mg/dL (ref 0.61–1.24)
GFR, Estimated: >60 mL/min (ref >60)
Glucose, Bld: 119 mg/dL — ABNORMAL HIGH (ref 70–99)
Potassium: 4.3 mmol/L (ref 3.5–5.1)
Sodium: 140 mmol/L (ref 135–145)
Total Bilirubin: 1 mg/dL (ref 0.0–1.2)
Total Protein: 7.2 g/dL (ref 6.5–8.1)

## 2023-10-24 LAB — PROTIME-INR
INR: 2.4 — ABNORMAL HIGH (ref 0.8–1.2)
Prothrombin Time: 27.3 s — ABNORMAL HIGH (ref 11.4–15.2)

## 2023-10-24 MED ORDER — FENTANYL CITRATE (PF) 100 MCG/2ML IJ SOLN
INTRAMUSCULAR | Status: AC | PRN
  Administered 2023-10-24 (×2): 50 ug via INTRAVENOUS

## 2023-10-24 MED ORDER — SODIUM CHLORIDE 0.9 % IV SOLN: INTRAVENOUS | Status: DC

## 2023-10-24 MED ORDER — IOHEXOL 300 MG/ML  SOLN
100.0000 mL | Freq: Once | INTRAMUSCULAR | Status: AC | PRN
  Administered 2023-10-24: 1 mL via INTRA_ARTERIAL

## 2023-10-24 MED ORDER — IOHEXOL 300 MG/ML  SOLN
100.0000 mL | Freq: Once | INTRAMUSCULAR | Status: AC | PRN
  Administered 2023-10-24: 58 mL via INTRA_ARTERIAL

## 2023-10-24 MED ORDER — GELATIN ABSORBABLE 12-7 MM EX MISC
1.0000 | Freq: Once | CUTANEOUS | Status: AC
  Administered 2023-10-24: 1 via TOPICAL

## 2023-10-24 MED ORDER — SODIUM CHLORIDE 0.9 % IV SOLN
8.0000 mg | Freq: Once | INTRAVENOUS | Status: AC
  Administered 2023-10-24: 8 mg via INTRAVENOUS
  Filled 2023-10-24: qty 4

## 2023-10-24 MED ORDER — TRAMADOL HCL 50 MG PO TABS: 50.0000 mg | ORAL_TABLET | Freq: Four times a day (QID) | ORAL | Status: DC

## 2023-10-24 MED ORDER — SODIUM CHLORIDE 0.9 % IV SOLN
2.0000 g | Freq: Once | INTRAVENOUS | Status: AC
  Administered 2023-10-24: 2 g via INTRAVENOUS

## 2023-10-24 MED ORDER — DEXAMETHASONE SODIUM PHOSPHATE 10 MG/ML IJ SOLN
8.0000 mg | Freq: Once | INTRAMUSCULAR | Status: AC
  Administered 2023-10-24: 8 mg via INTRAVENOUS
  Filled 2023-10-24: qty 1

## 2023-10-24 MED ORDER — SODIUM CHLORIDE 0.9 % IV SOLN
INTRAVENOUS | Status: AC
  Filled 2023-10-24: qty 2

## 2023-10-24 MED ORDER — LIDOCAINE HCL 1 % IJ SOLN
20.0000 mL | Freq: Once | INTRAMUSCULAR | Status: AC
  Administered 2023-10-24: 10 mL via INTRADERMAL

## 2023-10-24 MED ORDER — LIDOCAINE HCL 1 % IJ SOLN
INTRAMUSCULAR | Status: AC
  Filled 2023-10-24: qty 20

## 2023-10-24 MED ORDER — MIDAZOLAM HCL 2 MG/2ML IJ SOLN
INTRAMUSCULAR | Status: AC | PRN
  Administered 2023-10-24 (×2): 1 mg via INTRAVENOUS

## 2023-10-24 MED ORDER — PANTOPRAZOLE SODIUM 40 MG IV SOLR
40.0000 mg | Freq: Once | INTRAVENOUS | Status: AC
  Administered 2023-10-24: 40 mg via INTRAVENOUS
  Filled 2023-10-24: qty 10

## 2023-10-24 MED ORDER — GELATIN ABSORBABLE 12-7 MM EX MISC
CUTANEOUS | Status: AC
  Filled 2023-10-24: qty 1

## 2023-10-24 MED ORDER — MIDAZOLAM HCL 2 MG/2ML IJ SOLN
INTRAMUSCULAR | Status: AC
Start: 2023-10-24 — End: 2023-10-24
  Filled 2023-10-24: qty 4

## 2023-10-24 MED ORDER — FENTANYL CITRATE (PF) 100 MCG/2ML IJ SOLN
INTRAMUSCULAR | Status: AC
Start: 2023-10-24 — End: 2023-10-24
  Filled 2023-10-24: qty 2

## 2023-10-24 MED ORDER — YTTRIUM 90 INJECTION
39.1400 | INJECTION | Freq: Once | INTRAVENOUS | Status: AC
  Administered 2023-10-24: 39.14 via INTRA_ARTERIAL

## 2023-10-24 MED ORDER — IOHEXOL 300 MG/ML  SOLN
50.0000 mL | Freq: Once | INTRAMUSCULAR | Status: AC | PRN
  Administered 2023-10-24: 1 mL

## 2023-10-24 NOTE — Sedation Documentation (Signed)
 Closure device failure holding pressure.

## 2023-10-24 NOTE — Discharge Instructions (Addendum)
 Discharge Instructions:   Please call Interventional Radiology clinic [phone] with any questions or concerns.  You may remove your dressing and shower tomorrow.  Moderate Conscious Sedation, Adult, Care After  This sheet gives you information about how to care for yourself after your procedure. Your health care provider may also give you more specific instructions. If you have problems or questions, contact your health care provider. What can I expect after the procedure? After the procedure, it is common to have: Sleepiness for several hours. Impaired judgment for several hours. Difficulty with balance. Vomiting if you eat too soon. Follow these instructions at home: For the time period you were told by your health care provider: Rest. Do not participate in activities where you could fall or become injured. Do not drive or use machinery. Do not drink alcohol. Do not take sleeping pills or medicines that cause drowsiness. Do not make important decisions or sign legal documents. Do not take care of children on your own. Eating and drinking  Follow the diet recommended by your health care provider. Drink enough fluid to keep your urine pale yellow. If you vomit: Drink water , juice, or soup when you can drink without vomiting. Make sure you have little or no nausea before eating solid foods. General instructions Take over-the-counter and prescription medicines only as told by your health care provider. Have a responsible adult stay with you for the time you are told. It is important to have someone help care for you until you are awake and alert. Do not smoke. Keep all follow-up visits as told by your health care provider. This is important. Contact a health care provider if: You are still sleepy or having trouble with balance after 24 hours. You feel light-headed. You keep feeling nauseous or you keep vomiting. You develop a rash. You have a fever. You have redness or  swelling around the IV site. Get help right away if: You have trouble breathing. You have new-onset confusion at home. Summary After the procedure, it is common to feel sleepy, have impaired judgment, or feel nauseous if you eat too soon. Rest after you get home. Know the things you should not do after the procedure. Follow the diet recommended by your health care provider and drink enough fluid to keep your urine pale yellow. Get help right away if you have trouble breathing or new-onset confusion at home. This information is not intended to replace advice given to you by your health care provider. Make sure you discuss any questions you have with your health care provider. Document Revised: 06/21/2019 Document Reviewed: 01/17/2019 Elsevier Patient Education  2023 Elsevier Inc.    Hepatic Artery Radioembolization, Care After  The following information offers guidance on how to care for yourself after your procedure. Your health care provider may also give you more specific instructions. If you have problems or questions, contact your health care provider. What can I expect after the procedure? After the procedure, it is possible to have: A slight fever for 7 to 10 days. This may be accompanied by pain, nausea, or vomiting, which is referred to as post-embolization syndrome. You may be given medicine to help relieve these symptoms. If your fever gets worse, tell your health care provider. Tiredness (fatigue). Loss of appetite. This should gradually improve after about 1 week. Abdominal pain on your right side. Soreness and tenderness in your groin area where the needle and catheter were placed (puncture site). Follow these instructions at home: Puncture site care Follow instructions  from your health care provider about how to take care of the puncture site. Make sure you: Wash your hands with soap and water  for at least 20 seconds before and after you change your bandage (dressing). If  soap and water  are not available, use hand sanitizer. Change your dressing as told by your health care provider. Check your puncture site every day for signs of infection. Check for: More redness, swelling, or pain. Fluid or blood. Warmth. Pus or a bad smell. Activity Rest as told by your health care provider. Return to your normal activities as told by your health care provider. Ask your health care provider what activities are safe for you. Avoid sitting for a long time without moving. Get up to take short walks every 1-2 hours. This is important to improve blood flow and breathing. Ask for help if you feel weak or unsteady. If you were given a sedative during the procedure, it can affect you for several hours. Do not drive or operate machinery until your health care provider says that it is safe. Do not lift anything that is heavier than 10 lb (4.5 kg), or the limit that you are told, until your health care provider says that it is safe. Medicines Take over-the-counter and prescription medicines only as told by your health care provider. Ask your health care provider if the medicine prescribed to you: Requires you to avoid driving or using machinery. Can cause constipation. You may need to take these actions to prevent or treat constipation: Drink enough fluid to keep your urine pale yellow. Take over-the-counter or prescription medicines. Eat foods that are high in fiber, such as beans, whole grains, and fresh fruits and vegetables. Limit foods that are high in fat and processed sugars, such as fried or sweet foods. General instructions Eat frequent, small meals until your appetite returns. Follow instructions from your health care provider about eating or drinking restrictions. Do not take baths, swim, or use a hot tub until your health care provider approves. You may take showers. Wash your puncture site with mild soap and water , and pat the area dry. Wear compression stockings as told  by your health care provider. These stockings help to prevent blood clots and reduce swelling in your legs. Keep all follow-up visits. This is important. You may need to have blood tests and imaging tests. Contact a health care provider if: You have any of these signs of infection: More redness, swelling, or pain around your puncture site. Fluid or blood coming from your puncture site. Warmth coming from your puncture site. Pus or a bad smell coming from your puncture site. You have pain that: Gets worse. Does not get better with medicine. Feels like very bad heartburn. Is in the middle of your abdomen, above your belly button. You have any signs of infection or liver failure, such as: Your skin or the white parts of your eyes turn yellow (jaundice). The color of your urine changes to dark brown. The color of your stool (feces) changes to light yellow. Your abdominal measurement (girth) increases in a short period of time. You gain more than 5 lb (2.3 kg) in a short period of time. Get help right away if: You have a fever that lasts more than 10 days or is higher than what your health care provider told you to expect. You develop any of the following in your legs: Pain. Swelling. Skin that is cold or pale or turns blue. You have chest pain. You have  blood in your vomit, saliva, or stool. You have trouble breathing. These symptoms may represent a serious problem that is an emergency. Do not wait to see if the symptoms will go away. Get medical help right away. Call your local emergency services (911 in the [place] Do not drive yourself to the hospital. Summary After the procedure, it is possible to have a slight fever for up to 7-10 days, tiredness, loss of appetite, abdominal pain on the right side, and groin tenderness where the catheter was placed. Do not come in close contact with people for up to a week after your procedure, as told by your health care provider. Follow instructions  from your health care provider about how to take care of the puncture site. Contact a health care provider if you have any signs of infection. Get help right away if you develop pain or swelling in your legs or if your legs feel cool or look pale. This information is not intended to replace advice given to you by your health care provider. Make sure you discuss any questions you have with your health care provider. Document Revised: 01/26/2020 Document Reviewed: 01/26/2020 Elsevier Patient Education  2022 Elsevier Inc.        RADIATION PRECAUTIONS:  For up to a week after your procedure, there will be a small amount of radioactivity near your liver. This is not especially dangerous to other people. However, as told by your health care provider, you should follow these precautions for 7 days: Do not come in close contact with people. Do not sleep in the same bed as someone else. Do not hold children or babies. Do not have contact with pregnant women.  Post Y-90 Radioembolization Discharge Instructions  You have been given a radioactive material during your procedure.  While it is safe for you to be discharged home from the hospital, you need to proceed directly home.    Do not use public transportation, including air travel, lasting more than 2 hours for 1 week.  Avoid crowded public places for 1 week.  Adult visitors should try to avoid close contact with you for 1 week.    Children and pregnant females should not visit or have close contact with you for 1 week.  Items that you touch are not radioactive.  Do not sleep in the same bed as your partner for 1 week, and a condom should be used for sexual activity during the first 24 hours.  Your blood may be radioactive and caution should be used if any bleeding occurs during the recovery period.  Body fluids may be radioactive for 24 hours.  Wash your hands after voiding.  Men should sit to urinate.  Dispose of any soiled materials  (flush down toilet or place in trash at home) during the first day.  Drink 6 to 8 glasses of fluids per day for 5 days to hydrate yourself.  If you need to see a doctor during the first week, you must let them know that you were treated with yttrium-90 microspheres, and will be slightly radioactive.  They can call Interventional Radiology [phone] with any questions.

## 2023-10-24 NOTE — Progress Notes (Signed)
 1800 100 cc clear yellow urine condom catheter bag, urine in the bed, bed change large amount.

## 2023-10-24 NOTE — Procedures (Signed)
 Vascular and Interventional Radiology Procedure Note  Patient: Isaiah York DOB: 1945/03/27 Medical Record Number: [id number] Note Date/Time: 10/24/23 1:44 PM   Performing Physician: Thom Hall, MD Assistant(s): None  Diagnosis: HCC.   Procedure:  HEPATIC ARTERIOGRAPHY  Y90 RADIOEMBOLIZATION OF LIVER TUMOR  Anesthesia: Conscious Sedation Complications: None Estimated Blood Loss: Minimal Specimens: None  Findings:  - access via the RIGHT femoral artery. - diagnostic hepatic angiography was performed.   - successful Y90 injection into the RIGHT hepatic artery. - failed AngioSeal closure at the R groin with distal RLE pulses at the end of the case.  Plan: - Post sheath removal precautions.  - Bedrest with RLE straight x4 hrs.  Final report to follow once all images are reviewed and compared with previous studies.  See detailed dictation with images in PACS. The patient tolerated the procedure well without incident or complication and was returned to Recovery in stable condition.    Thom Hall, MD [facility] Interventional Radiology [facility] Radiology   Pager. [phone] Clinic. [phone]

## 2023-10-24 NOTE — Sedation Documentation (Signed)
 Transporting pt to Nuc Med.

## 2023-10-24 NOTE — Progress Notes (Signed)
 Pt tx from short stay due to pt have to flat til 8Pm.  Alert [id number].VSS

## 2023-11-08 ENCOUNTER — Other Ambulatory Visit: Payer: Self-pay

## 2023-11-08 DIAGNOSIS — C220 Liver cell carcinoma: Secondary | ICD-10-CM

## 2023-11-09 ENCOUNTER — Inpatient Hospital Stay: Admitting: Hematology

## 2023-11-09 ENCOUNTER — Inpatient Hospital Stay: Attending: Physician Assistant

## 2023-11-09 VITALS — BP 118/78 | HR 103 | Temp 98.4°F | Resp 18 | Ht 65.0 in | Wt 98.5 lb

## 2023-11-09 DIAGNOSIS — C220 Liver cell carcinoma: Secondary | ICD-10-CM | POA: Insufficient documentation

## 2023-11-09 DIAGNOSIS — D649 Anemia, unspecified: Secondary | ICD-10-CM | POA: Insufficient documentation

## 2023-11-09 DIAGNOSIS — R630 Anorexia: Secondary | ICD-10-CM | POA: Diagnosis not present

## 2023-11-09 DIAGNOSIS — M4854XA Collapsed vertebra, not elsewhere classified, thoracic region, initial encounter for fracture: Secondary | ICD-10-CM | POA: Diagnosis not present

## 2023-11-09 DIAGNOSIS — Z79899 Other long term (current) drug therapy: Secondary | ICD-10-CM | POA: Insufficient documentation

## 2023-11-09 DIAGNOSIS — J449 Chronic obstructive pulmonary disease, unspecified: Secondary | ICD-10-CM | POA: Insufficient documentation

## 2023-11-09 LAB — CBC WITH DIFFERENTIAL (CANCER CENTER ONLY)
Abs Immature Granulocytes: 0.02 K/uL (ref 0.00–0.07)
Basophils Absolute: 0.1 K/uL (ref 0.0–0.1)
Basophils Relative: 1 %
Eosinophils Absolute: 0.3 K/uL (ref 0.0–0.5)
Eosinophils Relative: 5 %
HCT: 29.6 % — ABNORMAL LOW (ref 39.0–52.0)
Hemoglobin: 9.1 g/dL — ABNORMAL LOW (ref 13.0–17.0)
Immature Granulocytes: 0 %
Lymphocytes Relative: 9 %
Lymphs Abs: 0.6 K/uL — ABNORMAL LOW (ref 0.7–4.0)
MCH: 30.5 pg (ref 26.0–34.0)
MCHC: 30.7 g/dL (ref 30.0–36.0)
MCV: 99.3 fL (ref 80.0–100.0)
Monocytes Absolute: 0.6 K/uL (ref 0.1–1.0)
Monocytes Relative: 10 %
Neutro Abs: 4.5 K/uL (ref 1.7–7.7)
Neutrophils Relative %: 75 %
Platelet Count: 328 K/uL (ref 150–400)
RBC: 2.98 MIL/uL — ABNORMAL LOW (ref 4.22–5.81)
RDW: 16.7 % — ABNORMAL HIGH (ref 11.5–15.5)
WBC Count: 6.1 K/uL (ref 4.0–10.5)
nRBC: 0 % (ref 0.0–0.2)

## 2023-11-09 LAB — CMP (CANCER CENTER ONLY)
ALT: 8 U/L (ref 0–44)
AST: 25 U/L (ref 15–41)
Albumin: 3.6 g/dL (ref 3.5–5.0)
Alkaline Phosphatase: 72 U/L (ref 38–126)
Anion gap: 6 (ref 5–15)
BUN: 10 mg/dL (ref 8–23)
CO2: 29 mmol/L (ref 22–32)
Calcium: 8.8 mg/dL — ABNORMAL LOW (ref 8.9–10.3)
Chloride: 106 mmol/L (ref 98–111)
Creatinine: 1.1 mg/dL (ref 0.61–1.24)
GFR, Estimated: >60 mL/min (ref >60)
Glucose, Bld: 118 mg/dL — ABNORMAL HIGH (ref 70–99)
Potassium: 4.2 mmol/L (ref 3.5–5.1)
Sodium: 141 mmol/L (ref 135–145)
Total Bilirubin: 0.6 mg/dL (ref 0.0–1.2)
Total Protein: 6.9 g/dL (ref 6.5–8.1)

## 2023-11-09 NOTE — Progress Notes (Signed)
 START ON PATHWAY REGIMEN - Hepatobiliary     Cycle 1: A cycle is 28 days:     Tremelimumab-actl      Durvalumab    Cycles 2 and beyond: A cycle is every 28 days:     Durvalumab   **Always confirm dose/schedule in your pharmacy ordering system**  Patient Characteristics: Hepatocellular Carcinoma, Unresectable or Nonsurgical Candidate, Locally Advanced or Metastatic Disease Not Amenable to Locoregional Therapy, Systemic Therapy, First Line, No Prior Transplant and Candidate for Immunotherapy Hepatobiliary Disease Type: Hepatocellular Carcinoma Line of Therapy: First Line Intent of Therapy: Non-Curative / Palliative Intent, Discussed with Patient

## 2023-11-09 NOTE — Assessment & Plan Note (Signed)
 T2N0M0, stage II - Presented with abdominal pain, nausea and weight loss. -CT and MRI showed a large 13 cm mass in the right lobe of liver, no nodal or distant metastasis. -Baseline AFP 31.8 -Liver biopsy on Aug 03, 2023 confirmed a well-differentiated hepatocellular carcinoma. -Pt was seen by surgery  -He underwent Y90 on 10/25/2023  - Given the large size of tumor, I also recommend systemic treatment with immunotherapy

## 2023-11-09 NOTE — Progress Notes (Signed)
 [facility]   Telephone:([phone] Fax:([phone]   Clinic Follow up Note   Patient Care Team: Morgan Drivers, MD as PCP - General (Family Medicine) Ardis, Evalene CROME, RN as Oncology Nurse Navigator  Date of Service:  11/09/2023  CHIEF COMPLAINT: f/u of HCC  CURRENT THERAPY:  Pending immunotherapy  Oncology History   Hepatocellular carcinoma (HCC) T2N0M0, stage II - Presented with abdominal pain, nausea and weight loss. -CT and MRI showed a large 13 cm mass in the right lobe of liver, no nodal or distant metastasis. -Baseline AFP 31.8 -Liver biopsy on Aug 03, 2023 confirmed a well-differentiated hepatocellular carcinoma. -Pt was seen by surgery  -He underwent Y90 on 10/25/2023  - Given the large size of tumor, I also recommend systemic treatment with immunotherapy  Assessment & Plan Hepatocellular carcinoma Hepatocellular carcinoma with a large tumor, status post Y90 treatment.  No current symptoms such as pain or fever. Surgery is not currently an option due to the size of the tumor and concerns about recovery. Immunotherapy is considered to potentially shrink the tumor further, I recommend Durvalumab every 4 weeks and Imjudo on C1. Discussed the potential side effects of immunotherapy, including mild to severe side effects such as joint pain, skin rash, fatigue, pneumonia-like symptoms, thyroid  dysfunction, abnormal liver function, type 1 diabetes, and rare cases of heart attack. He is a candidate for immunotherapy despite low body weight, as dosing is based on weight and height. - Initiate immunotherapy with combination drugs, starting mid-September, pending insurance approval. - Monitor for side effects and disease progression with scans every 3-4 months. - Check thyroid  function with each treatment. - Communicate with Dr. Debora regarding the treatment plan.  If patient responds well to Y90 and immunotherapy, surgery may be an option. - Schedule a chemo class  for education on immunotherapy. - Repeat AFP levels monthly.  Chronic obstructive pulmonary disease (COPD) Chronic obstructive pulmonary disease. Discussed potential risk of immunotherapy exacerbating lung symptoms, such as pneumonia-like symptoms, cough, and shortness of breath.  Anemia Anemia with a current blood count of 9.1. No signs of bleeding or melena reported. - Monitor blood counts regularly.  Plan - He has recovered well from Y90 treatment. - I recommend immunotherapy with durvalumab and Imjudo, and to start in 2-3 weeks -will copy Dr. Debora    SUMMARY OF ONCOLOGIC HISTORY: Oncology History  Hepatocellular carcinoma (HCC)  06/14/2023 Initial Diagnosis   Hepatocellular carcinoma (HCC)   08/03/2023 Cancer Staging   Staging form: Liver, AJCC 8th Edition - Clinical stage from 08/03/2023: Stage II (cT2, cN0, cM0) - Signed by Lanny Callander, MD on 08/07/2023      Discussed the use of AI scribe software for clinical note transcription with the patient, who gave verbal consent to proceed.  History of Present Illness Isaiah York is a 78 year old male with hepatocellular carcinoma who presents for follow-up after initial treatment. He is accompanied by his daughters. He was referred by Dr. Debora for evaluation and management of his hepatocellular carcinoma.  He has undergone a mapping procedure and one treatment session for hepatocellular carcinoma. He reports no pain, fever, or other adverse symptoms post-treatment.  He has chronic obstructive pulmonary disease and phlebitis. No history of autoimmune diseases.  He experiences no issues with eating or sleeping. His daughters note difficulty in gaining weight, with a previous incorrect weight recording of 110 pounds.  No black stool or bleeding.  He lives with his daughters and wife, with one daughter working  from home to provide assistance.     All other systems were reviewed with the patient and are negative.  MEDICAL HISTORY:   Past Medical History:  Diagnosis Date   DVT (deep venous thrombosis) (HCC)    HTN (hypertension)    Hyperlipidemia     SURGICAL HISTORY: Past Surgical History:  Procedure Laterality Date   IR ANGIOGRAM SELECTIVE EACH ADDITIONAL VESSEL  10/10/2023   IR ANGIOGRAM SELECTIVE EACH ADDITIONAL VESSEL  10/10/2023   IR ANGIOGRAM SELECTIVE EACH ADDITIONAL VESSEL  10/24/2023   IR ANGIOGRAM SELECTIVE EACH ADDITIONAL VESSEL  10/24/2023   IR ANGIOGRAM VISCERAL SELECTIVE  10/10/2023   IR ANGIOGRAM VISCERAL SELECTIVE  10/24/2023   IR EMBO TUMOR ORGAN ISCHEMIA INFARCT INC GUIDE ROADMAPPING  10/24/2023   IR RADIOLOGIST EVAL & MGMT  08/25/2023   IR US  GUIDE VASC ACCESS RIGHT  10/10/2023   IR US  GUIDE VASC ACCESS RIGHT  10/24/2023    I have reviewed the social history and family history with the patient and they are unchanged from previous note.  ALLERGIES:  is allergic to tape.  MEDICATIONS:  Current Outpatient Medications  Medication Sig Dispense Refill   amLODipine (NORVASC) 5 MG tablet Take 5 mg by mouth daily.     folic acid (FOLVITE) 1 MG tablet Take 1 mg by mouth daily.     losartan (COZAAR) 50 MG tablet Take 50 mg by mouth daily.     pravastatin (PRAVACHOL) 10 MG tablet Take 10 mg by mouth daily.  3   warfarin (COUMADIN ) 2 MG tablet Take 1 tablet (2 mg total) by mouth daily at 6 PM. (Patient taking differently: Take 2 mg by mouth daily at 6 PM. Taking 2 mg on Monday and Thursday and 1.5 mg all other days of week.) 30 tablet 4   No current facility-administered medications for this visit.    PHYSICAL EXAMINATION: ECOG PERFORMANCE STATUS: 1 - Symptomatic but completely ambulatory  Vitals:   11/09/23 1321  BP: 118/78  Pulse: (!) 103  Resp: 18  Temp: 98.4 F (36.9 C)  SpO2: 98%   Wt Readings from Last 3 Encounters:  11/09/23 98 lb 8 oz (44.7 kg)  10/24/23 110 lb (49.9 kg)  08/08/23 100 lb 6.4 oz (45.5 kg)     GENERAL:alert, no distress and comfortable SKIN: skin color, texture, turgor  are normal, no rashes or significant lesions EYES: normal, Conjunctiva are pink and non-injected, sclera clear Musculoskeletal:no cyanosis of digits and no clubbing  NEURO: alert & oriented x 3 with fluent speech, no focal motor/sensory deficits  Physical Exam    LABORATORY DATA:  I have reviewed the data as listed    Latest Ref Rng & Units 11/09/2023   12:57 PM 10/24/2023   11:35 AM 10/10/2023   10:56 AM  CBC  WBC 4.0 - 10.5 K/uL 6.1  9.6  8.5   Hemoglobin 13.0 - 17.0 g/dL 9.1  8.7  9.4   Hematocrit 39.0 - 52.0 % 29.6  28.4  30.7   Platelets 150 - 400 K/uL 328  480  335         Latest Ref Rng & Units 11/09/2023   12:57 PM 10/24/2023   11:35 AM 10/10/2023   10:56 AM  CMP  Glucose 70 - 99 mg/dL 881  880  886   BUN 8 - 23 mg/dL 10  16  11    Creatinine 0.61 - 1.24 mg/dL 8.89  8.89  8.85   Sodium 135 - 145 mmol/L 141  140  144   Potassium 3.5 - 5.1 mmol/L 4.2  4.3  3.9   Chloride 98 - 111 mmol/L 106  107  109   CO2 22 - 32 mmol/L 29  26  23    Calcium 8.9 - 10.3 mg/dL 8.8  8.9  9.2   Total Protein 6.5 - 8.1 g/dL 6.9  7.2  6.9   Total Bilirubin 0.0 - 1.2 mg/dL 0.6  1.0  1.6   Alkaline Phos 38 - 126 U/L 72  91  103   AST 15 - 41 U/L 25  33  38   ALT 0 - 44 U/L 8  15  15        RADIOGRAPHIC STUDIES: I have personally reviewed the radiological images as listed and agreed with the findings in the report. No results found.    No orders of the defined types were placed in this encounter.  All questions were answered. The patient knows to call the clinic with any problems, questions or concerns. No barriers to learning was detected. The total time spent in the appointment was 40 minutes, including review of chart and various tests results, discussions about plan of care and coordination of care plan     Onita Mattock, MD 11/09/2023

## 2023-11-10 ENCOUNTER — Telehealth: Payer: Self-pay | Admitting: Hematology

## 2023-11-10 NOTE — Telephone Encounter (Signed)
 LVM  regarding appts coming  up

## 2023-11-11 ENCOUNTER — Other Ambulatory Visit: Payer: Self-pay

## 2023-11-12 ENCOUNTER — Other Ambulatory Visit: Payer: Self-pay

## 2023-11-14 ENCOUNTER — Other Ambulatory Visit: Payer: Self-pay

## 2023-11-19 ENCOUNTER — Other Ambulatory Visit: Payer: Self-pay

## 2023-11-22 ENCOUNTER — Telehealth: Payer: Self-pay

## 2023-11-22 ENCOUNTER — Inpatient Hospital Stay

## 2023-11-22 DIAGNOSIS — C220 Liver cell carcinoma: Secondary | ICD-10-CM

## 2023-11-22 NOTE — Progress Notes (Signed)
 Pharmacist Chemotherapy Monitoring - Initial Assessment    Anticipated start date: 11/29/23   The following has been reviewed per standard work regarding the patient's treatment regimen: The patient's diagnosis, treatment plan and drug doses, and organ/hematologic function Lab orders and baseline tests specific to treatment regimen  The treatment plan start date, drug sequencing, and pre-medications Prior authorization status  Patient's documented medication list, including drug-drug interaction screen and prescriptions for anti-emetics and supportive care specific to the treatment regimen The drug concentrations, fluid compatibility, administration routes, and timing of the medications to be used The patient's access for treatment and lifetime cumulative dose history, if applicable  The patient's medication allergies and previous infusion related reactions, if applicable   Changes made to treatment plan:  treatment plan date  Follow up needed:  Zofran  script not sent to pharmacy  Isaiah York, RPH, 11/22/2023  4:00 PM

## 2023-11-22 NOTE — Telephone Encounter (Signed)
 Informed daughter that the education was r/s to 11-29-23 while he is receiving his treatment. Daughter [name] was grateful  for this as he did not want to come for the education session today.

## 2023-11-22 NOTE — Telephone Encounter (Signed)
 LM for Isaiah York to call back to the office to r/s his appointment for his education session that was at 1100 today 11-22-23.

## 2023-11-28 NOTE — Assessment & Plan Note (Deleted)
 T2N0M0, stage II - Presented with abdominal pain, nausea and weight loss. -CT and MRI showed a large 13 cm mass in the right lobe of liver, no nodal or distant metastasis. -Baseline AFP 31.8 -Liver biopsy on Aug 03, 2023 confirmed a well-differentiated hepatocellular carcinoma. -Pt was seen by surgery  -He underwent Y90 on 10/25/2023  - Given the large size of tumor, I also recommend systemic treatment with immunotherapy, plan to start Durvalumab and Imjudo on 9/24.

## 2023-11-29 ENCOUNTER — Telehealth: Payer: Self-pay | Admitting: Hematology

## 2023-11-29 ENCOUNTER — Encounter: Payer: Self-pay | Admitting: Hematology

## 2023-11-29 ENCOUNTER — Other Ambulatory Visit

## 2023-11-29 ENCOUNTER — Inpatient Hospital Stay

## 2023-11-29 ENCOUNTER — Inpatient Hospital Stay: Admitting: Hematology

## 2023-11-29 ENCOUNTER — Telehealth: Payer: Self-pay

## 2023-11-29 ENCOUNTER — Inpatient Hospital Stay (HOSPITAL_BASED_OUTPATIENT_CLINIC_OR_DEPARTMENT_OTHER)

## 2023-11-29 ENCOUNTER — Other Ambulatory Visit: Payer: Self-pay

## 2023-11-29 VITALS — BP 94/60 | HR 115 | Temp 98.8°F | Resp 15 | Ht 65.0 in | Wt 86.8 lb

## 2023-11-29 DIAGNOSIS — C220 Liver cell carcinoma: Secondary | ICD-10-CM

## 2023-11-29 DIAGNOSIS — J449 Chronic obstructive pulmonary disease, unspecified: Secondary | ICD-10-CM | POA: Diagnosis not present

## 2023-11-29 DIAGNOSIS — M4854XA Collapsed vertebra, not elsewhere classified, thoracic region, initial encounter for fracture: Secondary | ICD-10-CM | POA: Diagnosis not present

## 2023-11-29 DIAGNOSIS — D649 Anemia, unspecified: Secondary | ICD-10-CM | POA: Diagnosis not present

## 2023-11-29 DIAGNOSIS — Z79899 Other long term (current) drug therapy: Secondary | ICD-10-CM | POA: Diagnosis not present

## 2023-11-29 LAB — CBC WITH DIFFERENTIAL (CANCER CENTER ONLY)
Abs Immature Granulocytes: 0.02 K/uL (ref 0.00–0.07)
Basophils Absolute: 0.1 K/uL (ref 0.0–0.1)
Basophils Relative: 1 %
Eosinophils Absolute: 0.1 K/uL (ref 0.0–0.5)
Eosinophils Relative: 1 %
HCT: 34.5 % — ABNORMAL LOW (ref 39.0–52.0)
Hemoglobin: 11.1 g/dL — ABNORMAL LOW (ref 13.0–17.0)
Immature Granulocytes: 0 %
Lymphocytes Relative: 7 %
Lymphs Abs: 0.5 K/uL — ABNORMAL LOW (ref 0.7–4.0)
MCH: 30 pg (ref 26.0–34.0)
MCHC: 32.2 g/dL (ref 30.0–36.0)
MCV: 93.2 fL (ref 80.0–100.0)
Monocytes Absolute: 0.6 K/uL (ref 0.1–1.0)
Monocytes Relative: 9 %
Neutro Abs: 5.5 K/uL (ref 1.7–7.7)
Neutrophils Relative %: 82 %
Platelet Count: 439 K/uL — ABNORMAL HIGH (ref 150–400)
RBC: 3.7 MIL/uL — ABNORMAL LOW (ref 4.22–5.81)
RDW: 15.5 % (ref 11.5–15.5)
WBC Count: 6.7 K/uL (ref 4.0–10.5)
nRBC: 0 % (ref 0.0–0.2)

## 2023-11-29 LAB — CMP (CANCER CENTER ONLY)
ALT: 7 U/L (ref 0–44)
AST: 36 U/L (ref 15–41)
Albumin: 3.7 g/dL (ref 3.5–5.0)
Alkaline Phosphatase: 66 U/L (ref 38–126)
Anion gap: 8 (ref 5–15)
BUN: 33 mg/dL — ABNORMAL HIGH (ref 8–23)
CO2: 25 mmol/L (ref 22–32)
Calcium: 9.3 mg/dL (ref 8.9–10.3)
Chloride: 106 mmol/L (ref 98–111)
Creatinine: 1.29 mg/dL — ABNORMAL HIGH (ref 0.61–1.24)
GFR, Estimated: 57 mL/min — ABNORMAL LOW (ref >60)
Glucose, Bld: 158 mg/dL — ABNORMAL HIGH (ref 70–99)
Potassium: 4.5 mmol/L (ref 3.5–5.1)
Sodium: 139 mmol/L (ref 135–145)
Total Bilirubin: 0.6 mg/dL (ref 0.0–1.2)
Total Protein: 7.5 g/dL (ref 6.5–8.1)

## 2023-11-29 LAB — TSH: TSH: 0.888 u[IU]/mL (ref 0.350–4.500)

## 2023-11-29 MED ORDER — HYDROCODONE-ACETAMINOPHEN 5-325 MG PO TABS: 0.5000 | ORAL_TABLET | Freq: Three times a day (TID) | ORAL | 0 refills | Status: DC | PRN

## 2023-11-29 MED ORDER — MEGESTROL ACETATE 625 MG/5ML PO SUSP: 625.0000 mg | Freq: Every day | ORAL | 0 refills | Status: DC

## 2023-11-29 NOTE — Assessment & Plan Note (Signed)
 T2N0M0, stage II - Presented with abdominal pain, nausea and weight loss. -CT and MRI showed a large 13 cm mass in the right lobe of liver, no nodal or distant metastasis. -Baseline AFP 31.8 -Liver biopsy on Aug 03, 2023 confirmed a well-differentiated hepatocellular carcinoma. -Pt was seen by surgery  -He underwent Y90 on 10/25/2023  - Given the large size of tumor, I also recommend systemic treatment with immunotherapy, plan to start Durvalumab and Imjudo on 9/24.

## 2023-11-29 NOTE — Telephone Encounter (Signed)
 Received telephone call from the patient's daughter requesting patient's appointments be canceled due to the patient not feeling well.  Spoke with the patient. Patient expressed severe back pain and weakness in both legs. Encouraged patient to come in to be seen. Patient agreed to come in. Appointments have been added back. Dr. Lanny and the infusion team have been made aware.

## 2023-11-29 NOTE — Progress Notes (Signed)
 [facility]   Telephone:(336) [phone] Fax:(336) [phone]   Clinic Follow up Note   Patient Care Team: Morgan Drivers, MD as PCP - General (Family Medicine) Ardis, Evalene CROME, RN as Oncology Nurse Navigator  Date of Service:  11/29/2023  CHIEF COMPLAINT: back pain   CURRENT THERAPY:  Pending durvalumab and Imjudo   Oncology History   Hepatocellular carcinoma (HCC) T2N0M0, stage II - Presented with abdominal pain, nausea and weight loss. -CT and MRI showed a large 13 cm mass in the right lobe of liver, no nodal or distant metastasis. -Baseline AFP 31.8 -Liver biopsy on Aug 03, 2023 confirmed a well-differentiated hepatocellular carcinoma. -Pt was seen by surgery  -He underwent Y90 on 10/25/2023  - Given the large size of tumor, I also recommend systemic treatment with immunotherapy, plan to start Durvalumab and Imjudo on 9/24.   Assessment & Plan Hepatocellular carcinoma Follow-up for hepatocellular carcinoma. Treatment postponed due to severe back pain. Liver MRI last performed in April; new MRI planned to establish baseline. - Order liver MRI to establish new baseline - Cancel today's infusion due to severe back pain  Severe back pain, possible lumbar vertebral compression fracture Severe back pain for one week, radiating to the stomach, constant and worsens with movement. Tenderness on spine suggests lumbar vertebral compression fracture. Differential includes osteopenia or osteoporosis-related fracture. - Order lumbar spine MRI STAT to evaluate for compression fracture - Order lumbar spine X-ray - Consider referral to orthopedic surgeon or interventional radiology for potential treatment options if compression fracture is confirmed - Prescribe hydrocodone  with acetaminophen  for pain management, starting with half a tablet and increasing to a full tablet if needed - Advise to monitor for constipation and use Miralax if necessary  Unintentional weight loss and  poor oral intake Significant weight loss and poor oral intake impacting ability to undergo cancer treatment. Preference for liquid appetite stimulant with small risk of blood clots. - Prescribe liquid appetite stimulant once daily - Encourage high-protein, high-calorie foods and nutritional supplements like Ensure or homemade shakes  Borderline hypotension likely secondary to poor oral intake Blood pressure at 94/60 mmHg, likely due to poor oral intake. Experiences dizziness upon standing. Current antihypertensive medications include amlodipine and bosentan. - Hold amlodipine and bosentan for a few days - Advise to monitor blood pressure at home and withhold antihypertensive medication if systolic blood pressure is below 130 mmHg - Resume antihypertensive medication if systolic blood pressure exceeds 140 mmHg  Plan - I ordered a stat lumbar MRI to evaluate his acute back pain, will also obtain abdominal MRI with and without contrast as a new baseline - Cancel his infusion today - Phone call after his MRI. - I called in Megace  for his anorexia   SUMMARY OF ONCOLOGIC HISTORY: Oncology History  Hepatocellular carcinoma (HCC)  06/14/2023 Initial Diagnosis   Hepatocellular carcinoma (HCC)   08/03/2023 Cancer Staging   Staging form: Liver, AJCC 8th Edition - Clinical stage from 08/03/2023: Stage II (cT2, cN0, cM0) - Signed by Lanny Callander, MD on 08/07/2023   11/29/2023 -  Chemotherapy   Patient is on Treatment Plan : Hepatocellular Carcinoma Tremelimumab-actl C1 D1 + Durvalumab q28d         Discussed the use of AI scribe software for clinical note transcription with the patient, who gave verbal consent to proceed.  History of Present Illness Isaiah York is a 78 year old male with hepatocellular carcinoma who presents with severe back pain.  He experiences severe back pain  primarily in the middle of his back for about a week, worsening over time and making it difficult to stand. The pain  began after sleeping on his side and is described as intense. He rates his pain as a 'four or five,' but his caregiver suggests it is likely higher, especially when changing positions. Tylenol  has been ineffective in alleviating his symptoms.  He has a poor appetite and noticeable weight loss, eating minimally with a diet of 'two scrambled eggs a day.' He has not been taking nutritional supplements like Ensure or Boost. He experiences dizziness upon standing, possibly related to low blood pressure, which was noted at 94/60. His current medications include amlodipine and bosentan. He drinks about five or six glasses of milk daily.     All other systems were reviewed with the patient and are negative.  MEDICAL HISTORY:  Past Medical History:  Diagnosis Date   DVT (deep venous thrombosis) (HCC)    HTN (hypertension)    Hyperlipidemia     SURGICAL HISTORY: Past Surgical History:  Procedure Laterality Date   IR ANGIOGRAM SELECTIVE EACH ADDITIONAL VESSEL  10/10/2023   IR ANGIOGRAM SELECTIVE EACH ADDITIONAL VESSEL  10/10/2023   IR ANGIOGRAM SELECTIVE EACH ADDITIONAL VESSEL  10/24/2023   IR ANGIOGRAM SELECTIVE EACH ADDITIONAL VESSEL  10/24/2023   IR ANGIOGRAM VISCERAL SELECTIVE  10/10/2023   IR ANGIOGRAM VISCERAL SELECTIVE  10/24/2023   IR EMBO TUMOR ORGAN ISCHEMIA INFARCT INC GUIDE ROADMAPPING  10/24/2023   IR RADIOLOGIST EVAL & MGMT  08/25/2023   IR US  GUIDE VASC ACCESS RIGHT  10/10/2023   IR US  GUIDE VASC ACCESS RIGHT  10/24/2023    I have reviewed the social history and family history with the patient and they are unchanged from previous note.  ALLERGIES:  is allergic to tape.  MEDICATIONS:  Current Outpatient Medications  Medication Sig Dispense Refill   HYDROcodone -acetaminophen  (NORCO/VICODIN) 5-325 MG tablet Take 0.5-1 tablets by mouth every 8 (eight) hours as needed for moderate pain (pain score 4-6). 20 tablet 0   megestrol  (MEGACE  ES) 625 MG/5ML suspension Take 5 mLs (625 mg total) by  mouth daily. 150 mL 0   amLODipine (NORVASC) 5 MG tablet Take 5 mg by mouth daily.     folic acid (FOLVITE) 1 MG tablet Take 1 mg by mouth daily.     losartan (COZAAR) 50 MG tablet Take 50 mg by mouth daily.     pravastatin (PRAVACHOL) 10 MG tablet Take 10 mg by mouth daily.  3   warfarin (COUMADIN ) 2 MG tablet Take 1 tablet (2 mg total) by mouth daily at 6 PM. (Patient taking differently: Take 2 mg by mouth daily at 6 PM. Taking 2 mg on Monday and Thursday and 1.5 mg all other days of week.) 30 tablet 4   No current facility-administered medications for this visit.    PHYSICAL EXAMINATION: ECOG PERFORMANCE STATUS: 2 - Symptomatic, <50% confined to bed  Vitals:   11/29/23 1025  BP: 94/60  Pulse: (!) 115  Resp: 15  Temp: 98.8 F (37.1 C)  SpO2: 96%   Wt Readings from Last 3 Encounters:  11/29/23 86 lb 12.8 oz (39.4 kg)  11/09/23 98 lb 8 oz (44.7 kg)  10/24/23 110 lb (49.9 kg)     GENERAL:alert, no distress and comfortable SKIN: skin color, texture, turgor are normal, no rashes or significant lesions EYES: normal, Conjunctiva are pink and non-injected, sclera clear NECK: supple, thyroid  normal size, non-tender, without nodularity LYMPH:  no palpable lymphadenopathy  in the cervical, axillary  LUNGS: clear to auscultation and percussion with normal breathing effort HEART: regular rate & rhythm and no murmurs and no lower extremity edema ABDOMEN:abdomen soft, non-tender and normal bowel sounds Musculoskeletal:no cyanosis of digits and no clubbing, tenderness in the lumbar spine NEURO: alert & oriented x 3 with fluent speech, no focal motor/sensory deficits  Physical Exam   LABORATORY DATA:  I have reviewed the data as listed    Latest Ref Rng & Units 11/29/2023    9:58 AM 11/09/2023   12:57 PM 10/24/2023   11:35 AM  CBC  WBC 4.0 - 10.5 K/uL 6.7  6.1  9.6   Hemoglobin 13.0 - 17.0 g/dL 88.8  9.1  8.7   Hematocrit 39.0 - 52.0 % 34.5  29.6  28.4   Platelets 150 - 400 K/uL 439   328  480         Latest Ref Rng & Units 11/29/2023    9:58 AM 11/09/2023   12:57 PM 10/24/2023   11:35 AM  CMP  Glucose 70 - 99 mg/dL 841  881  880   BUN 8 - 23 mg/dL 33  10  16   Creatinine 0.61 - 1.24 mg/dL 8.70  8.89  8.89   Sodium 135 - 145 mmol/L 139  141  140   Potassium 3.5 - 5.1 mmol/L 4.5  4.2  4.3   Chloride 98 - 111 mmol/L 106  106  107   CO2 22 - 32 mmol/L 25  29  26    Calcium 8.9 - 10.3 mg/dL 9.3  8.8  8.9   Total Protein 6.5 - 8.1 g/dL 7.5  6.9  7.2   Total Bilirubin 0.0 - 1.2 mg/dL 0.6  0.6  1.0   Alkaline Phos 38 - 126 U/L 66  72  91   AST 15 - 41 U/L 36  25  33   ALT 0 - 44 U/L 7  8  15        RADIOGRAPHIC STUDIES: I have personally reviewed the radiological images as listed and agreed with the findings in the report. No results found.    Orders Placed This Encounter  Procedures   MR LIVER W WO CONTRAST    Standing Status:   Future    Expected Date:   11/30/2023    Expiration Date:   11/28/2024    If indicated for the ordered procedure, I authorize the administration of contrast media per Radiology protocol:   Yes    What is the patient's sedation requirement?:   No Sedation    Does the patient have a pacemaker or implanted devices?:   No    Preferred imaging location?:   [facility] (table limit - 500lbs)   MR Lumbar Spine W Wo Contrast    Standing Status:   Future    Expected Date:   11/30/2023    Expiration Date:   11/28/2024    If indicated for the ordered procedure, I authorize the administration of contrast media per Radiology protocol:   Yes    What is the patient's sedation requirement?:   No Sedation    Does the patient have a pacemaker or implanted devices?:   No    Use SRS Protocol?:   No    Preferred imaging location?:   [facility] (table limit - 500lbs)   All questions were answered. The patient knows to call the clinic with any problems, questions or concerns. No barriers to learning  was detected. The total time spent in  the appointment was 30 minutes, including review of chart and various tests results, discussions about plan of care and coordination of care plan     Onita Mattock, MD 11/29/2023

## 2023-11-29 NOTE — Telephone Encounter (Signed)
 Pt duaghter called in asking  to cancel all of pt appointment, because pt is sick today and in a lot of pain. Will call back to rescheduled pt.

## 2023-11-30 ENCOUNTER — Ambulatory Visit (HOSPITAL_COMMUNITY)
Admission: RE | Admit: 2023-11-30 | Discharge: 2023-11-30 | Disposition: A | Discharge status: Home / Self Care | Source: Ambulatory Visit | Attending: Hematology | Admitting: Hematology

## 2023-11-30 ENCOUNTER — Other Ambulatory Visit: Payer: Self-pay

## 2023-11-30 DIAGNOSIS — M51369 Other intervertebral disc degeneration, lumbar region without mention of lumbar back pain or lower extremity pain: Secondary | ICD-10-CM | POA: Diagnosis not present

## 2023-11-30 DIAGNOSIS — C220 Liver cell carcinoma: Secondary | ICD-10-CM | POA: Diagnosis not present

## 2023-11-30 DIAGNOSIS — M47816 Spondylosis without myelopathy or radiculopathy, lumbar region: Secondary | ICD-10-CM | POA: Diagnosis not present

## 2023-11-30 DIAGNOSIS — M4317 Spondylolisthesis, lumbosacral region: Secondary | ICD-10-CM | POA: Diagnosis not present

## 2023-11-30 DIAGNOSIS — R160 Hepatomegaly, not elsewhere classified: Secondary | ICD-10-CM | POA: Diagnosis not present

## 2023-11-30 DIAGNOSIS — C249 Malignant neoplasm of biliary tract, unspecified: Secondary | ICD-10-CM | POA: Diagnosis not present

## 2023-11-30 LAB — T4: T4, Total: 14 ug/dL — ABNORMAL HIGH (ref 4.5–12.0)

## 2023-11-30 MED ORDER — GADOBUTROL 1 MMOL/ML IV SOLN
4.0000 mL | Freq: Once | INTRAVENOUS | Status: AC | PRN
Start: 2023-11-30 — End: 2023-11-30
  Administered 2023-11-30: 4 mL via INTRAVENOUS

## 2023-12-01 ENCOUNTER — Inpatient Hospital Stay: Admitting: Hematology

## 2023-12-01 ENCOUNTER — Other Ambulatory Visit: Payer: Self-pay

## 2023-12-01 DIAGNOSIS — M545 Low back pain, unspecified: Secondary | ICD-10-CM | POA: Diagnosis not present

## 2023-12-01 DIAGNOSIS — C220 Liver cell carcinoma: Secondary | ICD-10-CM

## 2023-12-01 NOTE — Assessment & Plan Note (Signed)
 T2N0M0, stage II - Presented with abdominal pain, nausea and weight loss. -CT and MRI showed a large 13 cm mass in the right lobe of liver, no nodal or distant metastasis. -Baseline AFP 31.8 -Liver biopsy on Aug 03, 2023 confirmed a well-differentiated hepatocellular carcinoma. -Pt was seen by surgery  -He underwent Y90 on 10/25/2023  - Given the large size of tumor, I also recommend systemic treatment with immunotherapy, plan to start Durvalumab and Imjudo on 9/24.

## 2023-12-01 NOTE — Progress Notes (Signed)
 Faxed [facility] referral to [phone] [employer]. [place], [place] per [employer] from Dr. Lanny.  Referral packet faxed and fax confirmation received.

## 2023-12-01 NOTE — Progress Notes (Signed)
 [facility]   [phone] [phone]   Clinic Follow up Note   Patient Care Team: Morgan Drivers, MD as PCP - General (Family Medicine) Ardis Evalene CROME, RN as Oncology Nurse Navigator 12/01/2023  I connected with Isaiah York on 12/01/23 at  1:00 PM EDT by telephone and verified that I am speaking with the correct person using two identifiers.   I discussed the limitations, risks, security and privacy concerns of performing an evaluation and management service by telephone and the availability of in person appointments. I also discussed with the patient that there may be a patient responsible charge related to this service. The patient expressed understanding and agreed to proceed.   Patient's location:  Home  Provider's location:  Office    CHIEF COMPLAINT: review MRI results    CURRENT THERAPY:   Oncology history Hepatocellular carcinoma (HCC) T2N0M0, stage II - Presented with abdominal pain, nausea and weight loss. -CT and MRI showed a large 13 cm mass in the right lobe of liver, no nodal or distant metastasis. -Baseline AFP 31.8 -Liver biopsy on Aug 03, 2023 confirmed a well-differentiated hepatocellular carcinoma. -Pt was seen by surgery  -He underwent Y90 on 10/25/2023  - Given the large size of tumor, I also recommend systemic treatment with immunotherapy, plan to start Durvalumab and Imjudo on 9/24.    Assessment & Plan Liver cell carcinoma (hepatocellular carcinoma) Liver cancer has increased in size since the last scan in April, likely growing between the last scan and the start of treatment in August. Current scan does not indicate treatment failure. - Schedule immunotherapy to start in two weeks, allowing time for back pain to improve.  T11 vertebral compression fracture with acute severe back pain Compression fracture at T11 in the low thoracic spine, identified as mild and chronic. Pain is acute, located in the low thoracic  region near the waist, exacerbated by movement. MRI findings do not fully explain the severity of pain experienced. - Refer to orthopedic surgeon for further evaluation and management. - Provide contact information for orthopedic clinic and make an urgent referral. - Consider use of a back brace to alleviate pain during movement. - Adjust hydrocodone  dosage to a full tablet every six hours as needed for pain management, especially during activities.  Plan - I personally reviewed his MRI scan results, and discussed findings with patient and his daughter - I will make a urgent referral to [facility] for his T11 vertebral compression fracture with acute severe back pain - Plan to start immunotherapy in 2 weeks - Continue pain management with hydrocodone , okay to take higher dose.   SUMMARY OF ONCOLOGIC HISTORY: Oncology History  Hepatocellular carcinoma (HCC)  06/14/2023 Initial Diagnosis   Hepatocellular carcinoma (HCC)   08/03/2023 Cancer Staging   Staging form: Liver, AJCC 8th Edition - Clinical stage from 08/03/2023: Stage II (cT2, cN0, cM0) - Signed by Lanny Callander, MD on 08/07/2023   11/29/2023 -  Chemotherapy   Patient is on Treatment Plan : Hepatocellular Carcinoma Tremelimumab-actl C1 D1 + Durvalumab q28d        Discussed the use of AI scribe software for clinical note transcription with the patient, who gave verbal consent to proceed.  History of Present Illness Isaiah York is a 78 year old male with liver cancer who presents for follow-up. He is accompanied by his daughter, [name].  The liver tumor has increased in size since the last scan in April. Treatment was initiated in August, which  may explain some of the growth observed between the last scan and the start of treatment.  He experiences acute back pain localized to the low thoracic spine around the T11 vertebra, without radiation to the legs. The pain worsens with movement and position changes. Hydrocodone , taken as half  a tablet every six hours, provides minimal relief. 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     REVIEW OF SYSTEMS:   Constitutional: Denies fevers, chills or abnormal weight loss Eyes: Denies blurriness of vision Ears, nose, mouth, throat, and face: Denies mucositis or sore throat Respiratory: Denies cough, dyspnea or wheezes Cardiovascular: Denies palpitation, chest discomfort or lower extremity swelling Gastrointestinal:  Denies nausea, heartburn or change in bowel habits Skin: Denies abnormal skin rashes Lymphatics: Denies new lymphadenopathy or easy bruising Neurological:Denies numbness, tingling or new weaknesses Behavioral/Psych: Mood is stable, no new changes  All other systems were reviewed with the patient and are negative.  MEDICAL HISTORY:  Past Medical History:  Diagnosis Date   DVT (deep venous thrombosis) (HCC)    HTN (hypertension)    Hyperlipidemia     SURGICAL HISTORY: Past Surgical History:  Procedure Laterality Date   IR ANGIOGRAM SELECTIVE EACH ADDITIONAL VESSEL  10/10/2023   IR ANGIOGRAM SELECTIVE EACH ADDITIONAL VESSEL  10/10/2023   IR ANGIOGRAM SELECTIVE EACH ADDITIONAL VESSEL  10/24/2023   IR ANGIOGRAM SELECTIVE EACH ADDITIONAL VESSEL  10/24/2023   IR ANGIOGRAM VISCERAL SELECTIVE  10/10/2023   IR ANGIOGRAM VISCERAL SELECTIVE  10/24/2023   IR EMBO TUMOR ORGAN ISCHEMIA INFARCT INC GUIDE ROADMAPPING  10/24/2023   IR RADIOLOGIST EVAL & MGMT  08/25/2023   IR US  GUIDE VASC ACCESS RIGHT  10/10/2023   IR US  GUIDE VASC ACCESS RIGHT  10/24/2023    I have reviewed the social history and family history with the patient and they are unchanged from previous note.  ALLERGIES:  is allergic to tape.  MEDICATIONS:  Current Outpatient Medications  Medication Sig Dispense Refill   amLODipine (NORVASC) 5 MG tablet Take 5 mg by mouth daily.     folic acid (FOLVITE) 1 MG tablet Take 1 mg by mouth daily.     HYDROcodone -acetaminophen  (NORCO/VICODIN)  5-325 MG tablet Take 0.5-1 tablets by mouth every 8 (eight) hours as needed for moderate pain (pain score 4-6). 20 tablet 0   losartan (COZAAR) 50 MG tablet Take 50 mg by mouth daily.     megestrol  (MEGACE  ES) 625 MG/5ML suspension Take 5 mLs (625 mg total) by mouth daily. 150 mL 0   pravastatin (PRAVACHOL) 10 MG tablet Take 10 mg by mouth daily.  3   warfarin (COUMADIN ) 2 MG tablet Take 1 tablet (2 mg total) by mouth daily at 6 PM. (Patient taking differently: Take 2 mg by mouth daily at 6 PM. Taking 2 mg on Monday and Thursday and 1.5 mg all other days of week.) 30 tablet 4   No current facility-administered medications for this visit.    PHYSICAL EXAMINATION: Not performed   LABORATORY DATA:  I have reviewed the data as listed    Latest Ref Rng & Units 11/29/2023    9:58 AM 11/09/2023   12:57 PM 10/24/2023   11:35 AM  CBC  WBC 4.0 - 10.5 K/uL 6.7  6.1  9.6   Hemoglobin 13.0 - 17.0 g/dL 88.8  9.1  8.7   Hematocrit 39.0 - 52.0 % 34.5  29.6  28.4   Platelets 150 - 400 K/uL 439  328  480         Latest Ref Rng & Units 11/29/2023    9:58 AM 11/09/2023   12:57 PM 10/24/2023   11:35 AM  CMP  Glucose 70 - 99 mg/dL 841  881  880   BUN 8 - 23 mg/dL 33  10  16   Creatinine 0.61 - 1.24 mg/dL 8.70  8.89  8.89   Sodium 135 - 145 mmol/L 139  141  140   Potassium 3.5 - 5.1 mmol/L 4.5  4.2  4.3   Chloride 98 - 111 mmol/L 106  106  107   CO2 22 - 32 mmol/L 25  29  26    Calcium 8.9 - 10.3 mg/dL 9.3  8.8  8.9   Total Protein 6.5 - 8.1 g/dL 7.5  6.9  7.2   Total Bilirubin 0.0 - 1.2 mg/dL 0.6  0.6  1.0   Alkaline Phos 38 - 126 U/L 66  72  91   AST 15 - 41 U/L 36  25  33   ALT 0 - 44 U/L 7  8  15        RADIOGRAPHIC STUDIES: I have personally reviewed the radiological images as listed and agreed with the findings in the report. MR Lumbar Spine W Wo Contrast Result Date: 12/01/2023 CLINICAL DATA:  Provided history: Hepatocellular carcinoma. Additional history provided: Severe back pain  radiating to stomach, spine tenderness, lumbar vertebral compression fracture. EXAM: MRI LUMBAR SPINE WITHOUT AND WITH CONTRAST TECHNIQUE: Multiplanar and multiecho pulse sequences of the lumbar spine were obtained without and with intravenous contrast. CONTRAST:  4mL GADAVIST  GADOBUTROL  1 MMOL/ML IV SOLN COMPARISON:  CT abdomen/pelvis 05/19/2023. CT angiogram abdomen 09/07/2023. FINDINGS: Segmentation: 5 lumbar vertebrae. The caudal most well-formed intervertebral disc space is designated L5-S1. Alignment:  3 mm L5-S1 grade 1 anterolisthesis. Vertebrae: Mild chronic T11 inferior endplate compression deformity/Schmorl node. Lumbar vertebral body height is maintained. No significant marrow edema or focal worrisome marrow lesion within the lumbar spine. Conus medullaris and cauda equina: Conus extends to the L1-L2 level. No signal abnormality identified within the visualized distal spinal cord. Paraspinal and other soft tissues: Please see same day abdominal MRI for a description of hepatic findings. Known 3.2 cm infrarenal abdominal aortic aneurysm. No paraspinal mass or collection. Disc levels: Mild-to-moderate disc degeneration at L5-S1. No more than mild disc degeneration at the remaining levels. T12-L1: No significant disc herniation or stenosis. L1-L2: No significant disc herniation or stenosis. L2-L3: No significant disc herniation or stenosis. L3-L4: Mild facet hypertrophy. No significant disc herniation or stenosis. L4-L5: Small disc bulge. Facet hypertrophy. Mild ligamentum flavum hypertrophy. No significant spinal canal or foraminal stenosis. L5-S1: 3 mm grade 1 anterolisthesis. Disc bulge. Endplate spurring along the left aspect of the disc space. Mild-to-moderate facet arthropathy on the left. No significant spinal canal stenosis. Mild-to-moderate left neural foraminal narrowing. IMPRESSION: 1. Mild chronic T11 inferior endplate vertebral compression deformity/Schmorl node. 2. No evidence of osseous  metastatic disease within the lumbar spine. 3. No lumbar vertebral compression fracture. 4. Lumbar spondylosis as outlined within the body of the report. No significant spinal canal stenosis. Multifactorial mild-to-moderate left neural foraminal at L5-S1. Multilevel disc degeneration (greatest at L5-S1). Multilevel facet degeneration. 5. 3 mm L5-S1 grade 1 anterolisthesis. 6. Please see same day abdominal MRI for a description of hepatic findings. 7. Known 3.2 cm infrarenal abdominal aortic aneurysm. Please refer to the prior CT angiogram of the abdomen performed 10/05/2023 for further description and for follow-up recommendations.  Electronically Signed   By: Rockey Childs D.O.   On: 12/01/2023 09:25   MR LIVER W WO CONTRAST Result Date: 11/30/2023 CLINICAL DATA:  Gallbladder/biliary cancer, assess treatment response. EXAM: MRI ABDOMEN WITHOUT AND WITH CONTRAST TECHNIQUE: Multiplanar multisequence MR imaging of the abdomen was performed both before and after the administration of intravenous contrast. CONTRAST:  4mL GADAVIST  GADOBUTROL  1 MMOL/ML IV SOLN COMPARISON:  CT angiography abdomen from 10/05/2023 and MRI abdomen from 06/13/2023. FINDINGS: Lower chest: Unremarkable MR appearance to the lung bases. No pleural effusion. No pericardial effusion. Normal heart size. Hepatobiliary: There is mild-to-moderate hepatomegaly. Noncirrhotic liver configuration. Redemonstration of a heterogeneous, enhancing well-circumscribed mass predominantly nearly completely involving the right hepatic lobe measuring 12.4 x 15.0 cm on today's exam, which previously measured up to 12.0 x 14.4 cm, when remeasured in similar fashion. The lesion exhibits arterial hyperenhancement and demonstrate washout on the delayed/equilibrium phase images. There are multiple T1 hyperattenuating and nonenhancing areas within, which may represent hemorrhagic products. The lesion exerts moderate external mass effect over the right portal vein and its  tributaries which are otherwise patent. The rest of the liver is within normal limits. No intrahepatic or extrahepatic bile duct dilatation. No choledocholithiasis. Unremarkable gallbladder. Pancreas: No mass, inflammatory changes or other parenchymal abnormality identified. No main pancreatic duct dilation. Spleen:  Within normal limits in size and appearance. No focal mass. Adrenals/Urinary Tract: Unremarkable adrenal glands. No hydroureteronephrosis. No suspicious renal mass. Stomach/Bowel: Visualized portions within the abdomen are unremarkable. No disproportionate dilation of bowel loops. Vascular/Lymphatic: No pathologically enlarged lymph nodes identified. No abdominal aortic aneurysm demonstrated. No ascites. Other:  None. Musculoskeletal: No suspicious bone lesions identified. IMPRESSION: 1. Redemonstration of large right hepatic lobe mass, which is slightly increased in size since the prior exam. This is compatible with hepatocellular carcinoma. No new lesion seen. No metastatic disease identified in the abdomen. 2. Multiple other nonacute observations, as described above. Electronically Signed   By: Ree Molt M.D.   On: 11/30/2023 17:53       I discussed the assessment and treatment plan with the patient. The patient was provided an opportunity to ask questions and all were answered. The patient agreed with the plan and demonstrated an understanding of the instructions.   The patient was advised to call back or seek an in-person evaluation if the symptoms worsen or if the condition fails to improve as anticipated.  I provided 25 minutes of non face-to-face telephone visit time during this encounter, including review of chart and various tests results, discussions about plan of care and coordination of care plan.    Onita Mattock, MD 12/01/23

## 2023-12-12 ENCOUNTER — Encounter: Payer: Self-pay | Admitting: Hematology

## 2023-12-13 ENCOUNTER — Encounter: Payer: Self-pay | Admitting: Hematology

## 2024-01-06 DEATH — deceased

## 2024-01-24 ENCOUNTER — Encounter: Payer: Self-pay | Admitting: Oncology
